# Patient Record
Sex: Male | Born: 1945 | Race: White | Hispanic: No | Marital: Married | Smoking: Never smoker
Health system: Southern US, Community
[De-identification: ages and names within clinical notes are randomized; demographics above are authoritative.]

## PROBLEM LIST (undated history)

## (undated) DIAGNOSIS — E039 Hypothyroidism, unspecified: Secondary | ICD-10-CM

## (undated) DIAGNOSIS — I2510 Atherosclerotic heart disease of native coronary artery without angina pectoris: Secondary | ICD-10-CM

## (undated) DIAGNOSIS — K219 Gastro-esophageal reflux disease without esophagitis: Secondary | ICD-10-CM

## (undated) DIAGNOSIS — I10 Essential (primary) hypertension: Secondary | ICD-10-CM

## (undated) DIAGNOSIS — M1990 Unspecified osteoarthritis, unspecified site: Secondary | ICD-10-CM

## (undated) DIAGNOSIS — E785 Hyperlipidemia, unspecified: Secondary | ICD-10-CM

## (undated) DIAGNOSIS — R42 Dizziness and giddiness: Secondary | ICD-10-CM

## (undated) DIAGNOSIS — F419 Anxiety disorder, unspecified: Secondary | ICD-10-CM

## (undated) DIAGNOSIS — M5126 Other intervertebral disc displacement, lumbar region: Secondary | ICD-10-CM

## (undated) DIAGNOSIS — E119 Type 2 diabetes mellitus without complications: Secondary | ICD-10-CM

## (undated) DIAGNOSIS — H269 Unspecified cataract: Secondary | ICD-10-CM

## (undated) HISTORY — DX: Anxiety disorder, unspecified: F41.9

## (undated) HISTORY — PX: KNEE ARTHROSCOPY: SUR90

## (undated) HISTORY — DX: Atherosclerotic heart disease of native coronary artery without angina pectoris: I25.10

## (undated) HISTORY — DX: Dizziness and giddiness: R42

## (undated) HISTORY — DX: Unspecified osteoarthritis, unspecified site: M19.90

## (undated) HISTORY — DX: Type 2 diabetes mellitus without complications: E11.9

## (undated) HISTORY — DX: Hyperlipidemia, unspecified: E78.5

## (undated) HISTORY — DX: Essential (primary) hypertension: I10

## (undated) HISTORY — PX: COLONOSCOPY: SHX174

## (undated) HISTORY — DX: Unspecified cataract: H26.9

## (undated) HISTORY — DX: Other intervertebral disc displacement, lumbar region: M51.26

## (undated) HISTORY — DX: Gastro-esophageal reflux disease without esophagitis: K21.9

## (undated) HISTORY — PX: POLYPECTOMY: SHX149

## (undated) HISTORY — PX: CATARACT EXTRACTION: SUR2

---

## 2005-09-26 ENCOUNTER — Encounter: Payer: Self-pay | Admitting: Cardiology

## 2008-09-28 ENCOUNTER — Encounter: Payer: Self-pay | Admitting: Cardiology

## 2008-10-28 ENCOUNTER — Ambulatory Visit: Payer: Self-pay | Admitting: Cardiology

## 2008-10-28 DIAGNOSIS — I10 Essential (primary) hypertension: Secondary | ICD-10-CM | POA: Insufficient documentation

## 2008-10-28 DIAGNOSIS — E7800 Pure hypercholesterolemia, unspecified: Secondary | ICD-10-CM

## 2008-10-28 DIAGNOSIS — E785 Hyperlipidemia, unspecified: Secondary | ICD-10-CM

## 2008-10-28 DIAGNOSIS — K219 Gastro-esophageal reflux disease without esophagitis: Secondary | ICD-10-CM

## 2008-10-28 DIAGNOSIS — R072 Precordial pain: Secondary | ICD-10-CM | POA: Insufficient documentation

## 2008-10-28 DIAGNOSIS — I2510 Atherosclerotic heart disease of native coronary artery without angina pectoris: Secondary | ICD-10-CM | POA: Insufficient documentation

## 2008-11-08 ENCOUNTER — Ambulatory Visit: Payer: Self-pay | Admitting: Cardiology

## 2008-11-08 LAB — CONVERTED CEMR LAB
BUN: 13 mg/dL (ref 6–23)
Calcium: 9.2 mg/dL (ref 8.4–10.5)
Creatinine, Ser: 1 mg/dL (ref 0.4–1.5)
Eosinophils Relative: 3 % (ref 0.0–5.0)
GFR calc non Af Amer: 79.79 mL/min (ref >60)
HCT: 41 % (ref 39.0–52.0)
INR: 1.1 — ABNORMAL HIGH (ref 0.8–1.0)
Lymphocytes Relative: 34.8 % (ref 12.0–46.0)
Monocytes Relative: 11 % (ref 3.0–12.0)
Neutrophils Relative %: 50.2 % (ref 43.0–77.0)
Platelets: 245 10*3/uL (ref 150.0–400.0)
Potassium: 4.1 meq/L (ref 3.5–5.1)
Prothrombin Time: 11.8 s (ref 10.9–13.3)
WBC: 5.6 10*3/uL (ref 4.5–10.5)

## 2008-11-11 ENCOUNTER — Ambulatory Visit: Payer: Self-pay | Admitting: Cardiology

## 2008-11-11 ENCOUNTER — Inpatient Hospital Stay (HOSPITAL_BASED_OUTPATIENT_CLINIC_OR_DEPARTMENT_OTHER): Admission: RE | Admit: 2008-11-11 | Discharge: 2008-11-11 | Payer: Self-pay | Admitting: Cardiology

## 2008-11-11 HISTORY — PX: CARDIAC CATHETERIZATION: SHX172

## 2008-11-30 ENCOUNTER — Ambulatory Visit: Payer: Self-pay | Admitting: Cardiology

## 2008-11-30 DIAGNOSIS — R42 Dizziness and giddiness: Secondary | ICD-10-CM

## 2008-12-14 ENCOUNTER — Ambulatory Visit: Payer: Self-pay | Admitting: Cardiology

## 2008-12-14 ENCOUNTER — Encounter (INDEPENDENT_AMBULATORY_CARE_PROVIDER_SITE_OTHER): Payer: Self-pay

## 2008-12-14 ENCOUNTER — Encounter: Payer: Self-pay | Admitting: Cardiology

## 2008-12-14 ENCOUNTER — Ambulatory Visit: Payer: Self-pay

## 2009-01-13 ENCOUNTER — Ambulatory Visit: Payer: Self-pay | Admitting: Cardiology

## 2010-08-11 ENCOUNTER — Inpatient Hospital Stay (HOSPITAL_COMMUNITY): Payer: Self-pay

## 2010-08-19 ENCOUNTER — Inpatient Hospital Stay (HOSPITAL_COMMUNITY): Payer: Self-pay

## 2010-08-28 LAB — POCT I-STAT 3, VENOUS BLOOD GAS (G3P V)
pCO2, Ven: 41.5 mmHg — ABNORMAL LOW (ref 45.0–50.0)
pH, Ven: 7.34 — ABNORMAL HIGH (ref 7.250–7.300)

## 2010-08-28 LAB — POCT I-STAT 3, ART BLOOD GAS (G3+)
Bicarbonate: 23.6 mEq/L (ref 20.0–24.0)
pH, Arterial: 7.383 (ref 7.350–7.450)
pO2, Arterial: 59 mmHg — ABNORMAL LOW (ref 80.0–100.0)

## 2010-10-03 NOTE — Cardiovascular Report (Signed)
NAMEHERBIE, LEHRMANN              ACCOUNT NO.:  [id number]   MEDICAL RECORD NO.:  [id number]          PATIENT TYPE:  OIB   LOCATION:  1962                         FACILITY:  [facility]   PHYSICIAN:  Peter Beals. 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  He had been evaluated in [place] for chest pain and had a coronary angiogram which showed mild  nonobstructive disease.  This was done in 2008.  He recently had  recurrent episodes of chest pain and he initially saw Dr. Edmonia James and  decided not proceed further evaluation at that time, but then had  recurrence of symptoms and came to [place] for further evaluation.   PROCEDURE:  The procedure was performed by the right femoral artery,  arterial sheath, and 4-French preformed coronary catheters.  We used an  FL-5 catheter for the left coronary artery.  The patient tolerated the  procedure well and left the laboratory in satisfactory condition.  Right  heart catheterization was performed percutaneously through right femoral  vein using a venous sheath and Swan-Ganz thermodilution catheter.   RESULTS:  The left main coronary artery:  The left main coronary artery  is free of significant disease.   The left anterior descending artery:  The left anterior descending  artery gave rise to 2 diagonal branches and a septal perforator.  The  LAD was irregular and there was 40% narrowing in the proximal vessel and  30% narrowing in the mid vessel.  The vessel terminated before the apex.   The circumflex artery:  The circumflex artery is a fairly large vessel  gave rise to a small marginal branch, an atrial branch, a second  marginal branch, and 2 posterolateral branches.  This vessel was  irregular, but has no major  obstruction.   The right coronary artery:  The right coronary artery is a moderate-  sized vessel gave rise to a right ventricular branch, a posterior  descending branch, and a posterolateral branch.  This vessel was  irregular, but had no significant obstruction.   The left ventriculogram:  The left ventriculogram performed in the RAO  projection showed good wall motion with no areas of hypokinesis.  Estimated ejection fraction of 60%.   HEMODYNAMIC DATA:  The right atrial pressure was 4 mean.  The pulmonary  artery pressure was 27/9 with a mean of 16.  The pulmonary wedge  pressure was 9 mean.  Left ventricular pressure was 122/13.  The aortic  pressure was 122/68 with a mean of 90.  Cardiac output/cardiac index was  6.9/2.8 L/min/m2 by Fick.   CONCLUSIONS:  1. Nonobstructive coronary artery disease with 40% narrowing in the      proximal and 30% narrowing in the mid LAD, irregularities in the      circumflex and right coronary artery, and normal LV function.  2. Normal right-sided pressures.   RECOMMENDATIONS:  The patient has  no source of ischemia.  The etiology  of his recent chest pain and shortness breath is not clear.  We may  consider evaluate him further for pulmonary embolism in view of his  embolism since his symptoms were fairly impressive.  I will also query  him regarding symptoms of reflux to see if this might be a possibility  and we will consider whether we should give a trial of PPI.  I will see  him back in follow-up in a couple of weeks.      Bruce Elvera Lennox Juanda Chance, MD, [facility]  Electronically Signed     BRB/MEDQ  D:  11/11/2008  T:  11/12/2008  Job:  [id number]   cc:   Francisca December, M.D.

## 2011-08-08 ENCOUNTER — Observation Stay (HOSPITAL_COMMUNITY)
Admission: AD | Admit: 2011-08-08 | Discharge: 2011-08-09 | DRG: 313 | Disposition: A | Discharge status: Home / Self Care | Payer: Medicare Other | Source: Other Acute Inpatient Hospital | Attending: Cardiology | Admitting: Cardiology

## 2011-08-08 DIAGNOSIS — K219 Gastro-esophageal reflux disease without esophagitis: Secondary | ICD-10-CM | POA: Insufficient documentation

## 2011-08-08 DIAGNOSIS — E782 Mixed hyperlipidemia: Secondary | ICD-10-CM | POA: Insufficient documentation

## 2011-08-08 DIAGNOSIS — I10 Essential (primary) hypertension: Secondary | ICD-10-CM | POA: Insufficient documentation

## 2011-08-08 DIAGNOSIS — I2510 Atherosclerotic heart disease of native coronary artery without angina pectoris: Secondary | ICD-10-CM | POA: Insufficient documentation

## 2011-08-08 DIAGNOSIS — R0789 Other chest pain: Principal | ICD-10-CM | POA: Insufficient documentation

## 2011-08-08 DIAGNOSIS — R42 Dizziness and giddiness: Secondary | ICD-10-CM | POA: Insufficient documentation

## 2011-08-08 DIAGNOSIS — R079 Chest pain, unspecified: Secondary | ICD-10-CM | POA: Diagnosis present

## 2011-08-08 DIAGNOSIS — E7800 Pure hypercholesterolemia, unspecified: Secondary | ICD-10-CM | POA: Insufficient documentation

## 2011-08-08 HISTORY — DX: Hypothyroidism, unspecified: E03.9

## 2011-08-09 ENCOUNTER — Other Ambulatory Visit: Payer: Self-pay

## 2011-08-09 ENCOUNTER — Encounter (HOSPITAL_COMMUNITY): Payer: Self-pay | Admitting: *Deleted

## 2011-08-09 DIAGNOSIS — R079 Chest pain, unspecified: Secondary | ICD-10-CM

## 2011-08-09 DIAGNOSIS — R42 Dizziness and giddiness: Secondary | ICD-10-CM

## 2011-08-09 LAB — PROTIME-INR: Prothrombin Time: 15.1 seconds (ref 11.6–15.2)

## 2011-08-09 LAB — CARDIAC PANEL(CRET KIN+CKTOT+MB+TROPI)
CK, MB: 1.8 ng/mL (ref 0.3–4.0)
CK, MB: 1.8 ng/mL (ref 0.3–4.0)
Total CK: 82 U/L (ref 7–232)
Troponin I: <0.3 ng/mL (ref <0.30)

## 2011-08-09 LAB — LIPID PANEL
Cholesterol: 129 mg/dL (ref 0–200)
LDL Cholesterol: 72 mg/dL (ref 0–99)
Total CHOL/HDL Ratio: 3.6 RATIO
Triglycerides: 104 mg/dL (ref <150)
VLDL: 21 mg/dL (ref 0–40)

## 2011-08-09 LAB — BASIC METABOLIC PANEL
CO2: 24 mEq/L (ref 19–32)
GFR calc non Af Amer: 89 mL/min — ABNORMAL LOW (ref >90)
Glucose, Bld: 117 mg/dL — ABNORMAL HIGH (ref 70–99)

## 2011-08-09 LAB — CBC
Hemoglobin: 13.7 g/dL (ref 13.0–17.0)
MCH: 29 pg (ref 26.0–34.0)
MCV: 87.1 fL (ref 78.0–100.0)
Platelets: 229 10*3/uL (ref 150–400)
RBC: 4.73 MIL/uL (ref 4.22–5.81)
WBC: 7.6 10*3/uL (ref 4.0–10.5)

## 2011-08-09 MED ORDER — ACETAMINOPHEN 325 MG PO TABS: 650.0000 mg | ORAL_TABLET | Freq: Four times a day (QID) | ORAL | Status: DC | PRN

## 2011-08-09 MED ORDER — LEVOTHYROXINE SODIUM 50 MCG PO TABS
50.0000 ug | ORAL_TABLET | Freq: Every day | ORAL | Status: DC
  Administered 2011-08-09: 50 ug via ORAL
  Filled 2011-08-09 (×2): qty 1

## 2011-08-09 MED ORDER — SODIUM CHLORIDE 0.9 % IJ SOLN
3.0000 mL | Freq: Two times a day (BID) | INTRAMUSCULAR | Status: DC
  Administered 2011-08-09 (×2): 3 mL via INTRAVENOUS

## 2011-08-09 MED ORDER — SIMVASTATIN 40 MG PO TABS
40.0000 mg | ORAL_TABLET | Freq: Every day | ORAL | Status: DC
  Filled 2011-08-09: qty 1

## 2011-08-09 MED ORDER — SODIUM CHLORIDE 0.9 % IV SOLN
INTRAVENOUS | Status: AC
  Administered 2011-08-09: 01:00:00 via INTRAVENOUS

## 2011-08-09 MED ORDER — ONDANSETRON HCL 4 MG/2ML IJ SOLN: 4.0000 mg | Freq: Four times a day (QID) | INTRAMUSCULAR | Status: DC | PRN

## 2011-08-09 MED ORDER — ASPIRIN EC 81 MG PO TBEC
81.0000 mg | DELAYED_RELEASE_TABLET | Freq: Every day | ORAL | Status: DC
  Administered 2011-08-09: 81 mg via ORAL
  Filled 2011-08-09: qty 1

## 2011-08-09 MED ORDER — ENOXAPARIN SODIUM 40 MG/0.4ML ~~LOC~~ SOLN
40.0000 mg | SUBCUTANEOUS | Status: DC
  Filled 2011-08-09: qty 0.4

## 2011-08-09 NOTE — Discharge Summary (Signed)
See note from this am. cdm

## 2011-08-09 NOTE — Progress Notes (Signed)
08/09/2011 10:53 AM Nursing Note Discharge avs form, medications already taken today and those due this evening given and explained to patient and family member. Follow up appointments and when to cal the doctor discussed. D/v iv line. D/c tele. D/c home per orders.  Rhyann Berton, Blanchard Kelch

## 2011-08-09 NOTE — Progress Notes (Signed)
    SUBJECTIVE: No chest pain or SOB. No events. No dizziness. NSR on tele.  BP 103/62  Pulse 62  Temp(Src) 97.1 F (36.2 C) (Oral)  Resp 20  Ht 6\' 1"  (1.854 m)  Wt 243 lb 11.2 oz (110.542 kg)  BMI 32.15 kg/m2  SpO2 95%  Intake/Output Summary (Last 24 hours) at 08/09/11 0730 Last data filed at 08/09/11 0030  Gross per 24 hour  Intake      3 ml  Output      0 ml  Net      3 ml    PHYSICAL EXAM General: Well developed, well nourished, in no acute distress. Alert and oriented x 3.  Psych:  Good affect, responds appropriately Neck: No JVD. No masses noted.  Lungs: Clear bilaterally with no wheezes or rhonci noted.  Heart: RRR with no murmurs noted. Abdomen: Bowel sounds are present. Soft, non-tender.  Extremities: No lower extremity edema.   LABS: BMET, CBC reviewed and WNL  Cardiac Enzymes:  Basename 08/09/11 0055  CKTOTAL 82  CKMB 1.8  CKMBINDEX --  TROPONINI <0.30     Current Meds:    . aspirin EC  81 mg Oral Daily  . enoxaparin  40 mg Subcutaneous Q24H  . levothyroxine  50 mcg Oral QAC breakfast  . simvastatin  40 mg Oral q1800  . sodium chloride  3 mL Intravenous Q12H     ASSESSMENT AND PLAN:  1. Chest pain: Atypical. Cardiac enzymes negative. Pt has history of mild CAD by cath in 2010. I do not think this represents unstable angina.  He has had similar episodes of dizziness in 2010 at which time event monitor showed only sinus rhythm. Echo at that time with normal LV function, no valvular disease and only mild left atrial enlargement. He feels well this am. No dizziness. Will get breakfast tray, ambulate. D/C home later in am if ok. He can f/u with me in 2-3 weeks.   2. HTN: BP better this am. ? Elevated yesterday due to stress. Will not start anti-hypertensive agent at this time. He will follow at home several times daily and let me know if elevated.   Joh Rao  3/21/20137:30 AM

## 2011-08-09 NOTE — Discharge Summary (Signed)
CARDIOLOGY DISCHARGE SUMMARY   Patient ID: Peter Brooks MRN: [id number] DOB/AGE: 08/02/1945 66 y.o.  Admit date: 08/08/2011 Discharge date: 08/09/2011  Primary Discharge Diagnosis:  Lightheadedness and chest pain Secondary Discharge Diagnosis:  Patient Active Problem List  Diagnoses  . HYPERCHOLESTEROLEMIA  . HYPERLIPIDEMIA-MIXED  . HYPERTENSION, BENIGN  . HYPERTENSION  . CAD  . GERD  . DIZZINESS  . CHEST PAIN-PRECORDIAL  . Chest pain   Hospital Course: Mr Serena is a 66 year old male with a history of non-obstructive CAD by cath in 2010. He had 2 episodes of light-headedness as well as some chest pain. He went to [facility] where he was treated with ASA, NTG and BB. A head CT was negative. He was transferred to [facility] for further evaluation and treatment.  His cardiac enzymes were negative for MI. He had no significant arrhythmias overnight. He had no other significant lab abnormalities. He had no more lightheadedness.   On 08/09/2011, he was evaluated by Dr Clifton James. If he ambulates without difficulty, he is considered stable for discharge, to follow up as an outpatient.  Labs:   Lab Results  Component Value Date   WBC 7.6 08/09/2011   HGB 13.7 08/09/2011   HCT 41.2 08/09/2011   MCV 87.1 08/09/2011   PLT 229 08/09/2011    Lab 08/09/11 0710  NA 140  K 4.2  CL 106  CO2 24  BUN 12  CREATININE 0.83  CALCIUM 9.5  PROT --  BILITOT --  ALKPHOS --  ALT --  AST --  GLUCOSE 117*    Basename 08/09/11 0710 08/09/11 0055  CKTOTAL 111 82  CKMB 1.8 1.8  CKMBINDEX -- --  TROPONINI <0.30 <0.30   Lipid Panel     Component Value Date/Time   CHOL 129 08/09/2011 0710   TRIG 104 08/09/2011 0710   HDL 36* 08/09/2011 0710   CHOLHDL 3.6 08/09/2011 0710   VLDL 21 08/09/2011 0710   LDLCALC 72 08/09/2011 0710    Basename 08/09/11 0710  INR 1.17     EKG:09-Aug-2011 05:30:20  Normal sinus rhythm RSR' or QR pattern in V1 suggests right ventricular conduction  delay Vent. rate 63 BPM PR interval 170 ms QRS duration 124 ms QT/QTc 448/458 ms P-R-T axes 48 -15 33  FOLLOW UP PLANS AND APPOINTMENTS Discharge Orders    Future Appointments: Provider: Department: Dept Phone: Center:   08/22/2011 10:10 AM Beatrice Lecher, PA [facility] [phone] [facility]     Future Orders Please Complete By Expires   Diet - low sodium heart healthy      Increase activity slowly        No Known Allergies Medication List  As of 08/09/2011  9:42 AM   TAKE these medications         aspirin EC 81 MG tablet   Take 81 mg by mouth daily.      CALCIUM/C/D PO   Take 1 tablet by mouth daily.      levothyroxine 50 MCG tablet   Commonly known as: SYNTHROID, LEVOTHROID   Take 50 mcg by mouth daily.      mulitivitamin with minerals Tabs   Take 1 tablet by mouth daily.      omeprazole 20 MG capsule   Commonly known as: PRILOSEC   Take 20 mg by mouth daily.      simvastatin 20 MG tablet   Commonly known as: ZOCOR   Take 20 mg by mouth every evening.  Follow-up Information    Follow up with Tereso Newcomer, PA. (See for Dr Clifton James on April 3rd at  10:10 am)    Contact information:   [address] [place] [address] [phone]          BRING ALL MEDICATIONS WITH YOU TO FOLLOW UP APPOINTMENTS  Time spent with patient to include physician time: 33 min Signed: Theodore Demark 08/09/2011, 9:42 AM Co-Sign MD

## 2011-08-09 NOTE — H&P (Signed)
Cardiology History and Physical  No primary provider on file.  History of Present Illness (and review of medical records): Peter Brooks is a 66 y.o. male who presents for evaluation of chest pain as a transfer from [facility].  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  He had no frank syncope.  He took BP at home which was elevated.  He is usually normotensive and not treated for HTN.  He also reported chest pain which he describes as twinges on left side of chest.  This has been going on intermittently for several months now.  No alleviation or aggravating factors.  Pain is very sporadic and lasting few seconds.  At [facility] ER he had neg trp, and given ASA, NTG, and lopressor for SBP 190s.  He also had CT head which was negative prior to transfer for further management.  Previous diagnostic testing for coronary artery disease includes:  Cardiac Cath 2010 1. Nonobstructive coronary artery disease with 40% narrowing in the  proximal and 30% narrowing in the mid LAD, irregularities in the  circumflex and right coronary artery, and normal LV function.  2. Normal right-sided pressures.   Review of Systems Further review of systems was otherwise negative other than stated in HPI.  Patient Active Problem List  Diagnoses Date Noted  . Chest pain 08/09/2011  . DIZZINESS 11/30/2008  . HYPERCHOLESTEROLEMIA 10/28/2008  . HYPERLIPIDEMIA-MIXED 10/28/2008  . HYPERTENSION, BENIGN 10/28/2008  . HYPERTENSION 10/28/2008  . CAD 10/28/2008  . GERD 10/28/2008  . CHEST PAIN-PRECORDIAL 10/28/2008   No past medical history on file.  No past surgical history on file.  No prescriptions prior to admission   Allergies not on file  History  Substance Use Topics  . Smoking status: Not on file  . Smokeless tobacco: Not on file  . Alcohol Use: Not on file    No family history on file.   Objective: Blood pressure 103/62, pulse 62, temperature 97.1 F  (36.2 C), temperature source Oral, resp. rate 20, height 6\' 1"  (1.854 m), weight 110.542 kg (243 lb 11.2 oz), SpO2 95.00%.  General Appearance:    Alert, cooperative, no distress, appears stated age  Head:    Normocephalic, without obvious abnormality, atraumatic  Eyes:     PERRL, EOMI, anicteric sclerae  Neck:   Supple, no carotid bruit or JVD  Lungs:     Clear to auscultation bilaterally, respirations unlabored  Heart:    Regular rate and rhythm, S1 and S2 normal, no murmur  Abdomen:     Soft, non-tender, normoactive bowel sounds  Extremities:   Extremities normal, atraumatic, no cyanosis or edema  Pulses:   2+ and symmetric all extremities  Skin:   no rashes or lesions  Neurologic:   No focal deficits. AAO x3  ECG:   Sinus rhythm HR 72, nonspecific intraventricular block, no significant change from prior  Assessment: Chest pain, atypical in 71 male with known nonobstructive ASCAD as of 2010.   Plan:  1. Admit to Cardiology, Telemetry Unit 2. Repeat ekg on admit, prn chest pain or arrythmia 3. Trend cardiac biomarkers, check lipids, hgba1c, tsh 4. Medical management to include ASA, Statin, NTG prn, Add BB if needed for BP 5. NPO for possible stress in am if enzymes negative, patient agreeable to proceed as planned

## 2011-08-09 NOTE — Progress Notes (Signed)
   CARE MANAGEMENT NOTE 08/09/2011  Patient:  Peter Brooks, Peter Brooks   Account Number:  [id number]  Date Initiated:  08/09/2011  Documentation initiated by:  GRAVES-BIGELOW,Drena Ham  Subjective/Objective Assessment:   Pt admitted with cp and lightheadedness. No needs assessed by CM at this time. Pt d/c home no needs assessed.     Action/Plan:   Anticipated DC Date:  08/09/2011   Anticipated DC Plan:  HOME/SELF CARE         Choice offered to / List presented to:             Status of service:  Completed, signed off Medicare Important Message given?   (If response is "NO", the following Medicare IM given date fields will be blank) Date Medicare IM given:   Date Additional Medicare IM given:    Discharge Disposition:  HOME/SELF CARE  Per UR Regulation:    If discussed at Long Length of Stay Meetings, dates discussed:    Comments:

## 2011-08-20 ENCOUNTER — Encounter: Payer: Self-pay | Admitting: *Deleted

## 2011-08-22 ENCOUNTER — Ambulatory Visit (INDEPENDENT_AMBULATORY_CARE_PROVIDER_SITE_OTHER): Payer: Medicare Other | Admitting: Physician Assistant

## 2011-08-22 ENCOUNTER — Encounter: Payer: Self-pay | Admitting: Physician Assistant

## 2011-08-22 VITALS — BP 132/87 | HR 62 | Resp 18 | Ht 73.0 in | Wt 253.8 lb

## 2011-08-22 DIAGNOSIS — E785 Hyperlipidemia, unspecified: Secondary | ICD-10-CM

## 2011-08-22 DIAGNOSIS — R0683 Snoring: Secondary | ICD-10-CM

## 2011-08-22 DIAGNOSIS — R42 Dizziness and giddiness: Secondary | ICD-10-CM

## 2011-08-22 DIAGNOSIS — R0609 Other forms of dyspnea: Secondary | ICD-10-CM

## 2011-08-22 DIAGNOSIS — I2510 Atherosclerotic heart disease of native coronary artery without angina pectoris: Secondary | ICD-10-CM

## 2011-08-22 NOTE — Progress Notes (Signed)
[address] [place], [place]  [address] Phone: [phone] Fax:  [phone]  Date:  08/22/2011   Name:  Peter Brooks       DOB:  February 13, 1946 MRN:  [id number]  PCP:  Dr. Sheria Lang Primary Cardiologist:  Dr. Verne Carrow  Primary Electrophysiologist:  None    History of Present Illness: Peter Brooks is a 66 y.o. male who presents for post hospital follow up.  He has a h/o non-obs CAD, HTN, HLP, GERD.  LHC 10/2008: pLAD 40%, mLAD 30%, irregs in CFX and RCA, EF 60%, normal R heart pressures.  Echo 11/2008: Ef 60-65%, grade 1 diast dysfxn, mild LAE, PASp 29, neg bubble study.  Eval by Dr Juanda Chance in 2010 and event monitor demonstrated NSR.  He was admx 3/20-3/21 with CP and dizziness.  Head CT at [facility] was neg.  He ruled out for MI.  He had no arrhythmias on tele.  He saw Dr. Verne Carrow who did not feel this represented [place].  Follow up as an OP was recommended.  Labs: Hgb 13.7, K 4.2, creatinine 0.83, TC 129, TG 104, HDL 36, LDL 72.    He is doing well.  No further CP.  He has dyspnea with more extreme activities.  No orthopnea, PND or edema.  He has noted more dizziness.  He has had no further symptoms like his presentation to the hospital.  He describes as lightheadedness and loss of balance at times (? Spinning).  No syncope or near syncope.  Brings in a list of BPs from home.  Majority of BPs are optimal.  Lowest reading was 104/66 and highest was 150/86.    Past Medical History  Diagnosis Date  . Hypothyroidism   . Hyperlipidemia   . GERD (gastroesophageal reflux disease)   . Lumbar herniated disc   . CAD (coronary artery disease)     nonobstructive CAD with 40% narrowing in the proximal LAD   . Chest pain   . Dizziness   . Hypertension     Current Outpatient Prescriptions  Medication Sig Dispense Refill  . aspirin EC 81 MG tablet Take 81 mg by mouth daily.      . Calcium-Vitamins C & D (CALCIUM/C/D PO) Take 1 tablet by mouth  daily.      [name] Kitchen levothyroxine (SYNTHROID, LEVOTHROID) 50 MCG tablet Take 50 mcg by mouth daily.      . Multiple Vitamin (MULITIVITAMIN WITH MINERALS) TABS Take 1 tablet by mouth daily.      [name] Kitchen omeprazole (PRILOSEC) 20 MG capsule Take 20 mg by mouth daily.      . simvastatin (ZOCOR) 20 MG tablet Take 20 mg by mouth every evening.        Allergies: No Known Allergies  History  Substance Use Topics  . Smoking status: Never Smoker   . Smokeless tobacco: Never Used  . Alcohol Use: No     ROS:  Please see the history of present illness.   Denies sinus congestion, URI symptoms, positional changes.  He does snore and his wife has witnessed apneic episodes.  But this has improved with weight loss.  All other systems reviewed and negative.   PHYSICAL EXAM: VS:  BP 132/87  Pulse 62  Resp 18  Ht 6\' 1"  (1.854 m)  Wt 253 lb 12.8 oz (115.123 kg)  BMI 33.48 kg/m2 Well nourished, well developed, in no acute distress HEENT: normal Neck: no JVD Vasc: no carotid bruits Cardiac:  normal S1,  S2; RRR; no murmur Lungs:  clear to auscultation bilaterally, no wheezing, rhonchi or rales Abd: soft, nontender, no hepatomegaly Ext: no edema Skin: warm and dry Neuro:  CNs 2-12 intact, no focal abnormalities noted  EKG:  Sinus brady, HR 59, inc RBBB, no change from prior  ASSESSMENT AND PLAN:  1. Dizziness  Etiology not clear.  I am suspicious of BPPV with an atypical presentation.  Event monitor in 2010 was unrevealing.  His symptoms now are a little different.  He does not recall having an episode of dizziness while wearing the monitor in 2010.  I think it is worthwhile to repeat this.  Will schedule an event monitor.   There is no clear reason to repeat his echo at this time.  I will refer him to ENT to rule out other causes.  BPs at home have been ok.  Follow up with Dr. Verne Carrow in 6 weeks.    2. CAD  No chest pain.  Continue ASA.   3. HYPERLIPIDEMIA-MIXED  Managed by PCP.    4.  Snoring  He may have OSA.  Symptoms have improved with weight loss.  Monitor for now.  Consider referral for sleep testing in the future.      Signed, Tereso Newcomer, PA-C  10:39 AM 08/22/2011

## 2011-08-22 NOTE — Patient Instructions (Addendum)
Your physician recommends that you schedule a follow-up appointment in: 6 weeks with Dr Clifton James Your physician has recommended that you wear an event monitor. Event monitors are medical devices that record the heart's electrical activity. Doctors most often [place] these monitors to diagnose arrhythmias. Arrhythmias are problems with the speed or rhythm of the heartbeat. The monitor is a small, portable device. You can wear one while you do your normal daily activities. This is usually used to diagnose what is causing palpitations/syncope (passing out).  You have been referred to ENT (Dr Suszanne Conners)

## 2011-10-04 ENCOUNTER — Encounter: Payer: Self-pay | Admitting: Cardiovascular Disease

## 2011-10-04 ENCOUNTER — Ambulatory Visit (INDEPENDENT_AMBULATORY_CARE_PROVIDER_SITE_OTHER): Payer: Medicare Other | Admitting: Cardiovascular Disease

## 2011-10-04 VITALS — BP 128/86 | HR 64 | Ht 73.5 in | Wt 251.1 lb

## 2011-10-04 DIAGNOSIS — R5381 Other malaise: Secondary | ICD-10-CM

## 2011-10-04 DIAGNOSIS — R5383 Other fatigue: Secondary | ICD-10-CM

## 2011-10-04 DIAGNOSIS — I2510 Atherosclerotic heart disease of native coronary artery without angina pectoris: Secondary | ICD-10-CM

## 2011-10-04 NOTE — Patient Instructions (Signed)

## 2011-10-04 NOTE — Assessment & Plan Note (Signed)
STable. I do not think his episodes of dizziness are related to ischemia. Will continue current therapy. I will arrange an echo to assess LV function. If he has further palpitations, he will call us to arrange an event monitor. He is encouraged to drink plenty of water daily.

## 2011-10-04 NOTE — Progress Notes (Signed)
History of Present Illness: 66 y.o. WM with history of CAD, HTN, HLD, GERD here today for cardiac follow up. He has been followed in the past who presents for post hospital follow up. He has a h/o non-obs CAD, HTN, HLP, GERD. LHC 10/2008: pLAD 40%, mLAD 30%, irregs in CFX and RCA, EF 60%, normal R heart pressures. Echo 11/2008: Ef 60-65%, grade 1 diast dysfxn, mild LAE, PASp 29, neg bubble study. Eval by Dr Juanda Chance in 2010 and event monitor demonstrated NSR. He was admx 3/20-3/21/13 with dizziness. Head CT at [facility] was neg. He was transferred to the cardiology service at [facility] where he ruled out for MI. He had no arrhythmias on tele. I saw him and released him the next morning. He saw Tereso Newcomer, PA-C for hospital follow up in April and was doing well. An event monitor was recommended but he decided not to wear this. He was vacuuming last month and had one episode of dizziness with diaphoresis and pounding of his heart. His BP was 98/80. He sat down and felt better. He is doing well since then. No further CP.  No orthopnea, PND or edema.   Primary Care Physician: Desmond Dike  Last Lipid Profile:  Lipid Panel     Component Value Date/Time   CHOL 129 08/09/2011 0710   TRIG 104 08/09/2011 0710   HDL 36* 08/09/2011 0710   CHOLHDL 3.6 08/09/2011 0710   VLDL 21 08/09/2011 0710   LDLCALC 72 08/09/2011 0710     Past Medical History  Diagnosis Date  . Hypothyroidism   . Hyperlipidemia   . GERD (gastroesophageal reflux disease)   . Lumbar herniated disc   . CAD (coronary artery disease)     nonobstructive CAD with 40% narrowing in the proximal LAD   . Chest pain   . Dizziness   . Hypertension     Past Surgical History  Procedure Date  . Cardiac catheterization 11/11/2008    Current Outpatient Prescriptions  Medication Sig Dispense Refill  . aspirin EC 81 MG tablet Take 81 mg by mouth daily.      . calcium carbonate (OS-CAL) 600 MG TABS Take 600 mg by mouth daily.      [name] Kitchen levothyroxine  (SYNTHROID, LEVOTHROID) 50 MCG tablet Take 50 mcg by mouth daily.      . Multiple Vitamin (MULITIVITAMIN WITH MINERALS) TABS Take 1 tablet by mouth daily.      [name] Kitchen omeprazole (PRILOSEC) 20 MG capsule Take 20 mg by mouth daily.      . simvastatin (ZOCOR) 20 MG tablet Take 20 mg by mouth every evening.      . vitamin C (ASCORBIC ACID) 500 MG tablet Take 500 mg by mouth daily.        No Known Allergies  History   Social History  . Marital Status: Married    Spouse Name: N/A    Number of Children: N/A  . Years of Education: N/A   Occupational History  . Not on file.   Social History Main Topics  . Smoking status: Never Smoker   . Smokeless tobacco: Never Used  . Alcohol Use: No  . Drug Use: No  . Sexually Active: Yes     married   Other Topics Concern  . Not on file   Social History Narrative  . No narrative on file    Family History  Problem Relation Age of Onset  . Coronary artery disease Father     with CABG valve  replacement in his 73's  . Heart attack Mother     in her 61's    Review of Systems:  As stated in the HPI and otherwise negative.   BP 128/86  Pulse 64  Ht 6' 1.5" (1.867 m)  Wt 251 lb 1.9 oz (113.907 kg)  BMI 32.68 kg/m2  Physical Examination: General: Well developed, well nourished, NAD HEENT: OP clear, mucus membranes moist SKIN: warm, dry. No rashes. Neuro: No focal deficits Musculoskeletal: Muscle strength 5/5 all ext Psychiatric: Mood and affect normal Neck: No JVD, no carotid bruits, no thyromegaly, no lymphadenopathy. Lungs:Clear bilaterally, no wheezes, rhonci, crackles Cardiovascular: Regular rate and rhythm. No murmurs, gallops or rubs. Abdomen:Soft. Bowel sounds present. Non-tender.  Extremities: No lower extremity edema. Pulses are 2 + in the bilateral DP/PT.

## 2011-11-05 ENCOUNTER — Ambulatory Visit (HOSPITAL_COMMUNITY): Payer: Medicare Other | Attending: Cardiology

## 2011-11-05 DIAGNOSIS — R5381 Other malaise: Secondary | ICD-10-CM | POA: Insufficient documentation

## 2011-11-05 DIAGNOSIS — R5383 Other fatigue: Secondary | ICD-10-CM

## 2011-11-05 DIAGNOSIS — R42 Dizziness and giddiness: Secondary | ICD-10-CM | POA: Insufficient documentation

## 2011-11-05 DIAGNOSIS — I10 Essential (primary) hypertension: Secondary | ICD-10-CM | POA: Insufficient documentation

## 2011-11-05 DIAGNOSIS — I517 Cardiomegaly: Secondary | ICD-10-CM | POA: Insufficient documentation

## 2011-11-05 DIAGNOSIS — I2510 Atherosclerotic heart disease of native coronary artery without angina pectoris: Secondary | ICD-10-CM | POA: Insufficient documentation

## 2011-11-05 DIAGNOSIS — K219 Gastro-esophageal reflux disease without esophagitis: Secondary | ICD-10-CM | POA: Insufficient documentation

## 2011-11-05 DIAGNOSIS — E785 Hyperlipidemia, unspecified: Secondary | ICD-10-CM | POA: Insufficient documentation

## 2011-11-05 DIAGNOSIS — R079 Chest pain, unspecified: Secondary | ICD-10-CM | POA: Insufficient documentation

## 2011-11-05 NOTE — Progress Notes (Signed)
Echocardiogram performed.  

## 2011-11-08 ENCOUNTER — Telehealth: Payer: Self-pay | Admitting: Cardiovascular Disease

## 2011-11-08 NOTE — Telephone Encounter (Signed)
PT RTN PAT'S CALL FROM YESTERDAY, PLS CALL

## 2011-11-08 NOTE — Telephone Encounter (Signed)
Patient aware of echo results.

## 2012-06-03 ENCOUNTER — Ambulatory Visit: Payer: Medicare Other | Admitting: Cardiovascular Disease

## 2012-10-30 ENCOUNTER — Ambulatory Visit: Payer: Medicare Other | Admitting: Cardiovascular Disease

## 2012-11-03 ENCOUNTER — Ambulatory Visit: Payer: Medicare Other | Admitting: Cardiovascular Disease

## 2012-11-24 ENCOUNTER — Ambulatory Visit: Payer: Medicare Other | Admitting: Cardiovascular Disease

## 2012-12-25 ENCOUNTER — Ambulatory Visit (INDEPENDENT_AMBULATORY_CARE_PROVIDER_SITE_OTHER): Payer: Medicare Other | Admitting: Cardiovascular Disease

## 2012-12-25 ENCOUNTER — Encounter: Payer: Self-pay | Admitting: Cardiovascular Disease

## 2012-12-25 VITALS — BP 132/80 | HR 63 | Ht 73.5 in | Wt 267.0 lb

## 2012-12-25 DIAGNOSIS — I25810 Atherosclerosis of coronary artery bypass graft(s) without angina pectoris: Secondary | ICD-10-CM

## 2012-12-25 NOTE — Progress Notes (Signed)
History of Present Illness: 67 y.o. WM with history of CAD, HTN, HLD, GERD here today for cardiac follow up.  He has a h/o non-obsructive CAD, HTN, HLP, GERD. LHC 10/2008: pLAD 40%, mLAD 30%, irregs in CFX and RCA, EF 60%, normal R heart pressures. Echo 11/2008: Ef 60-65%, grade 1 diast dysfxn, mild LAE, PASp 29, neg bubble study. Eval by Dr Juanda Chance in 2010 and event monitor demonstrated NSR. He was admx 3/20-3/21/13 with dizziness. Head CT at [facility] was neg. He was transferred to the cardiology service at [facility] where he ruled out for MI. He had no arrhythmias on telemetry. Echo June 2013 with normal LV size and function, no valve issues.   He is here today for cardiac follow up. No chest pain or SOB. He does note diaphoresis while at rest. No palpitations. He is active. He walks 3-4 days per week with no symptoms.   Primary Care Physician: Desmond Dike  Last Lipid Profile:Lipid Panel     Component Value Date/Time   CHOL 129 08/09/2011 0710   TRIG 104 08/09/2011 0710   HDL 36* 08/09/2011 0710   CHOLHDL 3.6 08/09/2011 0710   VLDL 21 08/09/2011 0710   LDLCALC 72 08/09/2011 0710     Past Medical History  Diagnosis Date  . Hypothyroidism   . Hyperlipidemia   . GERD (gastroesophageal reflux disease)   . Lumbar herniated disc   . CAD (coronary artery disease)     nonobstructive CAD with 40% narrowing in the proximal LAD   . Chest pain   . Dizziness   . Hypertension     Past Surgical History  Procedure Laterality Date  . Cardiac catheterization  11/11/2008    Current Outpatient Prescriptions  Medication Sig Dispense Refill  . aspirin EC 81 MG tablet Take 81 mg by mouth daily.      . calcium carbonate (OS-CAL) 600 MG TABS Take 600 mg by mouth daily.      [name] Kitchen levothyroxine (SYNTHROID, LEVOTHROID) 50 MCG tablet Take 50 mcg by mouth daily.      . Multiple Vitamin (MULITIVITAMIN WITH MINERALS) TABS Take 1 tablet by mouth daily.      [name] Kitchen NIACIN PO Take by mouth daily. Flash Free      .  simvastatin (ZOCOR) 20 MG tablet Take 20 mg by mouth every evening.      . vitamin C (ASCORBIC ACID) 500 MG tablet Take 500 mg by mouth daily.       No current facility-administered medications for this visit.    No Known Allergies  History   Social History  . Marital Status: Married    Spouse Name: N/A    Number of Children: N/A  . Years of Education: N/A   Occupational History  . Not on file.   Social History Main Topics  . Smoking status: Never Smoker   . Smokeless tobacco: Never Used  . Alcohol Use: No  . Drug Use: No  . Sexually Active: Yes     Comment: married   Other Topics Concern  . Not on file   Social History Narrative  . No narrative on file    Family History  Problem Relation Age of Onset  . Coronary artery disease Father     with CABG valve replacement in his 58's  . Heart attack Mother     in her 67's    Review of Systems:  As stated in the HPI and otherwise negative.   BP 132/80  Pulse  63  Ht 6' 1.5" (1.867 m)  Wt 267 lb (121.11 kg)  BMI 34.74 kg/m2  Physical Examination: General: Well developed, well nourished, NAD HEENT: OP clear, mucus membranes moist SKIN: warm, dry. No rashes. Neuro: No focal deficits Musculoskeletal: Muscle strength 5/5 all ext Psychiatric: Mood and affect normal Neck: No JVD, no carotid bruits, no thyromegaly, no lymphadenopathy. Lungs:Clear bilaterally, no wheezes, rhonci, crackles Cardiovascular: Regular rate and rhythm. No murmurs, gallops or rubs. Abdomen:Soft. Bowel sounds present. Non-tender.  Extremities: No lower extremity edema. Pulses are 2 + in the bilateral DP/PT.  EKG: NSR, LVH. Rate 63 bpm.   Echo June 2013: Left ventricle: The cavity size was normal. Wall thickness was increased in a pattern of mild LVH. There was mild focal basal hypertrophy of the septum. Systolic function was normal. The estimated ejection fraction was in the range of 55% to 65%. Wall motion was normal; there were  no regional wall motion abnormalities. Doppler parameters are consistent with abnormal left ventricular relaxation (grade 1 diastolic dysfunction). - Left atrium: The atrium was mildly dilated.  Assessment and Plan:   1. CAD:

## 2012-12-25 NOTE — Patient Instructions (Addendum)
Your physician wants you to follow-up in:  12 months.  You will receive a reminder letter in the mail two months in advance. If you don't receive a letter, please call our office to schedule the follow-up appointment.   

## 2012-12-26 ENCOUNTER — Telehealth: Payer: Self-pay | Admitting: Gastroenterology

## 2012-12-26 NOTE — Telephone Encounter (Signed)
  Colonoscopy [place], [place]; Dr. Leodis Liverpool; done for screening; three small polyps were removed, all were TA on pathology, his MD recommended recall colonsocopy in 5 years.  Patty, Can you get in touch with him.  He needs colonoscopy for polyp surveillance, (LEC, moderate sedation).  tthank

## 2012-12-29 NOTE — Telephone Encounter (Signed)
Pt has been scheduled for colon and previsit he is aware

## 2013-01-06 ENCOUNTER — Encounter: Payer: Self-pay | Admitting: Cardiovascular Disease

## 2013-02-04 ENCOUNTER — Ambulatory Visit (AMBULATORY_SURGERY_CENTER): Payer: Self-pay | Admitting: *Deleted

## 2013-02-04 VITALS — Ht 73.0 in | Wt 265.2 lb

## 2013-02-04 DIAGNOSIS — Z86010 Personal history of colonic polyps: Secondary | ICD-10-CM

## 2013-02-04 MED ORDER — MOVIPREP 100 G PO SOLR: 1.0000 | Freq: Once | ORAL | Status: DC

## 2013-02-04 NOTE — Progress Notes (Signed)
No egg or soy allergy. No anesthesia problems.  

## 2013-02-05 ENCOUNTER — Encounter: Payer: Self-pay | Admitting: Gastroenterology

## 2013-02-10 ENCOUNTER — Ambulatory Visit (AMBULATORY_SURGERY_CENTER): Payer: Medicare Other | Admitting: Gastroenterology

## 2013-02-10 ENCOUNTER — Encounter: Payer: Self-pay | Admitting: Gastroenterology

## 2013-02-10 VITALS — BP 113/78 | HR 64 | Temp 97.8°F | Resp 11 | Ht 73.0 in | Wt 265.0 lb

## 2013-02-10 DIAGNOSIS — D126 Benign neoplasm of colon, unspecified: Secondary | ICD-10-CM

## 2013-02-10 DIAGNOSIS — Z86010 Personal history of colonic polyps: Secondary | ICD-10-CM

## 2013-02-10 MED ORDER — SODIUM CHLORIDE 0.9 % IV SOLN: 500.0000 mL | INTRAVENOUS | Status: DC

## 2013-02-10 NOTE — Patient Instructions (Addendum)
YOU HAD AN ENDOSCOPIC PROCEDURE TODAY AT THE [facility]: Refer to the procedure report that was given to you for any specific questions about what was found during the examination.  If the procedure report does not answer your questions, please call your gastroenterologist to clarify.  If you requested that your care partner not be given the details of your procedure findings, then the procedure report has been included in a sealed envelope for you to review at your convenience later.  YOU SHOULD EXPECT: Some feelings of bloating in the abdomen. Passage of more gas than usual.  Walking can help get rid of the air that was put into your GI tract during the procedure and reduce the bloating. If you had a lower endoscopy (such as a colonoscopy or flexible sigmoidoscopy) you may notice spotting of blood in your stool or on the toilet paper. If you underwent a bowel prep for your procedure, then you may not have a normal bowel movement for a few days.  DIET: Your first meal following the procedure should be a light meal and then it is ok to progress to your normal diet.  A half-sandwich or bowl of soup is an example of a good first meal.  Heavy or fried foods are harder to digest and may make you feel nauseous or bloated.  Likewise meals heavy in dairy and vegetables can cause extra gas to form and this can also increase the bloating.  Drink plenty of fluids but you should avoid alcoholic beverages for 24 hours.  ACTIVITY: Your care partner should take you home directly after the procedure.  You should plan to take it easy, moving slowly for the rest of the day.  You can resume normal activity the day after the procedure however you should NOT DRIVE or use heavy machinery for 24 hours (because of the sedation medicines used during the test).    SYMPTOMS TO REPORT IMMEDIATELY: A gastroenterologist can be reached at any hour.  During normal business hours, 8:30 AM to 5:00 PM Monday through Friday,  call [phone].  After hours and on weekends, please call the GI answering service at [phone] who will take a message and have the physician on call contact you.   Following lower endoscopy (colonoscopy or flexible sigmoidoscopy):  Excessive amounts of blood in the stool  Significant tenderness or worsening of abdominal pains  Swelling of the abdomen that is new, acute  Fever of 100F or higher    FOLLOW UP: If any biopsies were taken you will be contacted by phone or by letter within the next 1-3 weeks.  Call your gastroenterologist if you have not heard about the biopsies in 3 weeks.  Our staff will call the home number listed on your records the next business day following your procedure to check on you and address any questions or concerns that you may have at that time regarding the information given to you following your procedure. This is a courtesy call and so if there is no answer at the home number and we have not heard from you through the emergency physician on call, we will assume that you have returned to your regular daily activities without incident.  SIGNATURES/CONFIDENTIALITY: You and/or your care partner have signed paperwork which will be entered into your electronic medical record.  These signatures attest to the fact that that the information above on your After Visit Summary has been reviewed and is understood.  Full responsibility of the confidentiality   of this discharge information lies with you and/or your care-partner.     

## 2013-02-10 NOTE — Op Note (Signed)
[facility] [address] [place], [address]   COLONOSCOPY PROCEDURE REPORT  PATIENT: Peter Brooks, Peter Brooks  MR#: [id number] BIRTHDATE: [birth year] , 66  yrs. old GENDER: Male ENDOSCOPIST: Rachael Fee, MD PROCEDURE DATE:  02/10/2013 PROCEDURE:   Colonoscopy with snare polypectomy First Screening Colonoscopy - Avg.  risk and is 50 yrs.  old or older - No.  Prior Negative Screening - Now for repeat screening. N/A  History of Adenoma - Now for follow-up colonoscopy & has been > or = to 3 yrs.  Yes hx of adenoma.  Has been 3 or more years since last colonoscopy.  Polyps Removed Today? Yes. ASA CLASS:   Class II INDICATIONS:3 small TAs removed [place] 2005. MEDICATIONS: Fentanyl 50 mcg IV, Versed 6 mg IV, and These medications were titrated to patient response per physician's verbal order  DESCRIPTION OF PROCEDURE:   After the risks benefits and alternatives of the procedure were thoroughly explained, informed consent was obtained.  A digital rectal exam revealed no abnormalities of the rectum.   The LB [id number]  endoscope was introduced through the anus and advanced to the cecum, which was identified by both the appendix and ileocecal valve. No adverse events experienced.   The quality of the prep was good.  The instrument was then slowly withdrawn as the colon was fully examined.   COLON FINDINGS: Two sessile polyps were found, removed; one of them was retrieved and sent to pathology.  One was located in transverse, 11mm across, removed with cold snare, retrieved.  The other was 2mm, desceding segment, removed wtih cold snare.  The examination was otherwise normal.  Retroflexed views revealed no abnormalities. The time to cecum=2 minutes 05 seconds.  Withdrawal time=12 minutes 02 seconds.  The scope was withdrawn and the procedure completed. COMPLICATIONS: There were no complications.  ENDOSCOPIC IMPRESSION: Two sessile polyps were found, removed; one of  them was retrieved and sent to pathology. The examination was otherwise normal.  RECOMMENDATIONS: If the polyp(s) removed today are proven to be adenomatous (pre-cancerous) polyps, you will need a colonoscopy in 3 years. Otherwise you should continue to follow colorectal cancer screening guidelines for "routine risk" patients with a colonoscopy in 10 years.  You will receive a letter within 1-2 weeks with the results of your biopsy as well as final recommendations.  Please call my office if you have not received a letter after 3 weeks.   eSigned:  Rachael Fee, MD 02/10/2013 1:47 PM

## 2013-02-10 NOTE — Progress Notes (Signed)
Patient did not experience any of the following events: a burn prior to discharge; a fall within the facility; wrong site/side/patient/procedure/implant event; or a hospital transfer or hospital admission upon discharge from the facility. (G8907) Patient did not have preoperative order for IV antibiotic SSI prophylaxis. (G8918)  

## 2013-02-11 ENCOUNTER — Telehealth: Payer: Self-pay | Admitting: *Deleted

## 2013-02-11 NOTE — Telephone Encounter (Signed)
  Follow up Call-  Call back number 02/10/2013  Post procedure Call Back phone  # [phone]  Permission to leave phone message Yes     Patient questions:  Do you have a fever, pain , or abdominal swelling? no Pain Score  0 *  Have you tolerated food without any problems? yes  Have you been able to return to your normal activities? yes  Do you have any questions about your discharge instructions: Diet   no Medications  no Follow up visit  no  Do you have questions or concerns about your Care? no  Actions: * If pain score is 4 or above: No action needed, pain <4.

## 2013-02-19 ENCOUNTER — Encounter: Payer: Self-pay | Admitting: Gastroenterology

## 2013-06-12 ENCOUNTER — Encounter: Payer: Self-pay | Admitting: Cardiovascular Disease

## 2013-06-12 ENCOUNTER — Ambulatory Visit (INDEPENDENT_AMBULATORY_CARE_PROVIDER_SITE_OTHER): Payer: Medicare HMO | Admitting: Cardiovascular Disease

## 2013-06-12 VITALS — BP 130/88 | HR 72 | Ht 74.0 in | Wt 264.0 lb

## 2013-06-12 DIAGNOSIS — R5383 Other fatigue: Secondary | ICD-10-CM

## 2013-06-12 DIAGNOSIS — R5381 Other malaise: Secondary | ICD-10-CM

## 2013-06-12 DIAGNOSIS — I2510 Atherosclerotic heart disease of native coronary artery without angina pectoris: Secondary | ICD-10-CM

## 2013-06-12 DIAGNOSIS — R011 Cardiac murmur, unspecified: Secondary | ICD-10-CM

## 2013-06-12 DIAGNOSIS — R002 Palpitations: Secondary | ICD-10-CM

## 2013-06-12 DIAGNOSIS — E785 Hyperlipidemia, unspecified: Secondary | ICD-10-CM

## 2013-06-12 DIAGNOSIS — R42 Dizziness and giddiness: Secondary | ICD-10-CM

## 2013-06-12 NOTE — Patient Instructions (Signed)
Your physician recommends that you schedule a follow-up appointment in:  6 weeks.   Your physician has requested that you have an echocardiogram. Echocardiography is a painless test that uses sound waves to create images of your heart. It provides your doctor with information about the size and shape of your heart and how well your heart's chambers and valves are working. This procedure takes approximately one hour. There are no restrictions for this procedure.   Your physician has requested that you have an exercise stress myoview. For further information please visit [email or web address]. Please follow instruction sheet, as given.  Your physician has recommended that you wear an event monitor. Event monitors are medical devices that record the heart's electrical activity. Doctors most often [place] these monitors to diagnose arrhythmias. Arrhythmias are problems with the speed or rhythm of the heartbeat. The monitor is a small, portable device. You can wear one while you do your normal daily activities. This is usually used to diagnose what is causing palpitations/syncope (passing out).

## 2013-06-12 NOTE — Progress Notes (Signed)
History of Present Illness: 68 y.o. WM with history of CAD, HTN, HLD, GERD here today for cardiac follow up.  LHC 10/2008: pLAD 40%, mLAD 30%, irregs in CFX and RCA, EF 60%, normal R heart pressures. Echo 11/2008: Ef 60-65%, grade 1 diast dysfxn, mild LAE, PASp 29, neg bubble study. Eval by Dr Olevia Perches in 2010 and event monitor demonstrated NSR. He was admitted 3/20-3/21/13 with dizziness. Head CT at [facility] was neg. He was transferred to the cardiology service at [facility] where he ruled out for MI. He had no arrhythmias on telemetry. Echo June 2013 with normal LV size and function, no valve issues.   He is here today for cardiac follow up. He notes heart pounding on Christmas Eve. His heart rate was elevated when checked by his wife who is a Marine scientist. This lasted for 15-20 minutes. He has had 2 recurrent episodes over the last 3 weeks. He has had some dizziness. No near syncope or syncope. No chest pain but overall feeling fatigued. This is a clear change since his last examination in 2014.    Primary Care Physician: Carlus Pavlov  Last Lipid Profile: Followed in primary care.   Past Medical History  Diagnosis Date  . Hypothyroidism   . Hyperlipidemia   . GERD (gastroesophageal reflux disease)   . Lumbar herniated disc   . CAD (coronary artery disease)     nonobstructive CAD with 40% narrowing in the proximal LAD   . Chest pain   . Dizziness   . Hypertension   . Anxiety   . Arthritis     Past Surgical History  Procedure Laterality Date  . Cardiac catheterization  11/11/2008  . Cataract extraction Right   . Knee arthroscopy Bilateral 2010 Right    Current Outpatient Prescriptions  Medication Sig Dispense Refill  . aspirin EC 81 MG tablet Take 81 mg by mouth daily.      . Calcium Carbonate-Vitamin D (CALTRATE 600+D) 600-400 MG-UNIT per tablet Take 1 tablet by mouth daily.      . diazepam (VALIUM) 2 MG tablet Take 2 mg by mouth every 6 (six) hours as needed for anxiety.      [name] Kitchen  levothyroxine (SYNTHROID, LEVOTHROID) 50 MCG tablet Take 50 mcg by mouth daily.      . Multiple Vitamin (MULITIVITAMIN WITH MINERALS) TABS Take 1 tablet by mouth daily.      [name] Kitchen NIACIN PO Take by mouth daily. Flash Free      . simvastatin (ZOCOR) 20 MG tablet Take 20 mg by mouth every evening.       No current facility-administered medications for this visit.    No Known Allergies  History   Social History  . Marital Status: Married    Spouse Name: N/A    Number of Children: N/A  . Years of Education: N/A   Occupational History  . Not on file.   Social History Main Topics  . Smoking status: Never Smoker   . Smokeless tobacco: Never Used  . Alcohol Use: No  . Drug Use: No  . Sexual Activity: Yes     Comment: married   Other Topics Concern  . Not on file   Social History Narrative  . No narrative on file    Family History  Problem Relation Age of Onset  . Coronary artery disease Father     with CABG valve replacement in his 69's  . Heart attack Mother     in her 5's  .  Colon cancer Paternal Uncle 88  . Esophageal cancer Neg Hx   . Rectal cancer Neg Hx   . Stomach cancer Neg Hx     Review of Systems:  As stated in the HPI and otherwise negative.   BP 130/88  Pulse 72  Ht 6\' 2"  (1.88 m)  Wt 264 lb (119.75 kg)  BMI 33.88 kg/m2  SpO2 97%  Physical Examination: General: Well developed, well nourished, NAD HEENT: OP clear, mucus membranes moist SKIN: warm, dry. No rashes. Neuro: No focal deficits Musculoskeletal: Muscle strength 5/5 all ext Psychiatric: Mood and affect normal Neck: No JVD, no carotid bruits, no thyromegaly, no lymphadenopathy. Lungs:Clear bilaterally, no wheezes, rhonci, crackles Cardiovascular: Regular rate and rhythm. No murmurs, gallops or rubs. Abdomen:Soft. Bowel sounds present. Non-tender.  Extremities: No lower extremity edema. Pulses are 2 + in the bilateral DP/PT  Echo June 2013: Left ventricle: The cavity size was normal. Wall  thickness was increased in a pattern of mild LVH. There was mild focal basal hypertrophy of the septum. Systolic function was normal. The estimated ejection fraction was in the range of 55% to 65%. Wall motion was normal; there were no regional wall motion abnormalities. Doppler parameters are consistent with abnormal left ventricular relaxation (grade 1 diastolic dysfunction). - Left atrium: The atrium was mildly dilated.  Assessment and Plan:   1. CAD:  Known to have mild to moderate LAD disease. He is having fatigue and dizziness. It has been over four years since last ischemic evaluation. Will arrange exercise stress myoview to exclude ischemia.   2. HTN: BP well controlled. No changes.   3. HLD: Continue statin. Lipids followed in primary care.   4. Cardiac murmur: Will arrange echo to assess. He is not known to have valvular disease by last echo in 2013. Recent dizziness and fatigue.   5. Fatigue/Dizziness: Will check echo to assess LVEF, exclude valvular disease. Stress myoview to exclude ischemia. Will check with primary care to see if TSH has been checked.   6. Palpitations: Will arrange event monitor.

## 2013-06-29 ENCOUNTER — Encounter (HOSPITAL_COMMUNITY): Payer: Medicare HMO

## 2013-07-02 ENCOUNTER — Other Ambulatory Visit (HOSPITAL_COMMUNITY): Payer: Medicare HMO

## 2013-07-16 ENCOUNTER — Encounter (HOSPITAL_COMMUNITY): Payer: Medicare HMO

## 2013-07-16 ENCOUNTER — Other Ambulatory Visit (HOSPITAL_COMMUNITY): Payer: Medicare HMO

## 2013-07-24 ENCOUNTER — Ambulatory Visit: Payer: Medicare HMO | Admitting: Cardiovascular Disease

## 2013-07-27 ENCOUNTER — Ambulatory Visit (HOSPITAL_COMMUNITY): Payer: Medicare HMO | Attending: Cardiology | Admitting: Radiology

## 2013-07-27 ENCOUNTER — Ambulatory Visit (HOSPITAL_BASED_OUTPATIENT_CLINIC_OR_DEPARTMENT_OTHER): Payer: Medicare HMO | Admitting: Radiology

## 2013-07-27 VITALS — BP 147/87 | HR 60 | Ht 74.0 in | Wt 265.0 lb

## 2013-07-27 DIAGNOSIS — R011 Cardiac murmur, unspecified: Secondary | ICD-10-CM | POA: Insufficient documentation

## 2013-07-27 DIAGNOSIS — R5381 Other malaise: Secondary | ICD-10-CM | POA: Insufficient documentation

## 2013-07-27 DIAGNOSIS — R5383 Other fatigue: Secondary | ICD-10-CM

## 2013-07-27 DIAGNOSIS — I2510 Atherosclerotic heart disease of native coronary artery without angina pectoris: Secondary | ICD-10-CM

## 2013-07-27 DIAGNOSIS — R002 Palpitations: Secondary | ICD-10-CM | POA: Insufficient documentation

## 2013-07-27 DIAGNOSIS — R42 Dizziness and giddiness: Secondary | ICD-10-CM | POA: Insufficient documentation

## 2013-07-27 MED ORDER — TECHNETIUM TC 99M SESTAMIBI GENERIC - CARDIOLITE
11.0000 | Freq: Once | INTRAVENOUS | Status: AC | PRN
Start: 2013-07-27 — End: 2013-07-27
  Administered 2013-07-27: 11 via INTRAVENOUS

## 2013-07-27 MED ORDER — TECHNETIUM TC 99M SESTAMIBI GENERIC - CARDIOLITE
33.0000 | Freq: Once | INTRAVENOUS | Status: AC | PRN
  Administered 2013-07-27: 33 via INTRAVENOUS

## 2013-07-27 NOTE — Progress Notes (Signed)
Echocardiogram performed.  

## 2013-07-27 NOTE — Progress Notes (Signed)
[facility] [place], [place] [address] [phone]    Cardiology Nuclear Med Study  QUANELL LOUGHNEY is a 68 y.o. male     MRN : [id number]     DOB: [birth year]  Procedure Date: 07/27/2013  Nuclear Med Background Indication for Stress Test:  Evaluation for Ischemia History:  CAD (n/o), Cath, Echo 2013 EF 55-60%, MPI 1997 Cardiac Risk Factors: Strong Family History - CAD, Hypertension and Lipids  Symptoms:  Dizziness, Fatigue and Palpitations   Nuclear Pre-Procedure Caffeine/Decaff Intake:  None > 12 hrs NPO After: 8:00pm   Lungs:  clear O2 Sat: 97% on room air. IV 0.9% NS with Angio Cath:  22g  IV Site: R Antecubital x 1, tolerated well IV Started by:  Irven Baltimore, RN  Chest Size (in):  50 Cup Size: n/a  Height: 6\' 2"  (1.88 m)  Weight:  265 lb (120.203 kg)  BMI:  Body mass index is 34.01 kg/(m^2). Tech Comments:  N/A    Nuclear Med Study 1 or 2 day study: 1 day  Stress Test Type:  Stress  Reading MD: N/A  Order Authorizing Provider: Dr.Christopher Angelena Form, MD  Resting Radionuclide: Technetium 67m Sestamibi  Resting Radionuclide Dose: 10.9 mCi   Stress Radionuclide:  Technetium 83m Sestamibi  Stress Radionuclide Dose: 33.0 mCi           Stress Protocol Rest HR: 60 Stress HR: 137  Rest BP: 147/87 Stress BP: 154/78  Exercise Time (min): 7:30 METS: 9.3           Dose of Adenosine (mg):  n/a Dose of Lexiscan: n/a mg  Dose of Atropine (mg): n/a Dose of Dobutamine: n/a mcg/kg/min (at max HR)  Stress Test Technologist: Glade Lloyd, BS-ES  Nuclear Technologist:  Charlton Amor, CNMT     Rest Procedure:  Myocardial perfusion imaging was performed at rest 45 minutes following the intravenous administration of Technetium 31m Sestamibi. Rest ECG: NSR - Normal EKG  Stress Procedure:  The patient exercised on the treadmill utilizing the Bruce Protocol for 7:30 minutes. The patient stopped due to fatigue and denied any chest  pain.  Technetium 5m Sestamibi was injected at peak exercise and myocardial perfusion imaging was performed after a brief delay. Stress ECG: No significant change from baseline ECG  QPS Raw Data Images:  SOft tissue (diaphragm, subcutaneous fat) surround heart.   Stress Images:Thinning in the inferior, inferolateral base.  Otherwise normal perfusion.   Rest Images:  Comparison with the stress images reveals no significant change. Subtraction (SDS):  No evidence of ischemia. Transient Ischemic Dilatation (Normal <1.22):  1.00 Lung/Heart Ratio (Normal <0.45):  0.33  Quantitative Gated Spect Images QGS EDV:  119 ml QGS ESV:  49 ml  Impression Exercise Capacity:  Good exercise capacity. BP Response:  Normal blood pressure response. Clinical Symptoms:  No chest pain. ECG Impression:  No significant ST segment change suggestive of ischemia. Comparison with Prior Nuclear Study:Prior study done in [place]  No report to compare.    Overall Impression: Probable normal perfusion and minimal soft tissue attenuation.  No evidence for significant ischemia or scar.    LV Ejection Fraction: 59%.  LV Wall Motion:  NL LV Function; NL Wall Motion  Dorris Carnes

## 2013-07-28 ENCOUNTER — Telehealth: Payer: Self-pay | Admitting: *Deleted

## 2013-07-28 NOTE — Telephone Encounter (Signed)
Spoke with pt and reviewed stress test and echo results with him. He reports he has not worn monitor as appt needed to be rescheduled due to weather. He states palpitations have improved some and he does not want to wear monitor until he discusses at follow up appt with Dr. Angelena Form.  I made him aware if he changes his mind and would like to wear monitor prior to follow up appointment he could contact our office and we would schedule.

## 2013-08-04 ENCOUNTER — Encounter: Payer: Self-pay | Admitting: *Deleted

## 2013-08-04 ENCOUNTER — Encounter (INDEPENDENT_AMBULATORY_CARE_PROVIDER_SITE_OTHER): Payer: Medicare HMO

## 2013-08-04 DIAGNOSIS — R42 Dizziness and giddiness: Secondary | ICD-10-CM

## 2013-08-04 DIAGNOSIS — R002 Palpitations: Secondary | ICD-10-CM

## 2013-08-04 DIAGNOSIS — I2510 Atherosclerotic heart disease of native coronary artery without angina pectoris: Secondary | ICD-10-CM

## 2013-08-04 NOTE — Progress Notes (Signed)
Patient ID: Peter Brooks, male   DOB: Jan 18, 1946, 68 y.o.   MRN: [id number] E-Cardio verite 30 day cardiac event monitor applied to patient.

## 2013-08-18 ENCOUNTER — Encounter: Payer: Self-pay | Admitting: Cardiovascular Disease

## 2013-08-18 ENCOUNTER — Ambulatory Visit (INDEPENDENT_AMBULATORY_CARE_PROVIDER_SITE_OTHER): Payer: Medicare HMO | Admitting: Cardiovascular Disease

## 2013-08-18 VITALS — BP 156/85 | HR 68 | Ht 74.0 in | Wt 269.0 lb

## 2013-08-18 DIAGNOSIS — E785 Hyperlipidemia, unspecified: Secondary | ICD-10-CM

## 2013-08-18 DIAGNOSIS — R5381 Other malaise: Secondary | ICD-10-CM

## 2013-08-18 DIAGNOSIS — I2510 Atherosclerotic heart disease of native coronary artery without angina pectoris: Secondary | ICD-10-CM

## 2013-08-18 DIAGNOSIS — R5383 Other fatigue: Secondary | ICD-10-CM

## 2013-08-18 DIAGNOSIS — R002 Palpitations: Secondary | ICD-10-CM

## 2013-08-18 NOTE — Progress Notes (Signed)
History of Present Illness: 68 y.o. WM with history of CAD, HTN, HLD, GERD here today for cardiac follow up.  LHC 10/2008: pLAD 40%, mLAD 30%, irregs in CFX and RCA, EF 60%, normal R heart pressures. Echo 11/2008: Ef 60-65%, grade 1 diast dysfxn, mild LAE, PASp 29, neg bubble study. Eval by Dr Olevia Perches in 2010 and event monitor demonstrated NSR. He was admitted 3/20-3/21/13 with dizziness. Head CT at [facility] was neg. He was transferred to the cardiology service at [facility] where he ruled out for MI. He had no arrhythmias on telemetry. Echo June 2013 with normal LV size and function, no valve issues. He was seen in January 2015 and had c/o heart racing, dizziness, fatigue. Stress myoview 07/27/13 without ischemia. Echo 07/27/13 with normal LV function, no significant valve issues. Event monitor without any arrythmias.   He is here today for cardiac follow up. No chest pain or SOB. He is feeling much better.   Primary Care Physician: Carlus Pavlov  Last Lipid Profile: Followed in primary care.   Past Medical History  Diagnosis Date  . Hypothyroidism   . Hyperlipidemia   . GERD (gastroesophageal reflux disease)   . Lumbar herniated disc   . CAD (coronary artery disease)     nonobstructive CAD with 40% narrowing in the proximal LAD   . Chest pain   . Dizziness   . Hypertension   . Anxiety   . Arthritis     Past Surgical History  Procedure Laterality Date  . Cardiac catheterization  11/11/2008  . Cataract extraction Right   . Knee arthroscopy Bilateral 2010 Right    Current Outpatient Prescriptions  Medication Sig Dispense Refill  . aspirin EC 81 MG tablet Take 81 mg by mouth daily.      . Calcium Carbonate-Vitamin D (CALTRATE 600+D) 600-400 MG-UNIT per tablet Take 1 tablet by mouth daily.      . diazepam (VALIUM) 2 MG tablet Take 2 mg by mouth every 6 (six) hours as needed for anxiety.      [name] Kitchen levothyroxine (SYNTHROID, LEVOTHROID) 50 MCG tablet Take 50 mcg by mouth daily.      . Multiple  Vitamin (MULITIVITAMIN WITH MINERALS) TABS Take 1 tablet by mouth daily.      [name] Kitchen NIACIN PO Take by mouth daily. Flash Free      . simvastatin (ZOCOR) 20 MG tablet Take 20 mg by mouth every evening.       No current facility-administered medications for this visit.    No Known Allergies  History   Social History  . Marital Status: Married    Spouse Name: N/A    Number of Children: N/A  . Years of Education: N/A   Occupational History  . Not on file.   Social History Main Topics  . Smoking status: Never Smoker   . Smokeless tobacco: Never Used  . Alcohol Use: No  . Drug Use: No  . Sexual Activity: Yes     Comment: married   Other Topics Concern  . Not on file   Social History Narrative  . No narrative on file    Family History  Problem Relation Age of Onset  . Coronary artery disease Father     with CABG valve replacement in his 89's  . Heart attack Mother     in her 10's  . Colon cancer Paternal Uncle 55  . Esophageal cancer Neg Hx   . Rectal cancer Neg Hx   . Stomach cancer  Neg Hx     Review of Systems:  As stated in the HPI and otherwise negative.   BP 156/85  Pulse 68  Ht 6\' 2"  (1.88 m)  Wt 269 lb (122.018 kg)  BMI 34.52 kg/m2  Physical Examination: General: Well developed, well nourished, NAD HEENT: OP clear, mucus membranes moist SKIN: warm, dry. No rashes. Neuro: No focal deficits Musculoskeletal: Muscle strength 5/5 all ext Psychiatric: Mood and affect normal Neck: No JVD, no carotid bruits, no thyromegaly, no lymphadenopathy. Lungs:Clear bilaterally, no wheezes, rhonci, crackles Cardiovascular: Regular rate and rhythm. No murmurs, gallops or rubs. Abdomen:Soft. Bowel sounds present. Non-tender.  Extremities: No lower extremity edema. Pulses are 2 + in the bilateral DP/PT  Echo 07/27/13: Left ventricle: The cavity size was normal. Wall thickness was increased in a pattern of mild LVH. Systolic function was normal. The estimated ejection  fraction was in the range of 60% to 65%. Wall motion was normal; there were no regional wall motion abnormalities. Doppler parameters are consistent with abnormal left ventricular relaxation (grade 1 diastolic dysfunction). - Aortic valve: Trileaflet; moderately calcified leaflets. - Mitral valve: Mildly calcified annulus. Mildly calcified leaflets . - Left atrium: The atrium was mildly dilated. - Right ventricle: The cavity size was normal. Systolic function was normal. - Tricuspid valve: Peak RV-RA gradient: 32mm Hg (S). - Pulmonary arteries: PA peak pressure: 35mm Hg (S). - Inferior vena cava: The vessel was normal in size; the respirophasic diameter changes were in the normal range (= 50%); findings are consistent with normal central venous pressure. Impressions:  - Normal LV size with mild LV hypertrophy. EF 60-65%. Normal RV size and systolic function. Aortic sclerosis without stenosis.  Stress myoview 07/27/13: Stress Procedure: The patient exercised on the treadmill utilizing the Bruce Protocol for 7:30 minutes. The patient stopped due to fatigue and denied any chest pain. Technetium 29m Sestamibi was injected at peak exercise and myocardial perfusion imaging was performed after a brief delay.  Stress ECG: No significant change from baseline ECG  QPS  Raw Data Images: SOft tissue (diaphragm, subcutaneous fat) surround heart.  Stress Images:Thinning in the inferior, inferolateral base. Otherwise normal perfusion.  Rest Images: Comparison with the stress images reveals no significant change.  Subtraction (SDS): No evidence of ischemia.  Transient Ischemic Dilatation (Normal <1.22): 1.00  Lung/Heart Ratio (Normal <0.45): 0.33  Quantitative Gated Spect Images  QGS EDV: 119 ml  QGS ESV: 49 ml  Impression  Exercise Capacity: Good exercise capacity.  BP Response: Normal blood pressure response.  Clinical Symptoms: No chest pain.  ECG Impression: No significant ST segment  change suggestive of ischemia.  Comparison with Prior Nuclear Study:Prior study done in [place] No report to compare.  Overall Impression: Probable normal perfusion and minimal soft tissue attenuation. No evidence for significant ischemia or scar.  LV Ejection Fraction: 59%. LV Wall Motion: NL LV Function; NL Wall Motion  Assessment and Plan:   1. CAD:  Stress myoview March 2015 without ischemia. Continue medical management.   2. HTN: BP well controlled at home. No changes.   3. HLD: Continue statin. Lipids followed in primary care.   4. Fatigue/Dizziness: Resolved.   6. Palpitations: No arrythmias on event monitor.

## 2013-08-18 NOTE — Patient Instructions (Signed)
Your physician wants you to follow-up in:  12 months.  You will receive a reminder letter in the mail two months in advance. If you don't receive a letter, please call our office to schedule the follow-up appointment.   

## 2014-08-10 ENCOUNTER — Ambulatory Visit (INDEPENDENT_AMBULATORY_CARE_PROVIDER_SITE_OTHER): Payer: Medicare HMO | Admitting: Cardiovascular Disease

## 2014-08-10 ENCOUNTER — Ambulatory Visit: Payer: Medicare HMO | Admitting: Cardiovascular Disease

## 2014-08-10 ENCOUNTER — Encounter: Payer: Self-pay | Admitting: Cardiovascular Disease

## 2014-08-10 VITALS — BP 138/80 | HR 71 | Ht 73.0 in | Wt 273.4 lb

## 2014-08-10 DIAGNOSIS — R002 Palpitations: Secondary | ICD-10-CM

## 2014-08-10 DIAGNOSIS — E785 Hyperlipidemia, unspecified: Secondary | ICD-10-CM

## 2014-08-10 DIAGNOSIS — I2510 Atherosclerotic heart disease of native coronary artery without angina pectoris: Secondary | ICD-10-CM

## 2014-08-10 DIAGNOSIS — I10 Essential (primary) hypertension: Secondary | ICD-10-CM

## 2014-08-10 NOTE — Progress Notes (Signed)
CC: Follow up CAD. No complaints.   History of Present Illness: 69 y.o. WM with history of CAD, HTN, HLD, GERD here today for cardiac follow up.  Last cardiac cath June 2010 with proximal LAD 40%, mid LAD 30%, irregularities in Circumflex and RCA, EF 60%, normal R heart pressures. Event monitor in 2010 demonstrated NSR. He was admitted 3/20-3/21/13 with dizziness. Head CT at [facility] was negative. He was transferred to the cardiology service at [facility] where he ruled out for MI. He had no arrhythmias on telemetry. He was seen in January 2015 and had c/o heart racing, dizziness, fatigue. Stress myoview 07/27/13 without ischemia. Echo 07/27/13 with normal LV function, no significant valve issues. Event monitor without any arrythmias.   He is here today for cardiac follow up. No chest pain or SOB. He is feeling much better.   Primary Care Physician: Carlus Pavlov  Last Lipid Profile: Followed in primary care.   Past Medical History  Diagnosis Date  . Hypothyroidism   . Hyperlipidemia   . GERD (gastroesophageal reflux disease)   . Lumbar herniated disc   . CAD (coronary artery disease)     nonobstructive CAD with 40% narrowing in the proximal LAD   . Chest pain   . Dizziness   . Hypertension   . Anxiety   . Arthritis     Past Surgical History  Procedure Laterality Date  . Cardiac catheterization  11/11/2008  . Cataract extraction Right   . Knee arthroscopy Bilateral 2010 Right    Current Outpatient Prescriptions  Medication Sig Dispense Refill  . Ascorbic Acid (VITAMIN C) 1000 MG tablet Take 1,000 mg by mouth daily.    [name] Kitchen aspirin EC 81 MG tablet Take 81 mg by mouth daily.    . Calcium Carbonate-Vitamin D (CALTRATE 600+D) 600-400 MG-UNIT per tablet Take 1 tablet by mouth daily.    . Coenzyme Q10 (COQ-10) 400 MG CAPS Take 400 mg by mouth daily.    . diazepam (VALIUM) 2 MG tablet Take 2 mg by mouth every 6 (six) hours as needed for anxiety.    [name] Kitchen levothyroxine (SYNTHROID, LEVOTHROID) 50 MCG  tablet Take 50 mcg by mouth daily.    . Multiple Vitamin (MULITIVITAMIN WITH MINERALS) TABS Take 1 tablet by mouth daily.    . Multiple Vitamins-Minerals (PRESERVISION AREDS 2) CAPS Take 2 capsules by mouth daily.    [name] Kitchen NIACIN PO Take by mouth daily. Flash Free    . Omega-3 Fatty Acids (FISH OIL) 1200 MG CAPS Take 1,200 mg by mouth daily.    . Probiotic Product (PROBIOTIC DAILY PO) Take 1 tablet by mouth daily.    . simvastatin (ZOCOR) 20 MG tablet Take 20 mg by mouth every evening.     No current facility-administered medications for this visit.    No Known Allergies  History   Social History  . Marital Status: Married    Spouse Name: N/A  . Number of Children: N/A  . Years of Education: N/A   Occupational History  . Not on file.   Social History Main Topics  . Smoking status: Never Smoker   . Smokeless tobacco: Never Used  . Alcohol Use: No  . Drug Use: No  . Sexual Activity: Yes     Comment: married   Other Topics Concern  . Not on file   Social History Narrative    Family History  Problem Relation Age of Onset  . Coronary artery disease Father     with CABG  valve replacement in his 50's  . Heart attack Mother     in her 31's  . Colon cancer Paternal Uncle 32  . Esophageal cancer Neg Hx   . Rectal cancer Neg Hx   . Stomach cancer Neg Hx     Review of Systems:  As stated in the HPI and otherwise negative.   BP 138/80 mmHg  Pulse 71  Ht 6\' 1"  (1.854 m)  Wt 273 lb 6.4 oz (124.013 kg)  BMI 36.08 kg/m2  Physical Examination: General: Well developed, well nourished, NAD HEENT: OP clear, mucus membranes moist SKIN: warm, dry. No rashes. Neuro: No focal deficits Musculoskeletal: Muscle strength 5/5 all ext Psychiatric: Mood and affect normal Neck: No JVD, no carotid bruits, no thyromegaly, no lymphadenopathy. Lungs:Clear bilaterally, no wheezes, rhonci, crackles Cardiovascular: Regular rate and rhythm. No murmurs, gallops or rubs. Abdomen:Soft. Bowel  sounds present. Non-tender.  Extremities: No lower extremity edema. Pulses are 2 + in the bilateral DP/PT  Echo 07/27/13: Left ventricle: The cavity size was normal. Wall thickness was increased in a pattern of mild LVH. Systolic function was normal. The estimated ejection fraction was in the range of 60% to 65%. Wall motion was normal; there were no regional wall motion abnormalities. Doppler parameters are consistent with abnormal left ventricular relaxation (grade 1 diastolic dysfunction). - Aortic valve: Trileaflet; moderately calcified leaflets. - Mitral valve: Mildly calcified annulus. Mildly calcified leaflets . - Left atrium: The atrium was mildly dilated. - Right ventricle: The cavity size was normal. Systolic function was normal. - Tricuspid valve: Peak RV-RA gradient: 31mm Hg (S). - Pulmonary arteries: PA peak pressure: 11mm Hg (S). - Inferior vena cava: The vessel was normal in size; the respirophasic diameter changes were in the normal range (= 50%); findings are consistent with normal central venous pressure. Impressions:  - Normal LV size with mild LV hypertrophy. EF 60-65%. Normal RV size and systolic function. Aortic sclerosis without stenosis.  Stress myoview 07/27/13: Stress Procedure: The patient exercised on the treadmill utilizing the Bruce Protocol for 7:30 minutes. The patient stopped due to fatigue and denied any chest pain. Technetium 70m Sestamibi was injected at peak exercise and myocardial perfusion imaging was performed after a brief delay.  Stress ECG: No significant change from baseline ECG  QPS  Raw Data Images: SOft tissue (diaphragm, subcutaneous fat) surround heart.  Stress Images:Thinning in the inferior, inferolateral base. Otherwise normal perfusion.  Rest Images: Comparison with the stress images reveals no significant change.  Subtraction (SDS): No evidence of ischemia.  Transient Ischemic Dilatation (Normal <1.22): 1.00  Lung/Heart Ratio  (Normal <0.45): 0.33  Quantitative Gated Spect Images  QGS EDV: 119 ml  QGS ESV: 49 ml  Impression  Exercise Capacity: Good exercise capacity.  BP Response: Normal blood pressure response.  Clinical Symptoms: No chest pain.  ECG Impression: No significant ST segment change suggestive of ischemia.  Comparison with Prior Nuclear Study:Prior study done in [place] No report to compare.  Overall Impression: Probable normal perfusion and minimal soft tissue attenuation. No evidence for significant ischemia or scar.  LV Ejection Fraction: 59%. LV Wall Motion: NL LV Function; NL Wall Motion  EKG: NSR, rate 71 bpm. IVCD  Assessment and Plan:   1. CAD:  Stable. No chest pains. Stress myoview March 2015 without ischemia. Continue medical management with ASA and statin.   2. HTN: BP well controlled. No changes.   3. HLD: Continue statin. Lipids followed in primary care.   4. Palpitations: No arrythmias on  event monitor. Likely premature beats.

## 2014-08-10 NOTE — Patient Instructions (Signed)
Your physician wants you to follow-up in:  12 months.  You will receive a reminder letter in the mail two months in advance. If you don't receive a letter, please call our office to schedule the follow-up appointment.   

## 2014-09-08 ENCOUNTER — Ambulatory Visit: Payer: Medicare HMO | Admitting: Cardiovascular Disease

## 2015-07-11 ENCOUNTER — Ambulatory Visit (INDEPENDENT_AMBULATORY_CARE_PROVIDER_SITE_OTHER): Payer: PPO | Admitting: Cardiovascular Disease

## 2015-07-11 ENCOUNTER — Encounter: Payer: Self-pay | Admitting: Cardiovascular Disease

## 2015-07-11 VITALS — BP 142/90 | HR 70 | Ht 74.0 in | Wt 280.0 lb

## 2015-07-11 DIAGNOSIS — I2510 Atherosclerotic heart disease of native coronary artery without angina pectoris: Secondary | ICD-10-CM

## 2015-07-11 DIAGNOSIS — E785 Hyperlipidemia, unspecified: Secondary | ICD-10-CM | POA: Diagnosis not present

## 2015-07-11 DIAGNOSIS — I10 Essential (primary) hypertension: Secondary | ICD-10-CM

## 2015-07-11 DIAGNOSIS — R002 Palpitations: Secondary | ICD-10-CM

## 2015-07-11 DIAGNOSIS — F419 Anxiety disorder, unspecified: Secondary | ICD-10-CM

## 2015-07-11 MED ORDER — DIAZEPAM 2 MG PO TABS: 2.0000 mg | ORAL_TABLET | Freq: Two times a day (BID) | ORAL | Status: AC | PRN

## 2015-07-11 MED ORDER — AMLODIPINE BESYLATE 5 MG PO TABS: 5.0000 mg | ORAL_TABLET | Freq: Every day | ORAL | Status: DC

## 2015-07-11 NOTE — Progress Notes (Signed)
Chief Complaint  Patient presents with  . Follow-up  . Hypertension  . Hyperlipidemia     History of Present Illness: 70 yo WM with history of CAD, HTN, HLD, GERD here today for cardiac follow up.  Last cardiac cath June 2010 with proximal LAD 40%, mid LAD 30%, irregularities in Circumflex and RCA, EF 60%. He was admitted 3/20-3/21/13 with dizziness. Head CT at [facility] was negative. He was transferred to the cardiology service at [facility] where he ruled out for MI. He had no arrhythmias on telemetry. He was seen in January 2015 and had c/o heart racing, dizziness, fatigue. Stress myoview 07/27/13 without ischemia. Echo 07/27/13 with normal LV function, no significant valve issues. Event monitor without any arrythmias.   He is here today for cardiac follow up. No chest pain or SOB. He denies palpitations, near syncope or syncope.   Primary Care Physician: Carlus Pavlov  Last Lipid Profile: Followed in primary care.   Past Medical History  Diagnosis Date  . Hypothyroidism   . Hyperlipidemia   . GERD (gastroesophageal reflux disease)   . Lumbar herniated disc   . CAD (coronary artery disease)     nonobstructive CAD with 40% narrowing in the proximal LAD   . Chest pain   . Dizziness   . Hypertension   . Anxiety   . Arthritis     Past Surgical History  Procedure Laterality Date  . Cardiac catheterization  11/11/2008  . Cataract extraction Right   . Knee arthroscopy Bilateral 2010 Right    Current Outpatient Prescriptions  Medication Sig Dispense Refill  . Ascorbic Acid (VITAMIN C) 1000 MG tablet Take 1,000 mg by mouth daily.    [name] Kitchen aspirin EC 81 MG tablet Take 81 mg by mouth daily.    . Calcium Carbonate-Vitamin D (CALTRATE 600+D) 600-400 MG-UNIT per tablet Take 1 tablet by mouth daily.    . Coenzyme Q10 (COQ-10) 400 MG CAPS Take 400 mg by mouth daily.    . diazepam (VALIUM) 2 MG tablet Take 1 tablet (2 mg total) by mouth every 12 (twelve) hours as needed for anxiety. 30 tablet 1  .  levothyroxine (SYNTHROID, LEVOTHROID) 50 MCG tablet Take 50 mcg by mouth daily.    . Multiple Vitamin (MULITIVITAMIN WITH MINERALS) TABS Take 1 tablet by mouth daily.    . Multiple Vitamins-Minerals (PRESERVISION AREDS 2) CAPS Take 2 capsules by mouth daily.    [name] Kitchen NIACIN PO Take by mouth daily. Flash Free    . Omega-3 Fatty Acids (FISH OIL) 1200 MG CAPS Take 1,200 mg by mouth daily.    . Probiotic Product (PROBIOTIC DAILY PO) Take 1 tablet by mouth daily.    . simvastatin (ZOCOR) 20 MG tablet Take 20 mg by mouth every evening.    [name] Kitchen amLODipine (NORVASC) 5 MG tablet Take 1 tablet (5 mg total) by mouth daily. 30 tablet 11   No current facility-administered medications for this visit.    No Known Allergies  Social History   Social History  . Marital Status: Married    Spouse Name: N/A  . Number of Children: N/A  . Years of Education: N/A   Occupational History  . Not on file.   Social History Main Topics  . Smoking status: Never Smoker   . Smokeless tobacco: Never Used  . Alcohol Use: No  . Drug Use: No  . Sexual Activity: Yes     Comment: married   Other Topics Concern  . Not on file  Social History Narrative    Family History  Problem Relation Age of Onset  . Coronary artery disease Father     with CABG valve replacement in his 11's  . Heart attack Mother     in her 55's  . Colon cancer Paternal Uncle 39  . Esophageal cancer Neg Hx   . Rectal cancer Neg Hx   . Stomach cancer Neg Hx     Review of Systems:  As stated in the HPI and otherwise negative.   BP 142/90 mmHg  Pulse 70  Ht 6\' 2"  (1.88 m)  Wt 280 lb (127.007 kg)  BMI 35.93 kg/m2  SpO2 95%  Physical Examination: General: Well developed, well nourished, NAD HEENT: OP clear, mucus membranes moist SKIN: warm, dry. No rashes. Neuro: No focal deficits Musculoskeletal: Muscle strength 5/5 all ext Psychiatric: Mood and affect normal Neck: No JVD, no carotid bruits, no thyromegaly, no  lymphadenopathy. Lungs:Clear bilaterally, no wheezes, rhonci, crackles Cardiovascular: Regular rate and rhythm. No murmurs, gallops or rubs. Abdomen:Soft. Bowel sounds present. Non-tender.  Extremities: No lower extremity edema. Pulses are 2 + in the bilateral DP/PT  Echo 07/27/13: Left ventricle: The cavity size was normal. Wall thickness was increased in a pattern of mild LVH. Systolic function was normal. The estimated ejection fraction was in the range of 60% to 65%. Wall motion was normal; there were no regional wall motion abnormalities. Doppler parameters are consistent with abnormal left ventricular relaxation (grade 1 diastolic dysfunction). - Aortic valve: Trileaflet; moderately calcified leaflets. - Mitral valve: Mildly calcified annulus. Mildly calcified leaflets . - Left atrium: The atrium was mildly dilated. - Right ventricle: The cavity size was normal. Systolic function was normal. - Tricuspid valve: Peak RV-RA gradient: 47mm Hg (S). - Pulmonary arteries: PA peak pressure: 23mm Hg (S). - Inferior vena cava: The vessel was normal in size; the respirophasic diameter changes were in the normal range (= 50%); findings are consistent with normal central venous pressure. Impressions:  - Normal LV size with mild LV hypertrophy. EF 60-65%. Normal RV size and systolic function. Aortic sclerosis without stenosis.  Stress myoview 07/27/13: Stress Procedure: The patient exercised on the treadmill utilizing the Bruce Protocol for 7:30 minutes. The patient stopped due to fatigue and denied any chest pain. Technetium 32m Sestamibi was injected at peak exercise and myocardial perfusion imaging was performed after a brief delay.  Stress ECG: No significant change from baseline ECG  QPS  Raw Data Images: SOft tissue (diaphragm, subcutaneous fat) surround heart.  Stress Images:Thinning in the inferior, inferolateral base. Otherwise normal perfusion.  Rest Images: Comparison with the  stress images reveals no significant change.  Subtraction (SDS): No evidence of ischemia.  Transient Ischemic Dilatation (Normal <1.22): 1.00  Lung/Heart Ratio (Normal <0.45): 0.33  Quantitative Gated Spect Images  QGS EDV: 119 ml  QGS ESV: 49 ml  Impression  Exercise Capacity: Good exercise capacity.  BP Response: Normal blood pressure response.  Clinical Symptoms: No chest pain.  ECG Impression: No significant ST segment change suggestive of ischemia.  Comparison with Prior Nuclear Study:Prior study done in [place] No report to compare.  Overall Impression: Probable normal perfusion and minimal soft tissue attenuation. No evidence for significant ischemia or scar.  LV Ejection Fraction: 59%. LV Wall Motion: NL LV Function; NL Wall Motion  EKG:  EKG is ordered today. The ekg ordered today demonstrates NSR, rate 69  Recent Labs: No results found for requested labs within last 365 days.   Lipid Panel  Wt Readings from Last 3 Encounters:  07/11/15 280 lb (127.007 kg)  08/10/14 273 lb 6.4 oz (124.013 kg)  08/18/13 269 lb (122.018 kg)     Other studies Reviewed: Additional studies/ records that were reviewed today include: . Review of the above records demonstrates:    Assessment and Plan:   1. CAD:  No recent chest pains that are worrisome for angina. Stress myoview March 2015 without ischemia. Continue medical management with ASA and statin. I have personally reviewed his EKG today.   2. HTN: BP elevated. Will add Norvasc 5 mg daily.   3. HLD: Continue statin. Lipids followed in primary care and well controlled per pt.   4. Palpitations: He has had no recurrence of palpitations. No arrythmias on event monitor in 2015. Likely premature beats.   5. Anxiety: Will start Valium 2mg  to use every 8 hours prn for anxiety.   6. Obesity: I have spent 15 minutes of the visit today discussing diet and weight loss, including helping with meal planning and discussing low calorie options.    Current medicines are reviewed at length with the patient today.  The patient does not have concerns regarding medicines.  The following changes have been made:  Added Norvasc.   Labs/ tests ordered today include:   Orders Placed This Encounter  Procedures  . EKG 12-Lead    Disposition:   FU with me in 12  months  Signed, Lauree Chandler, MD 07/11/2015 9:43 AM    [facility] [place], [place], [place]  [address] Phone: [phone]; Fax: [phone]

## 2015-07-11 NOTE — Patient Instructions (Signed)
Medication Instructions:  Your physician has recommended you make the following change in your medication:  Start Amlodipine 5 mg by mouth daily.   Labwork: none  Testing/Procedures: none  Follow-Up: Your physician wants you to follow-up in: 12 months.  You will receive a reminder letter in the mail two months in advance. If you don't receive a letter, please call our office to schedule the follow-up appointment.   Any Other Special Instructions Will Be Listed Below (If Applicable).     If you need a refill on your cardiac medications before your next appointment, please call your pharmacy.

## 2015-07-21 DIAGNOSIS — J111 Influenza due to unidentified influenza virus with other respiratory manifestations: Secondary | ICD-10-CM | POA: Diagnosis not present

## 2015-07-21 DIAGNOSIS — J209 Acute bronchitis, unspecified: Secondary | ICD-10-CM | POA: Diagnosis not present

## 2015-08-22 DIAGNOSIS — H2512 Age-related nuclear cataract, left eye: Secondary | ICD-10-CM | POA: Diagnosis not present

## 2015-08-22 DIAGNOSIS — H25812 Combined forms of age-related cataract, left eye: Secondary | ICD-10-CM | POA: Diagnosis not present

## 2015-08-22 DIAGNOSIS — H353131 Nonexudative age-related macular degeneration, bilateral, early dry stage: Secondary | ICD-10-CM | POA: Diagnosis not present

## 2015-12-12 ENCOUNTER — Encounter: Payer: Self-pay | Admitting: Gastroenterology

## 2015-12-19 DIAGNOSIS — Z125 Encounter for screening for malignant neoplasm of prostate: Secondary | ICD-10-CM | POA: Diagnosis not present

## 2015-12-19 DIAGNOSIS — E669 Obesity, unspecified: Secondary | ICD-10-CM | POA: Diagnosis not present

## 2015-12-19 DIAGNOSIS — F419 Anxiety disorder, unspecified: Secondary | ICD-10-CM | POA: Diagnosis not present

## 2015-12-19 DIAGNOSIS — R7301 Impaired fasting glucose: Secondary | ICD-10-CM | POA: Diagnosis not present

## 2015-12-19 DIAGNOSIS — K219 Gastro-esophageal reflux disease without esophagitis: Secondary | ICD-10-CM | POA: Diagnosis not present

## 2015-12-19 DIAGNOSIS — I2510 Atherosclerotic heart disease of native coronary artery without angina pectoris: Secondary | ICD-10-CM | POA: Diagnosis not present

## 2015-12-19 DIAGNOSIS — E785 Hyperlipidemia, unspecified: Secondary | ICD-10-CM | POA: Diagnosis not present

## 2015-12-19 DIAGNOSIS — Z79899 Other long term (current) drug therapy: Secondary | ICD-10-CM | POA: Diagnosis not present

## 2015-12-19 DIAGNOSIS — E039 Hypothyroidism, unspecified: Secondary | ICD-10-CM | POA: Diagnosis not present

## 2015-12-26 ENCOUNTER — Encounter: Payer: Self-pay | Admitting: Gastroenterology

## 2016-02-08 ENCOUNTER — Ambulatory Visit (AMBULATORY_SURGERY_CENTER): Payer: PPO | Admitting: *Deleted

## 2016-02-08 VITALS — Ht 74.0 in | Wt 267.0 lb

## 2016-02-08 DIAGNOSIS — Z86010 Personal history of colonic polyps: Secondary | ICD-10-CM

## 2016-02-08 MED ORDER — NA SULFATE-K SULFATE-MG SULF 17.5-3.13-1.6 GM/177ML PO SOLN: 1.0000 | Freq: Once | ORAL | 0 refills | Status: AC

## 2016-02-08 NOTE — Progress Notes (Signed)
No egg or soy allergy known to patient  No issues with past sedation with any surgeries  or procedures, no intubation problems  No diet pills per patient No home 02 use per patient  No blood thinners per patient  Pt denies issues with constipation  No A fib or A flutter  emmi video to e mail    

## 2016-02-13 ENCOUNTER — Encounter: Payer: Self-pay | Admitting: Gastroenterology

## 2016-02-21 ENCOUNTER — Ambulatory Visit (AMBULATORY_SURGERY_CENTER): Payer: PPO | Admitting: Gastroenterology

## 2016-02-21 ENCOUNTER — Encounter: Payer: Self-pay | Admitting: Gastroenterology

## 2016-02-21 VITALS — BP 116/71 | HR 61 | Temp 98.6°F | Resp 10 | Ht 74.0 in | Wt 267.0 lb

## 2016-02-21 DIAGNOSIS — D124 Benign neoplasm of descending colon: Secondary | ICD-10-CM

## 2016-02-21 DIAGNOSIS — Z86010 Personal history of colonic polyps: Secondary | ICD-10-CM | POA: Diagnosis not present

## 2016-02-21 DIAGNOSIS — K635 Polyp of colon: Secondary | ICD-10-CM

## 2016-02-21 DIAGNOSIS — K219 Gastro-esophageal reflux disease without esophagitis: Secondary | ICD-10-CM | POA: Diagnosis not present

## 2016-02-21 DIAGNOSIS — Z1211 Encounter for screening for malignant neoplasm of colon: Secondary | ICD-10-CM | POA: Diagnosis not present

## 2016-02-21 DIAGNOSIS — I10 Essential (primary) hypertension: Secondary | ICD-10-CM | POA: Diagnosis not present

## 2016-02-21 DIAGNOSIS — I2510 Atherosclerotic heart disease of native coronary artery without angina pectoris: Secondary | ICD-10-CM | POA: Diagnosis not present

## 2016-02-21 LAB — GLUCOSE, CAPILLARY
GLUCOSE-CAPILLARY: 102 mg/dL — AB (ref 65–99)
Glucose-Capillary: 121 mg/dL — ABNORMAL HIGH (ref 65–99)

## 2016-02-21 MED ORDER — SODIUM CHLORIDE 0.9 % IV SOLN: 500.0000 mL | INTRAVENOUS | Status: DC

## 2016-02-21 NOTE — Op Note (Signed)
[place] Patient Name: Peter Brooks Procedure Date: 02/21/2016 11:05 AM MRN: [id number] Endoscopist: Milus Banister , MD Age: 70 Referring MD:  Date of Birth: [birth year] Gender: Male Account #: [id number] Procedure:                Colonoscopy Indications:              High risk colon cancer surveillance: Personal                            history of colonic polyps; 3 small TAs removed [place]                            2005, 1.1cm SSA removed 2014 Dr. Ardis Hughs Medicines:                Monitored Anesthesia Care Procedure:                Pre-Anesthesia Assessment:                           - Prior to the procedure, a History and Physical                            was performed, and patient medications and                            allergies were reviewed. The patient's tolerance of                            previous anesthesia was also reviewed. The risks                            and benefits of the procedure and the sedation                            options and risks were discussed with the patient.                            All questions were answered, and informed consent                            was obtained. Prior Anticoagulants: The patient has                            taken no previous anticoagulant or antiplatelet                            agents. ASA Grade Assessment: II - A patient with                            mild systemic disease. After reviewing the risks                            and benefits, the patient was deemed in  satisfactory condition to undergo the procedure.                           After obtaining informed consent, the colonoscope                            was passed under direct vision. Throughout the                            procedure, the patient's blood pressure, pulse, and                            oxygen saturations were monitored continuously. The                            Model CF-HQ190L  [id number]) scope was introduced                            through the anus and advanced to the the cecum,                            identified by appendiceal orifice and ileocecal                            valve. The colonoscopy was performed without                            difficulty. The patient tolerated the procedure                            well. The quality of the bowel preparation was                            good. The ileocecal valve, appendiceal orifice, and                            rectum were photographed. Scope In: 11:07:34 AM Scope Out: 11:17:34 AM Scope Withdrawal Time: 0 hours 8 minutes 17 seconds  Total Procedure Duration: 0 hours 10 minutes 0 seconds  Findings:                 A 8 mm polyp was found in the proximal descending                            colon. The polyp was sessile. The polyp was removed                            with a hot snare. Resection and retrieval were                            complete.                           The exam was otherwise without abnormality on  direct and retroflexion views. Complications:            No immediate complications. Estimated blood loss:                            None. Estimated Blood Loss:     Estimated blood loss: none. Impression:               - One 8 mm polyp in the proximal descending colon,                            removed with a hot snare. Resected and retrieved.                           - The examination was otherwise normal on direct                            and retroflexion views. Recommendation:           - Patient has a contact number available for                            emergencies. The signs and symptoms of potential                            delayed complications were discussed with the                            patient. Return to normal activities tomorrow.                            Written discharge instructions were provided to the                             patient.                           - Resume previous diet.                           - Continue present medications.                           You will receive a letter within 2-3 weeks with the                            pathology results and my final recommendations.                           If the polyp(s) is proven to be 'pre-cancerous' on                            pathology, you will need repeat colonoscopy in 5                            years. Milus Banister, MD 02/21/2016 11:20:51  AM This report has been signed electronically.

## 2016-02-21 NOTE — Progress Notes (Signed)
Called to room to assist during endoscopic procedure.  Patient ID and intended procedure confirmed with present staff. Received instructions for my participation in the procedure from the performing physician.  

## 2016-02-21 NOTE — Patient Instructions (Signed)
YOU HAD AN ENDOSCOPIC PROCEDURE TODAY AT THE [facility]:   Refer to the procedure report that was given to you for any specific questions about what was found during the examination.  If the procedure report does not answer your questions, please call your gastroenterologist to clarify.  If you requested that your care partner not be given the details of your procedure findings, then the procedure report has been included in a sealed envelope for you to review at your convenience later.  YOU SHOULD EXPECT: Some feelings of bloating in the abdomen. Passage of more gas than usual.  Walking can help get rid of the air that was put into your GI tract during the procedure and reduce the bloating. If you had a lower endoscopy (such as a colonoscopy or flexible sigmoidoscopy) you may notice spotting of blood in your stool or on the toilet paper. If you underwent a bowel prep for your procedure, you may not have a normal bowel movement for a few days.  Please Note:  You might notice some irritation and congestion in your nose or some drainage.  This is from the oxygen used during your procedure.  There is no need for concern and it should clear up in a day or so.  SYMPTOMS TO REPORT IMMEDIATELY:   Following lower endoscopy (colonoscopy or flexible sigmoidoscopy):  Excessive amounts of blood in the stool  Significant tenderness or worsening of abdominal pains  Swelling of the abdomen that is new, acute  Fever of 100F or higher    For urgent or emergent issues, a gastroenterologist can be reached at any hour by calling [phone].   DIET:  We do recommend a small meal at first, but then you may proceed to your regular diet.  Drink plenty of fluids but you should avoid alcoholic beverages for 24 hours.  ACTIVITY:  You should plan to take it easy for the rest of today and you should NOT DRIVE or use heavy machinery until tomorrow (because of the sedation medicines used during the test).     FOLLOW UP: Our staff will call the number listed on your records the next business day following your procedure to check on you and address any questions or concerns that you may have regarding the information given to you following your procedure. If we do not reach you, we will leave a message.  However, if you are feeling well and you are not experiencing any problems, there is no need to return our call.  We will assume that you have returned to your regular daily activities without incident.  If any biopsies were taken you will be contacted by phone or by letter within the next 1-3 weeks.  Please call us at [phone] if you have not heard about the biopsies in 3 weeks.    SIGNATURES/CONFIDENTIALITY: You and/or your care partner have signed paperwork which will be entered into your electronic medical record.  These signatures attest to the fact that that the information above on your After Visit Summary has been reviewed and is understood.  Full responsibility of the confidentiality of this discharge information lies with you and/or your care-partner.   Resume medications. Information given on polyps. 

## 2016-02-22 ENCOUNTER — Telehealth: Payer: Self-pay

## 2016-02-22 NOTE — Telephone Encounter (Signed)
  Follow up Call-  Call back number 02/21/2016  Post procedure Call Back phone  # [phone]  Permission to leave phone message Yes  Some recent data might be hidden     Patient questions:  Do you have a fever, pain , or abdominal swelling? No. Pain Score  0 *  Have you tolerated food without any problems? Yes.    Have you been able to return to your normal activities? Yes.    Do you have any questions about your discharge instructions: Diet   No. Medications  No. Follow up visit  No.  Do you have questions or concerns about your Care? No.  Actions: * If pain score is 4 or above: No action needed, pain <4.

## 2016-03-01 ENCOUNTER — Encounter: Payer: Self-pay | Admitting: Gastroenterology

## 2016-03-20 DIAGNOSIS — Z23 Encounter for immunization: Secondary | ICD-10-CM | POA: Diagnosis not present

## 2016-03-27 DIAGNOSIS — M25562 Pain in left knee: Secondary | ICD-10-CM | POA: Diagnosis not present

## 2016-03-27 DIAGNOSIS — M1712 Unilateral primary osteoarthritis, left knee: Secondary | ICD-10-CM | POA: Diagnosis not present

## 2016-06-18 DIAGNOSIS — J111 Influenza due to unidentified influenza virus with other respiratory manifestations: Secondary | ICD-10-CM | POA: Diagnosis not present

## 2016-06-18 DIAGNOSIS — E119 Type 2 diabetes mellitus without complications: Secondary | ICD-10-CM | POA: Diagnosis not present

## 2016-07-22 ENCOUNTER — Other Ambulatory Visit: Payer: Self-pay | Admitting: Cardiovascular Disease

## 2016-07-28 ENCOUNTER — Other Ambulatory Visit: Payer: Self-pay | Admitting: Cardiovascular Disease

## 2016-07-31 ENCOUNTER — Telehealth: Payer: Self-pay | Admitting: *Deleted

## 2016-07-31 NOTE — Telephone Encounter (Signed)
Patients wife called and requested a refill on amlodipine 5 mg for the patient. This medication is not listed on patients current med list. It was removed by Hetty Ely, RN at [facility] on 02/08/16 with a reason of change in therapy as below.   Progress Notes   Steva Ready, RN at 02/08/2016 2:30 PM   Status: Signed           No notes of this type exist for this encounter.  [facility] Patient Outreach   No notes of this type exist for this encounter.  Not recorded  Medications Ordered This Encounter    Disp Refills Start End  Na Sulfate-K Sulfate-Mg Sulf (SUPREP BOWEL PREP KIT) 17.5-3.13-1.6 GM/180ML SOLN 354 mL 0 02/08/2016 02/08/2016  Take 1 kit by mouth once. suprep as directed. No substitutions - Oral  Discontinued Medications    Reason for Discontinue  amLODipine (NORVASC) 5 MG tablet Change in therapy   Patient has been taking. Okay to refill? Please advise. Thanks, MI

## 2016-08-01 ENCOUNTER — Other Ambulatory Visit: Payer: Self-pay | Admitting: *Deleted

## 2016-08-01 MED ORDER — AMLODIPINE BESYLATE 5 MG PO TABS: 5.0000 mg | ORAL_TABLET | Freq: Every day | ORAL | 0 refills | Status: DC

## 2016-08-01 NOTE — Telephone Encounter (Signed)
OK to refill. cdm 

## 2016-08-01 NOTE — Telephone Encounter (Signed)
RX sent in. Patients wife aware and very appreciative of my call.

## 2016-08-02 ENCOUNTER — Encounter: Payer: Self-pay | Admitting: Cardiovascular Disease

## 2016-08-02 ENCOUNTER — Ambulatory Visit (INDEPENDENT_AMBULATORY_CARE_PROVIDER_SITE_OTHER): Payer: PPO | Admitting: Cardiovascular Disease

## 2016-08-02 VITALS — BP 132/88 | HR 70 | Ht 74.0 in | Wt 260.4 lb

## 2016-08-02 DIAGNOSIS — E7800 Pure hypercholesterolemia, unspecified: Secondary | ICD-10-CM

## 2016-08-02 DIAGNOSIS — I10 Essential (primary) hypertension: Secondary | ICD-10-CM

## 2016-08-02 DIAGNOSIS — R002 Palpitations: Secondary | ICD-10-CM | POA: Diagnosis not present

## 2016-08-02 DIAGNOSIS — I2510 Atherosclerotic heart disease of native coronary artery without angina pectoris: Secondary | ICD-10-CM

## 2016-08-02 NOTE — Progress Notes (Signed)
Chief Complaint  Patient presents with  . essential hypertension,benign    follow up     History of Present Illness: 71 yo WM with history of CAD, HTN, HLD, GERD here today for cardiac follow up.  Last cardiac cath June 2010 with proximal LAD 40%, mid LAD 30%, irregularities in Circumflex and RCA, EF 60%. He was admitted to [facility] in March 2013 with dizziness. Head CT at [facility] was negative. He was transferred to the cardiology service at [facility] where he ruled out for MI. He had no arrhythmias on telemetry. He was seen in January 2015 and had c/o heart racing, dizziness, fatigue. Stress myoview 07/27/13 without ischemia. Echo 07/27/13 with normal LV function, no significant valve issues. Event monitor without any arrythmias.   He is here today for cardiac follow up. No chest pain or SOB. He denies palpitations, near syncope or syncope. He has lost 20 lbs over the last year and is working out.   Primary Care Physician: Christa See, MD  Past Medical History:  Diagnosis Date  . Anxiety   . Arthritis   . CAD (coronary artery disease)    nonobstructive CAD with 40% narrowing in the proximal LAD   . Cataract    removed bilaterally   . Chest pain   . Diabetes mellitus without complication ([place])   . Dizziness   . GERD (gastroesophageal reflux disease)   . Hyperlipidemia   . Hypertension   . Hypothyroidism   . Lumbar herniated disc     Past Surgical History:  Procedure Laterality Date  . CARDIAC CATHETERIZATION  11/11/2008  . CATARACT EXTRACTION Right   . CATARACT EXTRACTION Left   . COLONOSCOPY    . KNEE ARTHROSCOPY Bilateral 2010 Right  . POLYPECTOMY      Current Outpatient Prescriptions  Medication Sig Dispense Refill  . amLODipine (NORVASC) 5 MG tablet Take 1 tablet (5 mg total) by mouth daily. 30 tablet 0  . Ascorbic Acid (VITAMIN C) 1000 MG tablet Take 1,000 mg by mouth daily.    [name] Kitchen aspirin EC 81 MG tablet Take 81 mg by mouth daily.    . Calcium  Carbonate-Vitamin D (CALTRATE 600+D) 600-400 MG-UNIT per tablet Take 1 tablet by mouth daily.    . Coenzyme Q10 (COQ-10) 400 MG CAPS Take 400 mg by mouth daily.    . diazepam (VALIUM) 2 MG tablet Take 1 tablet (2 mg total) by mouth every 12 (twelve) hours as needed for anxiety. 30 tablet 1  . levothyroxine (SYNTHROID, LEVOTHROID) 50 MCG tablet Take 50 mcg by mouth daily.    . metFORMIN (GLUCOPHAGE) 500 MG tablet Take 500 mg by mouth 2 (two) times daily with a meal.     . Multiple Vitamin (MULITIVITAMIN WITH MINERALS) TABS Take 1 tablet by mouth daily.    . Multiple Vitamins-Minerals (PRESERVISION AREDS 2) CAPS Take 2 capsules by mouth daily.    [name] Kitchen NIACIN PO Take 1 Dose by mouth daily. Flash Free     . Omega-3 Fatty Acids (FISH OIL) 1200 MG CAPS Take 1,200 mg by mouth daily.    [name] Kitchen OVER THE COUNTER MEDICATION Take 1 capsule by mouth daily. Move free    . Probiotic Product (PROBIOTIC DAILY PO) Take 1 tablet by mouth daily.    . simvastatin (ZOCOR) 20 MG tablet Take 20 mg by mouth every evening.     Current Facility-Administered Medications  Medication Dose Route Frequency Provider Last Rate Last Dose  . 0.9 %  sodium chloride  infusion  500 mL Intravenous Continuous Milus Banister, MD        No Known Allergies  Social History   Social History  . Marital status: Married    Spouse name: N/A  . Number of children: N/A  . Years of education: N/A   Occupational History  . Not on file.   Social History Main Topics  . Smoking status: Never Smoker  . Smokeless tobacco: Never Used  . Alcohol use No  . Drug use: No  . Sexual activity: Yes     Comment: married   Other Topics Concern  . Not on file   Social History Narrative  . No narrative on file    Family History  Problem Relation Age of Onset  . Colon cancer Paternal Uncle 3  . Coronary artery disease Father     with CABG valve replacement in his 78's  . Heart attack Mother     in her 30's  . Esophageal cancer Neg Hx   .  Rectal cancer Neg Hx   . Stomach cancer Neg Hx   . Colon polyps Neg Hx     Review of Systems:  As stated in the HPI and otherwise negative.   BP 132/88   Pulse 70   Ht 6\' 2"  (1.88 m)   Wt 260 lb 6.4 oz (118.1 kg)   BMI 33.43 kg/m   Physical Examination: General: Well developed, well nourished, NAD  HEENT: OP clear, mucus membranes moist  SKIN: warm, dry. No rashes. Neuro: No focal deficits  Musculoskeletal: Muscle strength 5/5 all ext  Psychiatric: Mood and affect normal  Neck: No JVD, no carotid bruits, no thyromegaly, no lymphadenopathy.  Lungs:Clear bilaterally, no wheezes, rhonci, crackles Cardiovascular: Regular rate and rhythm. No murmurs, gallops or rubs. Abdomen:Soft. Bowel sounds present. Non-tender.  Extremities: No lower extremity edema. Pulses are 2 + in the bilateral DP/PT  Echo 07/27/13: Left ventricle: The cavity size was normal. Wall thickness was increased in a pattern of mild LVH. Systolic function was normal. The estimated ejection fraction was in the range of 60% to 65%. Wall motion was normal; there were no regional wall motion abnormalities. Doppler parameters are consistent with abnormal left ventricular relaxation (grade 1 diastolic dysfunction). - Aortic valve: Trileaflet; moderately calcified leaflets. - Mitral valve: Mildly calcified annulus. Mildly calcified leaflets . - Left atrium: The atrium was mildly dilated. - Right ventricle: The cavity size was normal. Systolic function was normal. - Tricuspid valve: Peak RV-RA gradient: 52mm Hg (S). - Pulmonary arteries: PA peak pressure: 54mm Hg (S). - Inferior vena cava: The vessel was normal in size; the respirophasic diameter changes were in the normal range (= 50%); findings are consistent with normal central venous pressure. Impressions:  - Normal LV size with mild LV hypertrophy. EF 60-65%. Normal RV size and systolic function. Aortic sclerosis without stenosis.  Stress myoview  07/27/13: Stress Procedure: The patient exercised on the treadmill utilizing the Bruce Protocol for 7:30 minutes. The patient stopped due to fatigue and denied any chest pain. Technetium 77m Sestamibi was injected at peak exercise and myocardial perfusion imaging was performed after a brief delay.  Stress ECG: No significant change from baseline ECG  QPS  Raw Data Images: SOft tissue (diaphragm, subcutaneous fat) surround heart.  Stress Images:Thinning in the inferior, inferolateral base. Otherwise normal perfusion.  Rest Images: Comparison with the stress images reveals no significant change.  Subtraction (SDS): No evidence of ischemia.  Transient Ischemic Dilatation (Normal <1.22): 1.00  Lung/Heart Ratio (Normal <0.45): 0.33  Quantitative Gated Spect Images  QGS EDV: 119 ml  QGS ESV: 49 ml  Impression  Exercise Capacity: Good exercise capacity.  BP Response: Normal blood pressure response.  Clinical Symptoms: No chest pain.  ECG Impression: No significant ST segment change suggestive of ischemia.  Comparison with Prior Nuclear Study:Prior study done in [place] No report to compare.  Overall Impression: Probable normal perfusion and minimal soft tissue attenuation. No evidence for significant ischemia or scar.  LV Ejection Fraction: 59%. LV Wall Motion: NL LV Function; NL Wall Motion  EKG:  EKG is ordered today. The ekg ordered today demonstrates NSR, rate 70 bpm. IVCD  Recent Labs: No results found for requested labs within last 8760 hours.   Lipid Panel   Wt Readings from Last 3 Encounters:  08/02/16 260 lb 6.4 oz (118.1 kg)  02/21/16 267 lb (121.1 kg)  02/08/16 267 lb (121.1 kg)     Other studies Reviewed: Additional studies/ records that were reviewed today include: . Review of the above records demonstrates:    Assessment and Plan:   1. CAD without angina:  No recent chest pains that are worrisome for angina. Stress myoview March 2015 without ischemia. Continue medical  management with ASA and statin.    2. HTN: BP is controlled. He has been out of Norvasc.   3. HLD: He has not tolerated Zocor 40 mg daily after recent increase in primary care. He will reduce dose to 20 mg daily. He will call primary care. May consider changing to Crestor 10 mg daiily.    4. Palpitations: He has had no recurrence of palpitations. No arrythmias on event monitor in 2015. Likely premature beats.   5. Morbid Obesity: He has lost 20 lbs over the last year. Weight loss counseling today.  He is trying to lose weight.   Current medicines are reviewed at length with the patient today.  The patient does not have concerns regarding medicines.  The following changes have been made:  Added Norvasc.   Labs/ tests ordered today include:   Orders Placed This Encounter  Procedures  . EKG 12-Lead    Disposition:   FU with me in 12  months  Signed, Lauree Chandler, MD 08/02/2016 1:43 PM    [facility] [place], [place], [place]  [address] Phone: [phone]; Fax: [phone]

## 2016-08-02 NOTE — Patient Instructions (Signed)

## 2016-08-27 ENCOUNTER — Other Ambulatory Visit: Payer: Self-pay | Admitting: Cardiovascular Disease

## 2016-11-29 DIAGNOSIS — E785 Hyperlipidemia, unspecified: Secondary | ICD-10-CM | POA: Diagnosis not present

## 2016-11-29 DIAGNOSIS — Z202 Contact with and (suspected) exposure to infections with a predominantly sexual mode of transmission: Secondary | ICD-10-CM | POA: Diagnosis not present

## 2016-11-29 DIAGNOSIS — I2510 Atherosclerotic heart disease of native coronary artery without angina pectoris: Secondary | ICD-10-CM | POA: Diagnosis not present

## 2016-11-29 DIAGNOSIS — E119 Type 2 diabetes mellitus without complications: Secondary | ICD-10-CM | POA: Diagnosis not present

## 2016-11-29 DIAGNOSIS — E039 Hypothyroidism, unspecified: Secondary | ICD-10-CM | POA: Diagnosis not present

## 2016-11-29 DIAGNOSIS — K219 Gastro-esophageal reflux disease without esophagitis: Secondary | ICD-10-CM | POA: Diagnosis not present

## 2016-11-29 DIAGNOSIS — Z0000 Encounter for general adult medical examination without abnormal findings: Secondary | ICD-10-CM | POA: Diagnosis not present

## 2016-11-29 DIAGNOSIS — N401 Enlarged prostate with lower urinary tract symptoms: Secondary | ICD-10-CM | POA: Diagnosis not present

## 2016-11-29 DIAGNOSIS — F419 Anxiety disorder, unspecified: Secondary | ICD-10-CM | POA: Diagnosis not present

## 2016-11-29 DIAGNOSIS — Z125 Encounter for screening for malignant neoplasm of prostate: Secondary | ICD-10-CM | POA: Diagnosis not present

## 2016-11-29 DIAGNOSIS — Z7984 Long term (current) use of oral hypoglycemic drugs: Secondary | ICD-10-CM | POA: Diagnosis not present

## 2016-11-29 DIAGNOSIS — R351 Nocturia: Secondary | ICD-10-CM | POA: Diagnosis not present

## 2017-01-25 DIAGNOSIS — H353131 Nonexudative age-related macular degeneration, bilateral, early dry stage: Secondary | ICD-10-CM | POA: Diagnosis not present

## 2017-01-25 DIAGNOSIS — H52223 Regular astigmatism, bilateral: Secondary | ICD-10-CM | POA: Diagnosis not present

## 2017-01-25 DIAGNOSIS — E119 Type 2 diabetes mellitus without complications: Secondary | ICD-10-CM | POA: Diagnosis not present

## 2017-03-08 DIAGNOSIS — Z23 Encounter for immunization: Secondary | ICD-10-CM | POA: Diagnosis not present

## 2017-04-06 DIAGNOSIS — J0100 Acute maxillary sinusitis, unspecified: Secondary | ICD-10-CM | POA: Diagnosis not present

## 2017-04-06 DIAGNOSIS — J209 Acute bronchitis, unspecified: Secondary | ICD-10-CM | POA: Diagnosis not present

## 2017-07-05 DIAGNOSIS — R6889 Other general symptoms and signs: Secondary | ICD-10-CM | POA: Diagnosis not present

## 2017-07-05 DIAGNOSIS — J101 Influenza due to other identified influenza virus with other respiratory manifestations: Secondary | ICD-10-CM | POA: Diagnosis not present

## 2017-07-05 DIAGNOSIS — Z6837 Body mass index (BMI) 37.0-37.9, adult: Secondary | ICD-10-CM | POA: Diagnosis not present

## 2017-08-01 ENCOUNTER — Ambulatory Visit: Payer: PPO | Admitting: Cardiovascular Disease

## 2017-08-01 ENCOUNTER — Encounter: Payer: Self-pay | Admitting: Cardiovascular Disease

## 2017-08-01 VITALS — BP 132/86 | HR 70 | Ht 74.0 in | Wt 276.8 lb

## 2017-08-01 DIAGNOSIS — I2510 Atherosclerotic heart disease of native coronary artery without angina pectoris: Secondary | ICD-10-CM | POA: Diagnosis not present

## 2017-08-01 DIAGNOSIS — I10 Essential (primary) hypertension: Secondary | ICD-10-CM

## 2017-08-01 DIAGNOSIS — E7800 Pure hypercholesterolemia, unspecified: Secondary | ICD-10-CM | POA: Diagnosis not present

## 2017-08-01 NOTE — Patient Instructions (Signed)

## 2017-08-01 NOTE — Progress Notes (Signed)
Chief Complaint  Patient presents with  . Coronary Artery Disease     History of Present Illness: 72 yo male with history of CAD, HTN, HLD, GERD here today for cardiac follow up.  Last cardiac cath June 2010 with proximal LAD 40%, mid LAD 30%, irregularities in Circumflex and RCA, EF 60%.  He was seen in January 2015 and had c/o heart racing, dizziness, fatigue. Stress myoview 07/27/13 without ischemia. Echo 07/27/13 with normal LV function, no significant valve issues. Event monitor without any arrythmias.   He is here today for follow up. The patient denies any chest pain, dyspnea, palpitations, lower extremity edema, orthopnea, PND, dizziness, near syncope or syncope. He feels great.   Primary Care Physician: Street, Sharon Mt, MD  Past Medical History:  Diagnosis Date  . Anxiety   . Arthritis   . CAD (coronary artery disease)    nonobstructive CAD with 40% narrowing in the proximal LAD   . Cataract    removed bilaterally   . Chest pain   . Diabetes mellitus without complication ([place])   . Dizziness   . GERD (gastroesophageal reflux disease)   . Hyperlipidemia   . Hypertension   . Hypothyroidism   . Lumbar herniated disc     Past Surgical History:  Procedure Laterality Date  . CARDIAC CATHETERIZATION  11/11/2008  . CATARACT EXTRACTION Right   . CATARACT EXTRACTION Left   . COLONOSCOPY    . KNEE ARTHROSCOPY Bilateral 2010 Right  . POLYPECTOMY      Current Outpatient Medications  Medication Sig Dispense Refill  . amLODipine (NORVASC) 5 MG tablet TAKE 1 TABLET BY MOUTH ONCE DAILY 90 tablet 3  . Ascorbic Acid (VITAMIN C) 1000 MG tablet Take 1,000 mg by mouth daily.    [name] Kitchen aspirin EC 81 MG tablet Take 81 mg by mouth daily.    . Calcium Carbonate-Vitamin D (CALTRATE 600+D) 600-400 MG-UNIT per tablet Take 1 tablet by mouth daily.    . Coenzyme Q10 (COQ-10) 200 MG CAPS Take 200 mg by mouth daily.     . diazepam (VALIUM) 2 MG tablet Take 1 tablet (2 mg total) by mouth every  12 (twelve) hours as needed for anxiety. 30 tablet 1  . levothyroxine (SYNTHROID, LEVOTHROID) 50 MCG tablet Take 50 mcg by mouth daily.    . metFORMIN (GLUCOPHAGE) 500 MG tablet Take 500 mg by mouth 2 (two) times daily with a meal.     . Multiple Vitamin (MULITIVITAMIN WITH MINERALS) TABS Take 1 tablet by mouth daily.    . Multiple Vitamins-Minerals (PRESERVISION AREDS 2) CAPS Take 2 capsules by mouth daily.    [name] Kitchen NIACIN PO Take 500 mg by mouth daily. Flash Free     . Omega-3 Fatty Acids (FISH OIL) 1200 MG CAPS Take 1,200 mg by mouth daily.    [name] Kitchen OVER THE COUNTER MEDICATION Take 1 capsule by mouth daily. Move free    . Probiotic Product (PROBIOTIC DAILY PO) Take 1 tablet by mouth daily.    . simvastatin (ZOCOR) 20 MG tablet Take 20 mg by mouth every evening.     Current Facility-Administered Medications  Medication Dose Route Frequency Provider Last Rate Last Dose  . 0.9 %  sodium chloride infusion  500 mL Intravenous Continuous Milus Banister, MD        No Known Allergies  Social History   Socioeconomic History  . Marital status: Married    Spouse name: Not on file  . Number of children: Not  on file  . Years of education: Not on file  . Highest education level: Not on file  Social Needs  . Financial resource strain: Not on file  . Food insecurity - worry: Not on file  . Food insecurity - inability: Not on file  . Transportation needs - medical: Not on file  . Transportation needs - non-medical: Not on file  Occupational History  . Not on file  Tobacco Use  . Smoking status: Never Smoker  . Smokeless tobacco: Never Used  Substance and Sexual Activity  . Alcohol use: No  . Drug use: No  . Sexual activity: Yes    Comment: married  Other Topics Concern  . Not on file  Social History Narrative  . Not on file    Family History  Problem Relation Age of Onset  . Colon cancer Paternal Uncle 62  . Coronary artery disease Father        with CABG valve replacement in his 50's   . Heart attack Mother        in her 7's  . Esophageal cancer Neg Hx   . Rectal cancer Neg Hx   . Stomach cancer Neg Hx   . Colon polyps Neg Hx     Review of Systems:  As stated in the HPI and otherwise negative.   BP 132/86   Pulse 70   Ht 6\' 2"  (1.88 m)   Wt 276 lb 12.8 oz (125.6 kg)   SpO2 95%   BMI 35.54 kg/m   Physical Examination:  General: Well developed, well nourished, NAD  HEENT: OP clear, mucus membranes moist  SKIN: warm, dry. No rashes. Neuro: No focal deficits  Musculoskeletal: Muscle strength 5/5 all ext  Psychiatric: Mood and affect normal  Neck: No JVD, no carotid bruits, no thyromegaly, no lymphadenopathy.  Lungs:Clear bilaterally, no wheezes, rhonci, crackles Cardiovascular: Regular rate and rhythm. No murmurs, gallops or rubs. Abdomen:Soft. Bowel sounds present. Non-tender.  Extremities: No lower extremity edema. Pulses are 2 + in the bilateral DP/PT.  Echo 07/27/13: Left ventricle: The cavity size was normal. Wall thickness was increased in a pattern of mild LVH. Systolic function was normal. The estimated ejection fraction was in the range of 60% to 65%. Wall motion was normal; there were no regional wall motion abnormalities. Doppler parameters are consistent with abnormal left ventricular relaxation (grade 1 diastolic dysfunction). - Aortic valve: Trileaflet; moderately calcified leaflets. - Mitral valve: Mildly calcified annulus. Mildly calcified leaflets . - Left atrium: The atrium was mildly dilated. - Right ventricle: The cavity size was normal. Systolic function was normal. - Tricuspid valve: Peak RV-RA gradient: 14mm Hg (S). - Pulmonary arteries: PA peak pressure: 42mm Hg (S). - Inferior vena cava: The vessel was normal in size; the respirophasic diameter changes were in the normal range (= 50%); findings are consistent with normal central venous pressure. Impressions:  - Normal LV size with mild LV hypertrophy. EF 60-65%. Normal RV  size and systolic function. Aortic sclerosis without stenosis.  EKG:  EKG is ordered today. The ekg ordered today demonstrates NSR, rate 70 bpm.   Recent Labs: No results found for requested labs within last 8760 hours.   Lipid Panel   Wt Readings from Last 3 Encounters:  08/01/17 276 lb 12.8 oz (125.6 kg)  08/02/16 260 lb 6.4 oz (118.1 kg)  02/21/16 267 lb (121.1 kg)     Other studies Reviewed: Additional studies/ records that were reviewed today include: . Review of the above  records demonstrates:    Assessment and Plan:   1. CAD without angina:  No chest pain suggestive of angina. He is known to have mild to moderate CAD by cath in June 2010. Will continue ASA and statin.     2. HTN: BP is well controlled. No changes today.   3. HLD:  He is taking Zocor 20 mg daily. Lipids followed in primary care.    Current medicines are reviewed at length with the patient today.  The patient does not have concerns regarding medicines.  The following changes have been made:  Added Norvasc.   Labs/ tests ordered today include:   No orders of the defined types were placed in this encounter.   Disposition:   FU with me in 12  months  Signed, Lauree Chandler, MD 08/01/2017 3:29 PM    [facility] [place], [place], [place]  [address] Phone: [phone]; Fax: [phone]

## 2017-08-05 NOTE — Addendum Note (Signed)
Addended by: Mendel Ryder on: 08/05/2017 07:16 AM   Modules accepted: Orders

## 2017-08-13 ENCOUNTER — Other Ambulatory Visit: Payer: Self-pay | Admitting: Cardiovascular Disease

## 2017-09-05 DIAGNOSIS — E119 Type 2 diabetes mellitus without complications: Secondary | ICD-10-CM | POA: Diagnosis not present

## 2017-09-05 DIAGNOSIS — E039 Hypothyroidism, unspecified: Secondary | ICD-10-CM | POA: Diagnosis not present

## 2017-09-05 DIAGNOSIS — K219 Gastro-esophageal reflux disease without esophagitis: Secondary | ICD-10-CM | POA: Diagnosis not present

## 2017-09-05 DIAGNOSIS — R351 Nocturia: Secondary | ICD-10-CM | POA: Diagnosis not present

## 2017-09-05 DIAGNOSIS — F419 Anxiety disorder, unspecified: Secondary | ICD-10-CM | POA: Diagnosis not present

## 2017-09-05 DIAGNOSIS — Z7984 Long term (current) use of oral hypoglycemic drugs: Secondary | ICD-10-CM | POA: Diagnosis not present

## 2017-09-05 DIAGNOSIS — N401 Enlarged prostate with lower urinary tract symptoms: Secondary | ICD-10-CM | POA: Diagnosis not present

## 2017-09-05 DIAGNOSIS — I2510 Atherosclerotic heart disease of native coronary artery without angina pectoris: Secondary | ICD-10-CM | POA: Diagnosis not present

## 2017-09-05 DIAGNOSIS — E785 Hyperlipidemia, unspecified: Secondary | ICD-10-CM | POA: Diagnosis not present

## 2017-09-05 DIAGNOSIS — Z6837 Body mass index (BMI) 37.0-37.9, adult: Secondary | ICD-10-CM | POA: Diagnosis not present

## 2017-09-26 DIAGNOSIS — M1711 Unilateral primary osteoarthritis, right knee: Secondary | ICD-10-CM | POA: Diagnosis not present

## 2017-09-26 DIAGNOSIS — M25561 Pain in right knee: Secondary | ICD-10-CM | POA: Diagnosis not present

## 2017-09-26 DIAGNOSIS — M1712 Unilateral primary osteoarthritis, left knee: Secondary | ICD-10-CM | POA: Diagnosis not present

## 2017-09-26 DIAGNOSIS — M170 Bilateral primary osteoarthritis of knee: Secondary | ICD-10-CM | POA: Diagnosis not present

## 2017-11-28 DIAGNOSIS — M1711 Unilateral primary osteoarthritis, right knee: Secondary | ICD-10-CM | POA: Diagnosis not present

## 2017-11-28 DIAGNOSIS — M25561 Pain in right knee: Secondary | ICD-10-CM | POA: Diagnosis not present

## 2017-12-06 DIAGNOSIS — M25562 Pain in left knee: Secondary | ICD-10-CM | POA: Diagnosis not present

## 2017-12-06 DIAGNOSIS — M1711 Unilateral primary osteoarthritis, right knee: Secondary | ICD-10-CM | POA: Diagnosis not present

## 2017-12-06 DIAGNOSIS — M1712 Unilateral primary osteoarthritis, left knee: Secondary | ICD-10-CM | POA: Diagnosis not present

## 2017-12-13 DIAGNOSIS — J329 Chronic sinusitis, unspecified: Secondary | ICD-10-CM | POA: Diagnosis not present

## 2017-12-13 DIAGNOSIS — Z6836 Body mass index (BMI) 36.0-36.9, adult: Secondary | ICD-10-CM | POA: Diagnosis not present

## 2017-12-19 DIAGNOSIS — M1711 Unilateral primary osteoarthritis, right knee: Secondary | ICD-10-CM | POA: Diagnosis not present

## 2018-01-06 ENCOUNTER — Telehealth: Payer: Self-pay | Admitting: Cardiovascular Disease

## 2018-01-06 DIAGNOSIS — R002 Palpitations: Secondary | ICD-10-CM

## 2018-01-06 NOTE — Telephone Encounter (Signed)
Can we check a BMET, CBC and TSH and arrange a 48 hour cardiac monitor?   Thanks, chris

## 2018-01-06 NOTE — Telephone Encounter (Signed)
Left message for pt to call office

## 2018-01-06 NOTE — Telephone Encounter (Signed)
New message      1) How long have you had palpitations/irregular HR/ Afib? Are you having the symptoms now? 3 days of palpitations off and on  2) Are you currently experiencing lightheadedness, SOB or CP? SOB at times, not currently  3) Do you have a history of afib (atrial fibrillation) or irregular heart rhythm? no  4) Have you checked your BP or HR? (document readings if available): 128/78 HR 70  5) Are you experiencing any other symptoms? no

## 2018-01-06 NOTE — Telephone Encounter (Signed)
I spoke with pt and scheduled him for holter monitor appointment and lab work on August 20,2019 at 2:30

## 2018-01-06 NOTE — Telephone Encounter (Signed)
I spoke with pt. He reports off and on palpitations for the last 3 days. Some shortness of breath and lightheadedness with palpitations. He can feel that heart rate is fast at times.  His wife is a retired [employer] scientist and when listening to his heart she can hear it pounding during episodes. Last episode was last night. Today he is feeling fine.  No recent increase in caffeine. Drinks 2 cups of coffee every morning. No change in medications in last few days.  He did have sinus, ear and throat infection recently which was treated with 2 rounds of antibiotics.  He has been off antibiotics for 2 weeks. He did not have palpitations when sick. He is getting injections in his knees.  No recent thyroid labs. Last checked about a year ago. He has worn a monitor in 2010 and 2015.  Palpitations that he is having now are similar to what he had at those times. Pt also reports shortness of breath with exertion for last 3 months. Happens when doing things like using the weed eater in his yard. No chest pain. Will send to Dr. Angelena Form for review/recommendations. If lab work needed pt would like to have done in our office.

## 2018-01-07 ENCOUNTER — Other Ambulatory Visit: Payer: PPO

## 2018-01-07 ENCOUNTER — Ambulatory Visit (INDEPENDENT_AMBULATORY_CARE_PROVIDER_SITE_OTHER): Payer: PPO

## 2018-01-07 DIAGNOSIS — R002 Palpitations: Secondary | ICD-10-CM

## 2018-01-08 LAB — CBC
Hematocrit: 40.1 % (ref 37.5–51.0)
Hemoglobin: 12.9 g/dL — ABNORMAL LOW (ref 13.0–17.7)
MCH: 28.7 pg (ref 26.6–33.0)
MCHC: 32.2 g/dL (ref 31.5–35.7)
MCV: 89 fL (ref 79–97)
PLATELETS: 277 10*3/uL (ref 150–450)
RBC: 4.5 x10E6/uL (ref 4.14–5.80)
RDW: 15 % (ref 12.3–15.4)
WBC: 6.8 10*3/uL (ref 3.4–10.8)

## 2018-01-08 LAB — BASIC METABOLIC PANEL
BUN/Creatinine Ratio: 13 (ref 10–24)
BUN: 13 mg/dL (ref 8–27)
CALCIUM: 9.5 mg/dL (ref 8.6–10.2)
CHLORIDE: 101 mmol/L (ref 96–106)
CO2: 24 mmol/L (ref 20–29)
Creatinine, Ser: 1.01 mg/dL (ref 0.76–1.27)
GFR calc Af Amer: 85 mL/min/{1.73_m2} (ref >59)
GFR, EST NON AFRICAN AMERICAN: 73 mL/min/{1.73_m2} (ref >59)
Glucose: 129 mg/dL — ABNORMAL HIGH (ref 65–99)
Potassium: 4.4 mmol/L (ref 3.5–5.2)
Sodium: 140 mmol/L (ref 134–144)

## 2018-01-08 LAB — TSH: TSH: 2.15 u[IU]/mL (ref 0.450–4.500)

## 2018-01-09 DIAGNOSIS — M1712 Unilateral primary osteoarthritis, left knee: Secondary | ICD-10-CM | POA: Diagnosis not present

## 2018-01-13 ENCOUNTER — Telehealth: Payer: Self-pay | Admitting: *Deleted

## 2018-01-13 MED ORDER — METOPROLOL SUCCINATE ER 25 MG PO TB24: 25.0000 mg | ORAL_TABLET | Freq: Every day | ORAL | 11 refills | Status: DC

## 2018-01-13 NOTE — Telephone Encounter (Signed)
-----   Message from Burnell Blanks, MD sent at 01/13/2018  3:40 PM EDT ----- See monitor. PVCs and PACs with short run of SVT and 4 beats VT. I would recommend that we start Toprol 25 mg po daily. Avoid stimulants. cdm

## 2018-01-13 NOTE — Telephone Encounter (Signed)
Notes recorded by Thompson Grayer, RN on 01/13/2018 at 4:41 PM EDT I spoke with pt and reviewed monitor results and recommendations from Dr. Angelena Form with him. He would like to start Toprol. Will send prescription to [facility] in [place]. Pt saw Dr. Angelena Form in March 2019 with one year follow up planned. I offered to schedule a sooner appointment this fall if he would like. Pt would prefer to start Toprol and see how he does. He will call if he wants appointment sooner than March 2020

## 2018-01-16 DIAGNOSIS — M1712 Unilateral primary osteoarthritis, left knee: Secondary | ICD-10-CM | POA: Diagnosis not present

## 2018-01-23 DIAGNOSIS — M1712 Unilateral primary osteoarthritis, left knee: Secondary | ICD-10-CM | POA: Diagnosis not present

## 2018-03-03 ENCOUNTER — Telehealth: Payer: Self-pay | Admitting: Cardiovascular Disease

## 2018-03-03 NOTE — Telephone Encounter (Signed)
I spoke with pt. Readings listed below are from yesterday afternoon. Pt reports he felt dizzy when getting up from his recliner. Almost fell.  BP was elevated at that time.  Had [place] food for lunch. Is staying hydrated. Today he is feeling fine. BP was 170/62 prior to taking AM medications. Pt also reports shortness of breath for last few months.  Occurs when walking his dog.  Occasional twinges of chest pain. I discussed orthostatic precautions with pt.  I scheduled pt to see Dr. Angelena Form on October 16,2019 at 1:40. I asked him to check BP 2 hours after taking AM medications and bring these readings to appointment

## 2018-03-03 NOTE — Telephone Encounter (Signed)
° °  Pt c/o BP issue: STAT if pt c/o blurred vision, one-sided weakness or slurred speech  1. What are your last 5 BP readings? 188/84, 190/92  2. Are you having any other symptoms (ex. Dizziness, headache, blurred vision, passed out)? Dizziness, headache  3. What is your BP issue? BP higher than normal

## 2018-03-05 ENCOUNTER — Ambulatory Visit: Payer: PPO | Admitting: Cardiovascular Disease

## 2018-03-05 ENCOUNTER — Encounter: Payer: Self-pay | Admitting: Cardiovascular Disease

## 2018-03-05 VITALS — BP 122/80 | HR 61 | Ht 74.0 in | Wt 266.8 lb

## 2018-03-05 DIAGNOSIS — I493 Ventricular premature depolarization: Secondary | ICD-10-CM | POA: Diagnosis not present

## 2018-03-05 DIAGNOSIS — I10 Essential (primary) hypertension: Secondary | ICD-10-CM | POA: Diagnosis not present

## 2018-03-05 DIAGNOSIS — E7800 Pure hypercholesterolemia, unspecified: Secondary | ICD-10-CM | POA: Diagnosis not present

## 2018-03-05 DIAGNOSIS — R011 Cardiac murmur, unspecified: Secondary | ICD-10-CM | POA: Diagnosis not present

## 2018-03-05 DIAGNOSIS — I200 Unstable angina: Secondary | ICD-10-CM | POA: Diagnosis not present

## 2018-03-05 MED ORDER — METOPROLOL SUCCINATE ER 50 MG PO TB24: 50.0000 mg | ORAL_TABLET | Freq: Every day | ORAL | 11 refills | Status: DC

## 2018-03-05 NOTE — Patient Instructions (Signed)
Medication Instructions:  Your physician has recommended you make the following change in your medication: Increase Toprol to 50 mg by mouth daily.   If you need a refill on your cardiac medications before your next appointment, please call your pharmacy.   Lab work: Lab work to be done today--BMP and CBC If you have labs (blood work) drawn today and your tests are completely normal, you will receive your results only by: [name] Kitchen MyChart Message (if you have MyChart) OR . A paper copy in the mail If you have any lab test that is abnormal or we need to change your treatment, we will call you to review the results.  Testing/Procedures: Your physician has requested that you have an echocardiogram. Echocardiography is a painless test that uses sound waves to create images of your heart. It provides your doctor with information about the size and shape of your heart and how well your heart's chambers and valves are working. This procedure takes approximately one hour. There are no restrictions for this procedure.    Your physician has requested that you have a cardiac catheterization. Cardiac catheterization is used to diagnose and/or treat various heart conditions. Doctors may recommend this procedure for a number of different reasons. The most common reason is to evaluate chest pain. Chest pain can be a symptom of coronary artery disease (CAD), and cardiac catheterization can show whether plaque is narrowing or blocking your heart's arteries. This procedure is also used to evaluate the valves, as well as measure the blood flow and oxygen levels in different parts of your heart. For further information please visit [email or web address]. Please follow instruction sheet, as given.  Scheduled for October 22,2019  Follow-Up:  Your physician recommends that you schedule a follow-up appointment in: 4 weeks with PA or NP.           [facility] [place], [place] [address] Dept: [phone] Loc: [place]  03/05/2018  You are scheduled for a Cardiac Catheterization on Tuesday, October 22 with Dr. Peter Martinique.  1. Please arrive at the [facility] (Main Entrance A) at [facility]: [address] [place], [place] [address] at 5:30 AM (This time is two hours before your procedure to ensure your preparation). Free valet parking service is available.   Special note: Every effort is made to have your procedure done on time. Please understand that emergencies sometimes delay scheduled procedures.  2. Diet: Do not eat solid foods after midnight.  The patient may have clear liquids until 5am upon the day of the procedure.  3. Labs: Lab work was done in office on October 16,2019.  4. Medication instructions in preparation for your procedure:   Contrast Allergy: No   Do not take metformin the morning of the procedure and for 48 hours after the procedure.   On the morning of your procedure, take your Aspirin and any morning medicines NOT listed above.  You may use sips of water.  5. Plan for one night stay--bring personal belongings. 6. Bring a current list of your medications and current insurance cards. 7. You MUST have a responsible person to drive you home. 8. Someone MUST be with you the first 24 hours after you arrive home or your discharge will be delayed. 9. Please wear clothes that are easy to get on and off and wear slip-on shoes.  Thank you for allowing [place] to  care for you!   -- [facility]

## 2018-03-05 NOTE — H&P (View-Only) (Signed)
Chief Complaint  Patient presents with  . Follow-up    CAD   History of Present Illness: 72 yo male with history of CAD, HTN, HLD, GERD here today for cardiac follow up.  Last cardiac cath June 2010 with proximal LAD 40%, mid LAD 30%, irregularities in Circumflex and RCA, EF 60%.  He was seen in January 2015 and had c/o heart racing, dizziness, fatigue. Stress myoview 07/27/13 without ischemia. Echo 07/27/13 with normal LV function, no significant valve issues. Event monitor in 2015 without any arrythmias. He called our office in July 2019 with c/o palpitations. Cardiac monitor August 2019 with PVCs, PACs, short run of SVT and 4 beat run of non-sustained VT. Toprol was started.   He is here today for follow up after calling into our office and asking to be seen. He has not been feeling well over the past few weeks. He has had progressive dyspnea with exertion including with mild exertion. No lower extremity edema. His weight has been stable. He has had central chest tightness with moderate exertion. He has had several episodes of dizziness and near syncope. He has felt his heart pounding during these episodes of dizziness. No syncope. He has been taking all of his medications.    Primary Care Physician: Street, Sharon Mt, MD  Past Medical History:  Diagnosis Date  . Anxiety   . Arthritis   . CAD (coronary artery disease)    nonobstructive CAD with 40% narrowing in the proximal LAD   . Cataract    removed bilaterally   . Chest pain   . Diabetes mellitus without complication ([place])   . Dizziness   . GERD (gastroesophageal reflux disease)   . Hyperlipidemia   . Hypertension   . Hypothyroidism   . Lumbar herniated disc     Past Surgical History:  Procedure Laterality Date  . CARDIAC CATHETERIZATION  11/11/2008  . CATARACT EXTRACTION Right   . CATARACT EXTRACTION Left   . COLONOSCOPY    . KNEE ARTHROSCOPY Bilateral 2010 Right  . POLYPECTOMY      Current Outpatient Medications    Medication Sig Dispense Refill  . amLODipine (NORVASC) 5 MG tablet TAKE 1 TABLET BY MOUTH ONCE DAILY 90 tablet 3  . Ascorbic Acid (VITAMIN C) 1000 MG tablet Take 1,000 mg by mouth daily.    [name] Kitchen aspirin EC 81 MG tablet Take 81 mg by mouth daily.    . Calcium Carbonate-Vitamin D (CALTRATE 600+D) 600-400 MG-UNIT per tablet Take 1 tablet by mouth daily.    . Coenzyme Q10 (COQ-10) 200 MG CAPS Take 200 mg by mouth daily.     . diazepam (VALIUM) 2 MG tablet Take 1 tablet (2 mg total) by mouth every 12 (twelve) hours as needed for anxiety. 30 tablet 1  . levothyroxine (SYNTHROID, LEVOTHROID) 50 MCG tablet Take 50 mcg by mouth daily.    . metFORMIN (GLUCOPHAGE) 500 MG tablet Take 500 mg by mouth 2 (two) times daily with a meal.     . metoprolol succinate (TOPROL XL) 25 MG 24 hr tablet Take 1 tablet (25 mg total) by mouth daily. 30 tablet 11  . Multiple Vitamin (MULITIVITAMIN WITH MINERALS) TABS Take 1 tablet by mouth daily.    . Multiple Vitamins-Minerals (PRESERVISION AREDS 2) CAPS Take 2 capsules by mouth daily.    [name] Kitchen NIACIN PO Take 500 mg by mouth daily. Flash Free     . Omega-3 Fatty Acids (FISH OIL) 1200 MG CAPS Take 1,200 mg by mouth  daily.    [name] Kitchen OVER THE COUNTER MEDICATION Take 1 capsule by mouth daily. Move free    . Probiotic Product (PROBIOTIC DAILY PO) Take 1 tablet by mouth daily.    . simvastatin (ZOCOR) 20 MG tablet Take 20 mg by mouth every evening.    . rosuvastatin (CRESTOR) 5 MG tablet Take 5 mg by mouth daily.  0   Current Facility-Administered Medications  Medication Dose Route Frequency Provider Last Rate Last Dose  . 0.9 %  sodium chloride infusion  500 mL Intravenous Continuous Milus Banister, MD        No Known Allergies  Social History   Socioeconomic History  . Marital status: Married    Spouse name: Not on file  . Number of children: Not on file  . Years of education: Not on file  . Highest education level: Not on file  Occupational History  . Not on file  Social  Needs  . Financial resource strain: Not on file  . Food insecurity:    Worry: Not on file    Inability: Not on file  . Transportation needs:    Medical: Not on file    Non-medical: Not on file  Tobacco Use  . Smoking status: Never Smoker  . Smokeless tobacco: Never Used  Substance and Sexual Activity  . Alcohol use: No  . Drug use: No  . Sexual activity: Yes    Comment: married  Lifestyle  . Physical activity:    Days per week: Not on file    Minutes per session: Not on file  . Stress: Not on file  Relationships  . Social connections:    Talks on phone: Not on file    Gets together: Not on file    Attends religious service: Not on file    Active member of club or organization: Not on file    Attends meetings of clubs or organizations: Not on file    Relationship status: Not on file  . Intimate partner violence:    Fear of current or ex partner: Not on file    Emotionally abused: Not on file    Physically abused: Not on file    Forced sexual activity: Not on file  Other Topics Concern  . Not on file  Social History Narrative  . Not on file    Family History  Problem Relation Age of Onset  . Colon cancer Paternal Uncle 10  . Coronary artery disease Father        with CABG valve replacement in his 85's  . Heart attack Mother        in her 4's  . Esophageal cancer Neg Hx   . Rectal cancer Neg Hx   . Stomach cancer Neg Hx   . Colon polyps Neg Hx     Review of Systems:  As stated in the HPI and otherwise negative.   Ht 6\' 2"  (1.88 m)   BMI 35.54 kg/m   Physical Examination:  General: Well developed, well nourished, NAD  HEENT: OP clear, mucus membranes moist  SKIN: warm, dry. No rashes. Neuro: No focal deficits  Musculoskeletal: Muscle strength 5/5 all ext  Psychiatric: Mood and affect normal  Neck: No JVD, no carotid bruits, no thyromegaly, no lymphadenopathy.  Lungs:Clear bilaterally, no wheezes, rhonci, crackles Cardiovascular: Regular rate and  rhythm. No murmurs, gallops or rubs. Abdomen:Soft. Bowel sounds present. Non-tender.  Extremities: No lower extremity edema. Pulses are 2 + in the bilateral DP/PT.  Echo  07/27/13: Left ventricle: The cavity size was normal. Wall thickness was increased in a pattern of mild LVH. Systolic function was normal. The estimated ejection fraction was in the range of 60% to 65%. Wall motion was normal; there were no regional wall motion abnormalities. Doppler parameters are consistent with abnormal left ventricular relaxation (grade 1 diastolic dysfunction). - Aortic valve: Trileaflet; moderately calcified leaflets. - Mitral valve: Mildly calcified annulus. Mildly calcified leaflets . - Left atrium: The atrium was mildly dilated. - Right ventricle: The cavity size was normal. Systolic function was normal. - Tricuspid valve: Peak RV-RA gradient: 41mm Hg (S). - Pulmonary arteries: PA peak pressure: 56mm Hg (S). - Inferior vena cava: The vessel was normal in size; the respirophasic diameter changes were in the normal range (= 50%); findings are consistent with normal central venous pressure. Impressions:  - Normal LV size with mild LV hypertrophy. EF 60-65%. Normal RV size and systolic function. Aortic sclerosis without stenosis.  EKG:  EKG is ordered today. The ekg ordered today demonstrates NSR, rate 61 bpm. Incomplete RBBB  Recent Labs: 01/07/2018: BUN 13; Creatinine, Ser 1.01; Hemoglobin 12.9; Platelets 277; Potassium 4.4; Sodium 140; TSH 2.150   Lipid Panel   Wt Readings from Last 3 Encounters:  08/01/17 276 lb 12.8 oz (125.6 kg)  08/02/16 260 lb 6.4 oz (118.1 kg)  02/21/16 267 lb (121.1 kg)     Other studies Reviewed: Additional studies/ records that were reviewed today include: . Review of the above records demonstrates:    Assessment and Plan:   1. CAD with unstable angina:  He is known to have mild to moderate CAD by cath in June 2010. He had a normal stress in 2015. He  is now having exertional dyspnea and chest pain worrisome for unstable angina. I think a cardiac cath is indicated. Will plan right and left heart cath at [facility] on 1022/19. I will plan the cath with Dr. Martinique as I have only one cath day in the next two weeks and my schedule is already full on that day. I have reviewed the risks, indications, and alternatives to cardiac catheterization, possible angioplasty, and stenting with the patient. Risks include but are not limited to bleeding, infection, vascular injury, stroke, myocardial infection, arrhythmia, kidney injury, radiation-related injury in the case of prolonged fluoroscopy use, emergency cardiac surgery, and death. The patient understands the risks of serious complication is 1-2 in 0175 with diagnostic cardiac cath and 1-2% or less with angioplasty/stenting. -Continue ASA, beta blocker and statin.    -Will arrange echo to assess LV systolic function.   2. HTN: BP is controlled. No changes today  3. HLD:  Lipids followed in primary care. Continue statin.   4. PVCs/PACs/SVT: Continue Toprol. Given recent episodes of palpitations, will increase Toprol to 50 mg po Daily.   5. Systolic murmur: Echo to exclude significant valvular disease.   Current medicines are reviewed at length with the patient today.  The patient does not have concerns regarding medicines.  The following changes have been made:  Added Norvasc.   Labs/ tests ordered today include:   No orders of the defined types were placed in this encounter.   Disposition:   FU with me  Or care team APP 2 weeks after cath.   Signed, Lauree Chandler, MD 03/05/2018 1:51 PM    [facility] [address], [place], [place]  [address] Phone: [phone]; Fax: [phone]

## 2018-03-05 NOTE — Progress Notes (Signed)
Chief Complaint  Patient presents with  . Follow-up    CAD   History of Present Illness: 72 yo male with history of CAD, HTN, HLD, GERD here today for cardiac follow up.  Last cardiac cath June 2010 with proximal LAD 40%, mid LAD 30%, irregularities in Circumflex and RCA, EF 60%.  He was seen in January 2015 and had c/o heart racing, dizziness, fatigue. Stress myoview 07/27/13 without ischemia. Echo 07/27/13 with normal LV function, no significant valve issues. Event monitor in 2015 without any arrythmias. He called our office in July 2019 with c/o palpitations. Cardiac monitor August 2019 with PVCs, PACs, short run of SVT and 4 beat run of non-sustained VT. Toprol was started.   He is here today for follow up after calling into our office and asking to be seen. He has not been feeling well over the past few weeks. He has had progressive dyspnea with exertion including with mild exertion. No lower extremity edema. His weight has been stable. He has had central chest tightness with moderate exertion. He has had several episodes of dizziness and near syncope. He has felt his heart pounding during these episodes of dizziness. No syncope. He has been taking all of his medications.    Primary Care Physician: Street, Sharon Mt, MD  Past Medical History:  Diagnosis Date  . Anxiety   . Arthritis   . CAD (coronary artery disease)    nonobstructive CAD with 40% narrowing in the proximal LAD   . Cataract    removed bilaterally   . Chest pain   . Diabetes mellitus without complication ([place])   . Dizziness   . GERD (gastroesophageal reflux disease)   . Hyperlipidemia   . Hypertension   . Hypothyroidism   . Lumbar herniated disc     Past Surgical History:  Procedure Laterality Date  . CARDIAC CATHETERIZATION  11/11/2008  . CATARACT EXTRACTION Right   . CATARACT EXTRACTION Left   . COLONOSCOPY    . KNEE ARTHROSCOPY Bilateral 2010 Right  . POLYPECTOMY      Current Outpatient Medications    Medication Sig Dispense Refill  . amLODipine (NORVASC) 5 MG tablet TAKE 1 TABLET BY MOUTH ONCE DAILY 90 tablet 3  . Ascorbic Acid (VITAMIN C) 1000 MG tablet Take 1,000 mg by mouth daily.    [name] Kitchen aspirin EC 81 MG tablet Take 81 mg by mouth daily.    . Calcium Carbonate-Vitamin D (CALTRATE 600+D) 600-400 MG-UNIT per tablet Take 1 tablet by mouth daily.    . Coenzyme Q10 (COQ-10) 200 MG CAPS Take 200 mg by mouth daily.     . diazepam (VALIUM) 2 MG tablet Take 1 tablet (2 mg total) by mouth every 12 (twelve) hours as needed for anxiety. 30 tablet 1  . levothyroxine (SYNTHROID, LEVOTHROID) 50 MCG tablet Take 50 mcg by mouth daily.    . metFORMIN (GLUCOPHAGE) 500 MG tablet Take 500 mg by mouth 2 (two) times daily with a meal.     . metoprolol succinate (TOPROL XL) 25 MG 24 hr tablet Take 1 tablet (25 mg total) by mouth daily. 30 tablet 11  . Multiple Vitamin (MULITIVITAMIN WITH MINERALS) TABS Take 1 tablet by mouth daily.    . Multiple Vitamins-Minerals (PRESERVISION AREDS 2) CAPS Take 2 capsules by mouth daily.    [name] Kitchen NIACIN PO Take 500 mg by mouth daily. Flash Free     . Omega-3 Fatty Acids (FISH OIL) 1200 MG CAPS Take 1,200 mg by mouth  daily.    [name] Kitchen OVER THE COUNTER MEDICATION Take 1 capsule by mouth daily. Move free    . Probiotic Product (PROBIOTIC DAILY PO) Take 1 tablet by mouth daily.    . simvastatin (ZOCOR) 20 MG tablet Take 20 mg by mouth every evening.    . rosuvastatin (CRESTOR) 5 MG tablet Take 5 mg by mouth daily.  0   Current Facility-Administered Medications  Medication Dose Route Frequency Provider Last Rate Last Dose  . 0.9 %  sodium chloride infusion  500 mL Intravenous Continuous Milus Banister, MD        No Known Allergies  Social History   Socioeconomic History  . Marital status: Married    Spouse name: Not on file  . Number of children: Not on file  . Years of education: Not on file  . Highest education level: Not on file  Occupational History  . Not on file  Social  Needs  . Financial resource strain: Not on file  . Food insecurity:    Worry: Not on file    Inability: Not on file  . Transportation needs:    Medical: Not on file    Non-medical: Not on file  Tobacco Use  . Smoking status: Never Smoker  . Smokeless tobacco: Never Used  Substance and Sexual Activity  . Alcohol use: No  . Drug use: No  . Sexual activity: Yes    Comment: married  Lifestyle  . Physical activity:    Days per week: Not on file    Minutes per session: Not on file  . Stress: Not on file  Relationships  . Social connections:    Talks on phone: Not on file    Gets together: Not on file    Attends religious service: Not on file    Active member of club or organization: Not on file    Attends meetings of clubs or organizations: Not on file    Relationship status: Not on file  . Intimate partner violence:    Fear of current or ex partner: Not on file    Emotionally abused: Not on file    Physically abused: Not on file    Forced sexual activity: Not on file  Other Topics Concern  . Not on file  Social History Narrative  . Not on file    Family History  Problem Relation Age of Onset  . Colon cancer Paternal Uncle 13  . Coronary artery disease Father        with CABG valve replacement in his 88's  . Heart attack Mother        in her 85's  . Esophageal cancer Neg Hx   . Rectal cancer Neg Hx   . Stomach cancer Neg Hx   . Colon polyps Neg Hx     Review of Systems:  As stated in the HPI and otherwise negative.   Ht 6\' 2"  (1.88 m)   BMI 35.54 kg/m   Physical Examination:  General: Well developed, well nourished, NAD  HEENT: OP clear, mucus membranes moist  SKIN: warm, dry. No rashes. Neuro: No focal deficits  Musculoskeletal: Muscle strength 5/5 all ext  Psychiatric: Mood and affect normal  Neck: No JVD, no carotid bruits, no thyromegaly, no lymphadenopathy.  Lungs:Clear bilaterally, no wheezes, rhonci, crackles Cardiovascular: Regular rate and  rhythm. No murmurs, gallops or rubs. Abdomen:Soft. Bowel sounds present. Non-tender.  Extremities: No lower extremity edema. Pulses are 2 + in the bilateral DP/PT.  Echo  07/27/13: Left ventricle: The cavity size was normal. Wall thickness was increased in a pattern of mild LVH. Systolic function was normal. The estimated ejection fraction was in the range of 60% to 65%. Wall motion was normal; there were no regional wall motion abnormalities. Doppler parameters are consistent with abnormal left ventricular relaxation (grade 1 diastolic dysfunction). - Aortic valve: Trileaflet; moderately calcified leaflets. - Mitral valve: Mildly calcified annulus. Mildly calcified leaflets . - Left atrium: The atrium was mildly dilated. - Right ventricle: The cavity size was normal. Systolic function was normal. - Tricuspid valve: Peak RV-RA gradient: 37mm Hg (S). - Pulmonary arteries: PA peak pressure: 59mm Hg (S). - Inferior vena cava: The vessel was normal in size; the respirophasic diameter changes were in the normal range (= 50%); findings are consistent with normal central venous pressure. Impressions:  - Normal LV size with mild LV hypertrophy. EF 60-65%. Normal RV size and systolic function. Aortic sclerosis without stenosis.  EKG:  EKG is ordered today. The ekg ordered today demonstrates NSR, rate 61 bpm. Incomplete RBBB  Recent Labs: 01/07/2018: BUN 13; Creatinine, Ser 1.01; Hemoglobin 12.9; Platelets 277; Potassium 4.4; Sodium 140; TSH 2.150   Lipid Panel   Wt Readings from Last 3 Encounters:  08/01/17 276 lb 12.8 oz (125.6 kg)  08/02/16 260 lb 6.4 oz (118.1 kg)  02/21/16 267 lb (121.1 kg)     Other studies Reviewed: Additional studies/ records that were reviewed today include: . Review of the above records demonstrates:    Assessment and Plan:   1. CAD with unstable angina:  He is known to have mild to moderate CAD by cath in June 2010. He had a normal stress in 2015. He  is now having exertional dyspnea and chest pain worrisome for unstable angina. I think a cardiac cath is indicated. Will plan right and left heart cath at [facility] on 1022/19. I will plan the cath with Dr. Martinique as I have only one cath day in the next two weeks and my schedule is already full on that day. I have reviewed the risks, indications, and alternatives to cardiac catheterization, possible angioplasty, and stenting with the patient. Risks include but are not limited to bleeding, infection, vascular injury, stroke, myocardial infection, arrhythmia, kidney injury, radiation-related injury in the case of prolonged fluoroscopy use, emergency cardiac surgery, and death. The patient understands the risks of serious complication is 1-2 in 3662 with diagnostic cardiac cath and 1-2% or less with angioplasty/stenting. -Continue ASA, beta blocker and statin.    -Will arrange echo to assess LV systolic function.   2. HTN: BP is controlled. No changes today  3. HLD:  Lipids followed in primary care. Continue statin.   4. PVCs/PACs/SVT: Continue Toprol. Given recent episodes of palpitations, will increase Toprol to 50 mg po Daily.   5. Systolic murmur: Echo to exclude significant valvular disease.   Current medicines are reviewed at length with the patient today.  The patient does not have concerns regarding medicines.  The following changes have been made:  Added Norvasc.   Labs/ tests ordered today include:   No orders of the defined types were placed in this encounter.   Disposition:   FU with me  Or care team APP 2 weeks after cath.   Signed, Lauree Chandler, MD 03/05/2018 1:51 PM    [facility] [address], [place], [place]  [address] Phone: [phone]; Fax: [phone]

## 2018-03-06 LAB — CBC
Hematocrit: 42.3 % (ref 37.5–51.0)
Hemoglobin: 13.8 g/dL (ref 13.0–17.7)
MCH: 28.9 pg (ref 26.6–33.0)
MCHC: 32.6 g/dL (ref 31.5–35.7)
MCV: 89 fL (ref 79–97)
Platelets: 309 10*3/uL (ref 150–450)
RBC: 4.78 x10E6/uL (ref 4.14–5.80)
RDW: 14.9 % (ref 12.3–15.4)
WBC: 7.8 10*3/uL (ref 3.4–10.8)

## 2018-03-06 LAB — BASIC METABOLIC PANEL
BUN / CREAT RATIO: 13 (ref 10–24)
BUN: 13 mg/dL (ref 8–27)
CO2: 23 mmol/L (ref 20–29)
CREATININE: 1.03 mg/dL (ref 0.76–1.27)
Calcium: 10 mg/dL (ref 8.6–10.2)
Chloride: 101 mmol/L (ref 96–106)
GFR, EST AFRICAN AMERICAN: 83 mL/min/{1.73_m2} (ref >59)
GFR, EST NON AFRICAN AMERICAN: 72 mL/min/{1.73_m2} (ref >59)
GLUCOSE: 99 mg/dL (ref 65–99)
Potassium: 4.5 mmol/L (ref 3.5–5.2)
SODIUM: 139 mmol/L (ref 134–144)

## 2018-03-06 NOTE — Addendum Note (Signed)
Addended by: Jones Broom on: 03/06/2018 02:50 PM   Modules accepted: Orders

## 2018-03-06 NOTE — Addendum Note (Signed)
Addended by: Mendel Ryder on: 03/06/2018 09:48 AM   Modules accepted: Orders

## 2018-03-07 ENCOUNTER — Other Ambulatory Visit: Payer: Self-pay

## 2018-03-07 ENCOUNTER — Ambulatory Visit (HOSPITAL_COMMUNITY): Payer: PPO | Attending: Cardiology

## 2018-03-07 DIAGNOSIS — I200 Unstable angina: Secondary | ICD-10-CM | POA: Diagnosis not present

## 2018-03-07 NOTE — Addendum Note (Signed)
Addended by: Jones Broom on: 03/07/2018 08:17 AM   Modules accepted: Orders

## 2018-03-10 ENCOUNTER — Telehealth: Payer: Self-pay | Admitting: *Deleted

## 2018-03-10 NOTE — Telephone Encounter (Signed)
Pt contacted pre-catheterization scheduled at [facility] for: Tuesday March 11, 2018 7:30 AM Verified arrival time and place: [place] Entrance A at: 5:30 AM  No solid food after midnight prior to cath, clear liquids until 5 AM day of procedure.   Hold: Metformin-day of procedure and 48 hours post procedure.   Except hold medications AM meds can be  taken pre-cath with sip of water including: ASA 81 mg  Confirmed patient has responsible person to drive home post procedure and for 24 hours after you arrive home: yes

## 2018-03-11 ENCOUNTER — Ambulatory Visit (HOSPITAL_COMMUNITY)
Admission: RE | Admit: 2018-03-11 | Discharge: 2018-03-11 | Disposition: A | Discharge status: Home / Self Care | Payer: PPO | Source: Ambulatory Visit | Attending: Cardiology | Admitting: Cardiology

## 2018-03-11 ENCOUNTER — Encounter (HOSPITAL_COMMUNITY)
Admission: RE | Disposition: A | Discharge status: Home / Self Care | Payer: Self-pay | Source: Ambulatory Visit | Attending: Cardiology

## 2018-03-11 ENCOUNTER — Other Ambulatory Visit: Payer: Self-pay

## 2018-03-11 DIAGNOSIS — Z7984 Long term (current) use of oral hypoglycemic drugs: Secondary | ICD-10-CM | POA: Diagnosis not present

## 2018-03-11 DIAGNOSIS — E039 Hypothyroidism, unspecified: Secondary | ICD-10-CM | POA: Insufficient documentation

## 2018-03-11 DIAGNOSIS — I471 Supraventricular tachycardia: Secondary | ICD-10-CM | POA: Diagnosis not present

## 2018-03-11 DIAGNOSIS — I700 Atherosclerosis of aorta: Secondary | ICD-10-CM | POA: Diagnosis not present

## 2018-03-11 DIAGNOSIS — I2584 Coronary atherosclerosis due to calcified coronary lesion: Secondary | ICD-10-CM | POA: Insufficient documentation

## 2018-03-11 DIAGNOSIS — Z9841 Cataract extraction status, right eye: Secondary | ICD-10-CM | POA: Diagnosis not present

## 2018-03-11 DIAGNOSIS — Z79890 Hormone replacement therapy (postmenopausal): Secondary | ICD-10-CM | POA: Insufficient documentation

## 2018-03-11 DIAGNOSIS — I2510 Atherosclerotic heart disease of native coronary artery without angina pectoris: Secondary | ICD-10-CM | POA: Diagnosis not present

## 2018-03-11 DIAGNOSIS — Z79899 Other long term (current) drug therapy: Secondary | ICD-10-CM | POA: Insufficient documentation

## 2018-03-11 DIAGNOSIS — Z9889 Other specified postprocedural states: Secondary | ICD-10-CM | POA: Insufficient documentation

## 2018-03-11 DIAGNOSIS — I200 Unstable angina: Secondary | ICD-10-CM

## 2018-03-11 DIAGNOSIS — I493 Ventricular premature depolarization: Secondary | ICD-10-CM | POA: Insufficient documentation

## 2018-03-11 DIAGNOSIS — E785 Hyperlipidemia, unspecified: Secondary | ICD-10-CM | POA: Insufficient documentation

## 2018-03-11 DIAGNOSIS — H269 Unspecified cataract: Secondary | ICD-10-CM | POA: Insufficient documentation

## 2018-03-11 DIAGNOSIS — R011 Cardiac murmur, unspecified: Secondary | ICD-10-CM | POA: Insufficient documentation

## 2018-03-11 DIAGNOSIS — K219 Gastro-esophageal reflux disease without esophagitis: Secondary | ICD-10-CM | POA: Diagnosis not present

## 2018-03-11 DIAGNOSIS — M1990 Unspecified osteoarthritis, unspecified site: Secondary | ICD-10-CM | POA: Diagnosis not present

## 2018-03-11 DIAGNOSIS — Z955 Presence of coronary angioplasty implant and graft: Secondary | ICD-10-CM | POA: Insufficient documentation

## 2018-03-11 DIAGNOSIS — E119 Type 2 diabetes mellitus without complications: Secondary | ICD-10-CM | POA: Diagnosis not present

## 2018-03-11 DIAGNOSIS — Z8249 Family history of ischemic heart disease and other diseases of the circulatory system: Secondary | ICD-10-CM | POA: Diagnosis not present

## 2018-03-11 DIAGNOSIS — R42 Dizziness and giddiness: Secondary | ICD-10-CM | POA: Diagnosis not present

## 2018-03-11 DIAGNOSIS — R072 Precordial pain: Secondary | ICD-10-CM | POA: Diagnosis present

## 2018-03-11 DIAGNOSIS — I10 Essential (primary) hypertension: Secondary | ICD-10-CM | POA: Diagnosis present

## 2018-03-11 DIAGNOSIS — I25119 Atherosclerotic heart disease of native coronary artery with unspecified angina pectoris: Secondary | ICD-10-CM | POA: Insufficient documentation

## 2018-03-11 DIAGNOSIS — F419 Anxiety disorder, unspecified: Secondary | ICD-10-CM | POA: Insufficient documentation

## 2018-03-11 DIAGNOSIS — Z7982 Long term (current) use of aspirin: Secondary | ICD-10-CM | POA: Diagnosis not present

## 2018-03-11 DIAGNOSIS — Z9842 Cataract extraction status, left eye: Secondary | ICD-10-CM | POA: Insufficient documentation

## 2018-03-11 DIAGNOSIS — E7800 Pure hypercholesterolemia, unspecified: Secondary | ICD-10-CM | POA: Diagnosis present

## 2018-03-11 HISTORY — PX: CORONARY STENT INTERVENTION: CATH118234

## 2018-03-11 HISTORY — PX: RIGHT/LEFT HEART CATH AND CORONARY ANGIOGRAPHY: CATH118266

## 2018-03-11 LAB — POCT I-STAT 3, VENOUS BLOOD GAS (G3P V)
ACID-BASE DEFICIT: 2 mmol/L (ref 0.0–2.0)
BICARBONATE: 23.1 mmol/L (ref 20.0–28.0)
O2 Saturation: 74 %
TCO2: 24 mmol/L (ref 22–32)
pCO2, Ven: 38 mmHg — ABNORMAL LOW (ref 44.0–60.0)
pH, Ven: 7.391 (ref 7.250–7.430)
pO2, Ven: 39 mmHg (ref 32.0–45.0)

## 2018-03-11 LAB — POCT ACTIVATED CLOTTING TIME: Activated Clotting Time: 263 seconds

## 2018-03-11 LAB — POCT I-STAT 3, ART BLOOD GAS (G3+)
ACID-BASE DEFICIT: 1 mmol/L (ref 0.0–2.0)
BICARBONATE: 23.6 mmol/L (ref 20.0–28.0)
O2 SAT: 97 %
PCO2 ART: 37.8 mmHg (ref 32.0–48.0)
PO2 ART: 88 mmHg (ref 83.0–108.0)
TCO2: 25 mmol/L (ref 22–32)
pH, Arterial: 7.404 (ref 7.350–7.450)

## 2018-03-11 LAB — GLUCOSE, CAPILLARY: Glucose-Capillary: 148 mg/dL — ABNORMAL HIGH (ref 70–99)

## 2018-03-11 SURGERY — RIGHT/LEFT HEART CATH AND CORONARY ANGIOGRAPHY
Anesthesia: LOCAL

## 2018-03-11 MED ORDER — LIDOCAINE HCL (PF) 1 % IJ SOLN
INTRAMUSCULAR | Status: DC | PRN
  Administered 2018-03-11 (×2): 2 mL

## 2018-03-11 MED ORDER — SODIUM CHLORIDE 0.9 % IV SOLN: 250.0000 mL | INTRAVENOUS | Status: DC | PRN

## 2018-03-11 MED ORDER — NITROGLYCERIN 0.4 MG SL SUBL: 0.4000 mg | SUBLINGUAL_TABLET | SUBLINGUAL | 2 refills | Status: DC | PRN

## 2018-03-11 MED ORDER — SODIUM CHLORIDE 0.9% FLUSH: 3.0000 mL | Freq: Two times a day (BID) | INTRAVENOUS | Status: DC

## 2018-03-11 MED ORDER — FENTANYL CITRATE (PF) 100 MCG/2ML IJ SOLN
INTRAMUSCULAR | Status: DC | PRN
  Administered 2018-03-11 (×2): 25 ug via INTRAVENOUS

## 2018-03-11 MED ORDER — SODIUM CHLORIDE 0.9 % WEIGHT BASED INFUSION: 1.0000 mL/kg/h | INTRAVENOUS | Status: DC

## 2018-03-11 MED ORDER — LIDOCAINE HCL (PF) 1 % IJ SOLN
INTRAMUSCULAR | Status: AC
  Filled 2018-03-11: qty 30

## 2018-03-11 MED ORDER — CLOPIDOGREL BISULFATE 75 MG PO TABS: 75.0000 mg | ORAL_TABLET | Freq: Every day | ORAL | 0 refills | Status: DC

## 2018-03-11 MED ORDER — MIDAZOLAM HCL 2 MG/2ML IJ SOLN
INTRAMUSCULAR | Status: AC
  Filled 2018-03-11: qty 2

## 2018-03-11 MED ORDER — SODIUM CHLORIDE 0.9 % IV SOLN
INTRAVENOUS | Status: AC
  Administered 2018-03-11: 06:00:00 via INTRAVENOUS

## 2018-03-11 MED ORDER — FENTANYL CITRATE (PF) 100 MCG/2ML IJ SOLN
INTRAMUSCULAR | Status: AC
  Filled 2018-03-11: qty 2

## 2018-03-11 MED ORDER — HEPARIN SODIUM (PORCINE) 1000 UNIT/ML IJ SOLN
INTRAMUSCULAR | Status: DC | PRN
  Administered 2018-03-11: 3000 [IU] via INTRAVENOUS
  Administered 2018-03-11 (×2): 6000 [IU] via INTRAVENOUS

## 2018-03-11 MED ORDER — ASPIRIN 81 MG PO CHEW: 81.0000 mg | CHEWABLE_TABLET | ORAL | Status: DC

## 2018-03-11 MED ORDER — ONDANSETRON HCL 4 MG/2ML IJ SOLN: 4.0000 mg | Freq: Four times a day (QID) | INTRAMUSCULAR | Status: DC | PRN

## 2018-03-11 MED ORDER — CLOPIDOGREL BISULFATE 300 MG PO TABS
ORAL_TABLET | ORAL | Status: DC | PRN
  Administered 2018-03-11: 600 mg via ORAL

## 2018-03-11 MED ORDER — HEPARIN (PORCINE) IN NACL 1000-0.9 UT/500ML-% IV SOLN
INTRAVENOUS | Status: AC
  Filled 2018-03-11: qty 1000

## 2018-03-11 MED ORDER — CLOPIDOGREL BISULFATE 75 MG PO TABS: 75.0000 mg | ORAL_TABLET | Freq: Every day | ORAL | Status: DC

## 2018-03-11 MED ORDER — CLOPIDOGREL BISULFATE 300 MG PO TABS
ORAL_TABLET | ORAL | Status: AC
  Filled 2018-03-11: qty 2

## 2018-03-11 MED ORDER — SODIUM CHLORIDE 0.9% FLUSH: 3.0000 mL | INTRAVENOUS | Status: DC | PRN

## 2018-03-11 MED ORDER — VERAPAMIL HCL 2.5 MG/ML IV SOLN
INTRAVENOUS | Status: AC
  Filled 2018-03-11: qty 2

## 2018-03-11 MED ORDER — VERAPAMIL HCL 2.5 MG/ML IV SOLN
INTRAVENOUS | Status: DC | PRN
  Administered 2018-03-11: 08:00:00 via INTRA_ARTERIAL

## 2018-03-11 MED ORDER — ACETAMINOPHEN 325 MG PO TABS: 650.0000 mg | ORAL_TABLET | ORAL | Status: DC | PRN

## 2018-03-11 MED ORDER — IOHEXOL 350 MG/ML SOLN
INTRAVENOUS | Status: DC | PRN
  Administered 2018-03-11: 160 mL via INTRACARDIAC

## 2018-03-11 MED ORDER — HEPARIN (PORCINE) IN NACL 1000-0.9 UT/500ML-% IV SOLN
INTRAVENOUS | Status: DC | PRN
  Administered 2018-03-11 (×2): 500 mL

## 2018-03-11 MED ORDER — MIDAZOLAM HCL 2 MG/2ML IJ SOLN
INTRAMUSCULAR | Status: DC | PRN
  Administered 2018-03-11 (×2): 1 mg via INTRAVENOUS

## 2018-03-11 MED ORDER — HEPARIN SODIUM (PORCINE) 1000 UNIT/ML IJ SOLN
INTRAMUSCULAR | Status: AC
  Filled 2018-03-11: qty 1

## 2018-03-11 MED FILL — CLOPIDOGREL 75 MG TABLET: 75 | 30 days supply | Qty: 30 | Fill #0

## 2018-03-11 SURGICAL SUPPLY — 22 items
BALLN SAPPHIRE 2.0X12 (BALLOONS) ×2
BALLN SAPPHIRE ~~LOC~~ 2.25X12 (BALLOONS) ×2 IMPLANT
BALLN SAPPHIRE ~~LOC~~ 2.5X8 (BALLOONS) ×2 IMPLANT
BALLOON SAPPHIRE 2.0X12 (BALLOONS) ×1 IMPLANT
CATH 5FR JL3.5 JR4 ANG PIG MP (CATHETERS) ×2 IMPLANT
CATH BALLN WEDGE 5F 110CM (CATHETERS) ×2 IMPLANT
CATH LAUNCHER 5F RADR (CATHETERS) ×1 IMPLANT
CATH LAUNCHER 6FR JR4 (CATHETERS) ×2 IMPLANT
CATHETER LAUNCHER 5F RADR (CATHETERS) ×2
DEVICE RAD COMP TR BAND LRG (VASCULAR PRODUCTS) ×2 IMPLANT
GLIDESHEATH SLEND SS 6F .021 (SHEATH) ×2 IMPLANT
GUIDEWIRE INQWIRE 1.5J.035X260 (WIRE) ×1 IMPLANT
INQWIRE 1.5J .035X260CM (WIRE) ×2
KIT ENCORE 26 ADVANTAGE (KITS) ×2 IMPLANT
KIT HEART LEFT (KITS) ×2 IMPLANT
PACK CARDIAC CATHETERIZATION (CUSTOM PROCEDURE TRAY) ×2 IMPLANT
SHEATH GLIDE SLENDER 4/5FR (SHEATH) ×2 IMPLANT
STENT SIERRA 2.25 X 12 MM (Permanent Stent) ×2 IMPLANT
TRANSDUCER W/STOPCOCK (MISCELLANEOUS) ×2 IMPLANT
TUBING CIL FLEX 10 FLL-RA (TUBING) ×2 IMPLANT
WIRE ASAHI PROWATER 180CM (WIRE) ×2 IMPLANT
WIRE EMERALD 3MM-J .025X260CM (WIRE) ×2 IMPLANT

## 2018-03-11 NOTE — Discharge Summary (Signed)
Discharge Summary    Patient ID: Peter Brooks,  MRN: [id number], DOB/AGE: June 07, 1945 72 y.o.  Admit date: 03/11/2018 Discharge date: 03/11/2018  Primary Care Provider: Emmaline Kluver Primary Cardiologist: Dr. Angelena Form   Discharge Diagnoses    Principal Problem:   CHEST PAIN-PRECORDIAL Active Problems:   HYPERCHOLESTEROLEMIA   HYPERTENSION, BENIGN   DIZZINESS   Allergies No Known Allergies  Diagnostic Studies/Procedures    Cath: 03/11/18   Ost RPDA to RPDA lesion is 95% stenosed.  A drug-eluting stent was successfully placed using a STENT SIERRA 2.25 X 12 MM.  Post intervention, there is a 0% residual stenosis.  Prox LAD to Mid LAD lesion is 40% stenosed.  Ost 2nd Diag to 2nd Diag lesion is 60% stenosed.  Prox Cx lesion is 40% stenosed.  LV end diastolic pressure is normal.   1. Single vessel obstructive CAD involving the PDA. Moderate calcific disease in the LAD and LCx. 2. Normal LV filling pressures 3. Normal right heart pressures and cardiac output 4. Successful PCI of the PDA with DES.  Plan: patient is a candidate for same day discharge. Hold metformin for 48 hours.  Recommend uninterrupted dual antiplatelet therapy with Aspirin 81mg  daily and Clopidogrel 75mg  daily for a minimum of 6 months (stable ischemic heart disease - Class I recommendation). _____________   History of Present Illness     72 yo male with history of CAD, HTN, HLD, GERD who presented to the office for cardiac follow up. Last cardiac cath June 2010 with proximal LAD 40%, mid LAD 30%, irregularities in Circumflex and RCA, EF 60%.  He was seen in January 2015 and had c/o heart racing, dizziness, fatigue. Stress myoview 07/27/13 without ischemia. Echo 07/27/13 with normal LV function, no significant valve issues. Event monitor in 2015 without any arrythmias. He called our office in July 2019 with c/o palpitations. Cardiac monitor August 2019 with PVCs, PACs, short run of SVT  and 4 beat run of non-sustained VT. Toprol was started.   He presented to the office for follow up after calling into our office and asking to be seen. He had not been feeling well over the past few weeks. He had had progressive dyspnea with exertion including with mild exertion. No lower extremity edema. His weight had been stable. He had central chest tightness with moderate exertion. He had several episodes of dizziness and near syncope. He had felt his heart pounding during these episodes of dizziness. No syncope. He has been taking all of his medications. Given his exertional dyspnea and unstable angina he was set up for outpatient cardiac cath.   Hospital Course     Underwent cardiac cath noted above with single vessel CAD involving the PDA with PCI/DES x1 placed. Moderate disease in the LAD and Lcx with normal filling pressures and normal right heart pressures. Plan for DAPT with ASA/plavix for at least 6 months. Seen by cardiac rehab and provided education prior to discharge. Instructions/precautions given regarding site care.  Peter Brooks was seen by Dr. Martinique and determined stable for discharge home. Follow up in the office has been arranged. Medications are listed below.   _____________  Discharge Vitals Blood pressure 128/71, pulse 61, temperature 97.9 F (36.6 C), temperature source Oral, resp. rate 15, height 6\' 2"  (1.88 m), weight 118.4 kg, SpO2 97 %.  Filed Weights   03/11/18 0534  Weight: 118.4 kg    Labs & Radiologic Studies    CBC No results for input(s):  WBC, NEUTROABS, HGB, HCT, MCV, PLT in the last 72 hours. Basic Metabolic Panel No results for input(s): NA, K, CL, CO2, GLUCOSE, BUN, CREATININE, CALCIUM, MG, PHOS in the last 72 hours. Liver Function Tests No results for input(s): AST, ALT, ALKPHOS, BILITOT, PROT, ALBUMIN in the last 72 hours. No results for input(s): LIPASE, AMYLASE in the last 72 hours. Cardiac Enzymes No results for input(s): CKTOTAL,  CKMB, CKMBINDEX, TROPONINI in the last 72 hours. BNP Invalid input(s): POCBNP D-Dimer No results for input(s): DDIMER in the last 72 hours. Hemoglobin A1C No results for input(s): HGBA1C in the last 72 hours. Fasting Lipid Panel No results for input(s): CHOL, HDL, LDLCALC, TRIG, CHOLHDL, LDLDIRECT in the last 72 hours. Thyroid Function Tests No results for input(s): TSH, T4TOTAL, T3FREE, THYROIDAB in the last 72 hours.  Invalid input(s): FREET3 _____________  No results found. Disposition   Pt is being discharged home today in good condition.  Follow-up Plans & Appointments    Follow-up Information    Imogene Burn, PA-C Follow up on 03/19/2018.   Specialty:  Cardiology Why:  at 11am for your follow up appt.  Contact information: [address] [address] [phone]          Discharge Instructions    Amb Referral to Cardiac Rehabilitation   Complete by:  As directed    Diagnosis:   Coronary Stents PTCA        Discharge Medications     Medication List    STOP taking these medications   ibuprofen 200 MG tablet Commonly known as:  ADVIL,MOTRIN     TAKE these medications   amLODipine 5 MG tablet Commonly known as:  NORVASC TAKE 1 TABLET BY MOUTH ONCE DAILY   aspirin EC 81 MG tablet Take 81 mg by mouth every evening.   CALTRATE 600+D 600-400 MG-UNIT tablet Generic drug:  Calcium Carbonate-Vitamin D Take 1 tablet by mouth daily.   clopidogrel 75 MG tablet Commonly known as:  PLAVIX Take 1 tablet (75 mg total) by mouth daily.   CoQ-10 400 MG Caps Take 400 mg by mouth daily.   diazepam 2 MG tablet Commonly known as:  VALIUM Take 1 tablet (2 mg total) by mouth every 12 (twelve) hours as needed for anxiety.   docusate sodium 100 MG capsule Commonly known as:  COLACE Take 100 mg by mouth daily as needed for mild constipation.   levothyroxine 50 MCG tablet Commonly known as:  SYNTHROID, LEVOTHROID Take 50 mcg by  mouth daily.   metFORMIN 500 MG 24 hr tablet Commonly known as:  GLUCOPHAGE-XR Take 500 mg by mouth 2 (two) times daily.   metoprolol succinate 50 MG 24 hr tablet Commonly known as:  TOPROL-XL Take 1 tablet (50 mg total) by mouth daily.   MOVE FREE PO Take 1 tablet by mouth 2 (two) times daily.   multivitamin with minerals Tabs tablet Take 1 tablet by mouth daily.   niacin 500 MG tablet Take 500 mg by mouth daily.   nitroGLYCERIN 0.4 MG SL tablet Commonly known as:  NITROSTAT Place 1 tablet (0.4 mg total) under the tongue every 5 (five) minutes as needed.   Omega-3-6-9 Caps Take 1 capsule by mouth daily.   PRESERVISION AREDS 2 Caps Take 1 capsule by mouth 2 (two) times daily.   PROBIOTIC DAILY PO Take 1 tablet by mouth daily.   rosuvastatin 5 MG tablet Commonly known as:  CRESTOR Take 5 mg by mouth every evening.  vitamin C 1000 MG tablet Take 1,000 mg by mouth daily.        Acute coronary syndrome (MI, NSTEMI, STEMI, etc) this admission?: No.     Outstanding Labs/Studies   N/a   Duration of Discharge Encounter   Greater than 30 minutes including physician time.  Signed, Reino Bellis NP-C 03/11/2018, 2:19 PM

## 2018-03-11 NOTE — Discharge Instructions (Signed)
Drink plenty of water Keep right arm at or above heart level. Radial Site Care Refer to this sheet in the next few weeks. These instructions provide you with information about caring for yourself after your procedure. Your health care provider may also give you more specific instructions. Your treatment has been planned according to current medical practices, but problems sometimes occur. Call your health care provider if you have any problems or questions after your procedure. What can I expect after the procedure? After your procedure, it is typical to have the following:  Bruising at the radial site that usually fades within 1-2 weeks.  Blood collecting in the tissue (hematoma) that may be painful to the touch. It should usually decrease in size and tenderness within 1-2 weeks.  Follow these instructions at home:  Take medicines only as directed by your health care provider.  You may shower 24-48 hours after the procedure or as directed by your health care provider. Remove the bandage (dressing) and gently wash the site with plain soap and water. Pat the area dry with a clean towel. Do not rub the site, because this may cause bleeding.  Do not take baths, swim, or use a hot tub until your health care provider approves.  Check your insertion site every day for redness, swelling, or drainage.  Do not apply powder or lotion to the site.  Do not flex or bend the affected arm for 24 hours or as directed by your health care provider.  Do not push or pull heavy objects with the affected arm for 24 hours or as directed by your health care provider.  Do not lift over 10 lb (4.5 kg) for 5 days after your procedure or as directed by your health care provider.  Ask your health care provider when it is okay to: ? Return to work or school. ? Resume usual physical activities or sports. ? Resume sexual activity.  Do not drive home if you are discharged the same day as the procedure. Have  someone else drive you.  You may drive 24 hours after the procedure unless otherwise instructed by your health care provider.  Do not operate machinery or power tools for 24 hours after the procedure.  If your procedure was done as an outpatient procedure, which means that you went home the same day as your procedure, a responsible adult should be with you for the first 24 hours after you arrive home.  Keep all follow-up visits as directed by your health care provider. This is important. Contact a health care provider if:  You have a fever.  You have chills.  You have increased bleeding from the radial site. Hold pressure on the site. Get help right away if:  You have unusual pain at the radial site.  You have redness, warmth, or swelling at the radial site.  You have drainage (other than a small amount of blood on the dressing) from the radial site.  The radial site is bleeding, and the bleeding does not stop after 30 minutes of holding steady pressure on the site.  Your arm or hand becomes pale, cool, tingly, or numb. This information is not intended to replace advice given to you by your health care provider. Make sure you discuss any questions you have with your health care provider. Document Released: 06/09/2010 Document Revised: 10/13/2015 Document Reviewed: 11/23/2013 Elsevier Interactive Patient Education  2018 [employer].

## 2018-03-11 NOTE — Progress Notes (Signed)
CARDIAC REHAB PHASE I   Ed completed with pt and family. Good reception. Will refer to [place]. He understands the importance of Plavix/ASA. [place], ACSM 03/11/2018 1:38 PM

## 2018-03-11 NOTE — Interval H&P Note (Signed)
History and Physical Interval Note:  03/11/2018 7:18 AM  Peter Brooks  has presented today for surgery, with the diagnosis of ua  The various methods of treatment have been discussed with the patient and family. After consideration of risks, benefits and other options for treatment, the patient has consented to  Procedure(s): RIGHT/LEFT HEART CATH AND CORONARY ANGIOGRAPHY (N/A) as a surgical intervention .  The patient's history has been reviewed, patient examined, no change in status, stable for surgery.  I have reviewed the patient's chart and labs.  Questions were answered to the patient's satisfaction.   Cath Lab Visit (complete for each Cath Lab visit)  Clinical Evaluation Leading to the Procedure:   ACS: No.  Non-ACS:    Anginal Classification: CCS II  Anti-ischemic medical therapy: Maximal Therapy (2 or more classes of medications)  Non-Invasive Test Results: No non-invasive testing performed  Prior CABG: No previous CABG        Peter Brooks [facility] 03/11/2018 7:19 AM

## 2018-03-12 ENCOUNTER — Encounter (HOSPITAL_COMMUNITY): Payer: Self-pay | Admitting: Cardiology

## 2018-03-18 DIAGNOSIS — I4729 Other ventricular tachycardia: Secondary | ICD-10-CM | POA: Insufficient documentation

## 2018-03-18 DIAGNOSIS — I472 Ventricular tachycardia: Secondary | ICD-10-CM | POA: Insufficient documentation

## 2018-03-18 NOTE — Progress Notes (Signed)
Cardiology Office Note    Date:  03/19/2018   ID:  Peter Brooks, DOB September 02, 1945, MRN [id number]  PCP:  Street, Sharon Mt, MD  Cardiologist: Lauree Chandler, MD EPS: None  No chief complaint on file.   History of Present Illness:  Peter Brooks is a 72 y.o. male with history of CAD last cath 10/2008 40% LAD, 30% mid LAD, irregular circumflex and RCA EF 60%.  Stress Myoview 2015 without ischemia, event monitor 2015 without arrhythmias cardiac monitor 12-2017 PVCs, PACs, short run of SVT and 4 beat run of V. tach started on Toprol.  Patient had worsening dyspnea on exertion chest pain and underwent cardiac catheterization 03/11/2018 found to have a tight PDA treated with DES with residual moderate disease in the LAD and circumflex.  Plan for DAPT and aspirin/Plavix for at least 6 months.  Patient comes in accompanied by his wife. He still has some shortness of breath with exertion but no jaw pain.  He thought he did feel like a new person after the stent but he does not.  He also complains of vertigo since cath but not as bad as last year. Has had a couple fast HR since stent but not lasting as long.  Starts cardiac rehab next week.  Past Medical History:  Diagnosis Date  . Anxiety   . Arthritis   . CAD (coronary artery disease)    nonobstructive CAD with 40% narrowing in the proximal LAD   . Cataract    removed bilaterally   . Chest pain   . Diabetes mellitus without complication ([place])   . Dizziness   . GERD (gastroesophageal reflux disease)   . Hyperlipidemia   . Hypertension   . Hypothyroidism   . Lumbar herniated disc     Past Surgical History:  Procedure Laterality Date  . CARDIAC CATHETERIZATION  11/11/2008  . CATARACT EXTRACTION Right   . CATARACT EXTRACTION Left   . COLONOSCOPY    . CORONARY STENT INTERVENTION N/A 03/11/2018   Procedure: CORONARY STENT INTERVENTION;  Surgeon: Martinique, Peter M, MD;  Location: [facility];  Service:  Cardiovascular;  Laterality: N/A;  . KNEE ARTHROSCOPY Bilateral 2010 Right  . POLYPECTOMY    . RIGHT/LEFT HEART CATH AND CORONARY ANGIOGRAPHY N/A 03/11/2018   Procedure: RIGHT/LEFT HEART CATH AND CORONARY ANGIOGRAPHY;  Surgeon: Martinique, Peter M, MD;  Location: [facility];  Service: Cardiovascular;  Laterality: N/A;    Current Medications: Current Meds  Medication Sig  . amLODipine (NORVASC) 5 MG tablet TAKE 1 TABLET BY MOUTH ONCE DAILY  . Ascorbic Acid (VITAMIN C) 1000 MG tablet Take 1,000 mg by mouth daily.  [name] Kitchen aspirin EC 81 MG tablet Take 81 mg by mouth every evening.   . Calcium Carbonate-Vitamin D (CALTRATE 600+D) 600-400 MG-UNIT per tablet Take 1 tablet by mouth daily.  . clopidogrel (PLAVIX) 75 MG tablet Take 1 tablet (75 mg total) by mouth daily.  . Coenzyme Q10 (COQ-10) 400 MG CAPS Take 400 mg by mouth daily.  . diazepam (VALIUM) 2 MG tablet Take 1 tablet (2 mg total) by mouth every 12 (twelve) hours as needed for anxiety.  . docusate sodium (COLACE) 100 MG capsule Take 100 mg by mouth daily as needed for mild constipation.  . Glucosamine-Chondroitin (MOVE FREE PO) Take 1 tablet by mouth 2 (two) times daily.  [name] Kitchen levothyroxine (SYNTHROID, LEVOTHROID) 50 MCG tablet Take 50 mcg by mouth daily.  . metFORMIN (GLUCOPHAGE-XR) 500 MG 24 hr tablet Take  500 mg by mouth 2 (two) times daily.  . metoprolol succinate (TOPROL-XL) 50 MG 24 hr tablet Take 1 tablet (50 mg total) by mouth daily.  . Multiple Vitamin (MULITIVITAMIN WITH MINERALS) TABS Take 1 tablet by mouth daily.  . Multiple Vitamins-Minerals (PRESERVISION AREDS 2) CAPS Take 1 capsule by mouth 2 (two) times daily.   . niacin 500 MG tablet Take 500 mg by mouth daily.  . nitroGLYCERIN (NITROSTAT) 0.4 MG SL tablet Place 1 tablet (0.4 mg total) under the tongue every 5 (five) minutes as needed.  . Omega-3-6-9 CAPS Take 1 capsule by mouth daily.  . Probiotic Product (PROBIOTIC DAILY PO) Take 1 tablet by mouth daily.  . rosuvastatin  (CRESTOR) 5 MG tablet Take 5 mg by mouth every evening.      Allergies:   Patient has no known allergies.   Social History   Socioeconomic History  . Marital status: Married    Spouse name: Not on file  . Number of children: Not on file  . Years of education: Not on file  . Highest education level: Not on file  Occupational History  . Not on file  Social Needs  . Financial resource strain: Not on file  . Food insecurity:    Worry: Not on file    Inability: Not on file  . Transportation needs:    Medical: Not on file    Non-medical: Not on file  Tobacco Use  . Smoking status: Never Smoker  . Smokeless tobacco: Never Used  Substance and Sexual Activity  . Alcohol use: No  . Drug use: No  . Sexual activity: Yes    Comment: married  Lifestyle  . Physical activity:    Days per week: Not on file    Minutes per session: Not on file  . Stress: Not on file  Relationships  . Social connections:    Talks on phone: Not on file    Gets together: Not on file    Attends religious service: Not on file    Active member of club or organization: Not on file    Attends meetings of clubs or organizations: Not on file    Relationship status: Not on file  Other Topics Concern  . Not on file  Social History Narrative  . Not on file     Family History:  The patient's family history includes Colon cancer (age of onset: 8) in his paternal uncle; Coronary artery disease in his father; Heart attack in his mother.   ROS:   Please see the history of present illness.    Review of Systems  Constitution: Negative.  HENT: Negative.   Cardiovascular: Positive for dyspnea on exertion and palpitations.  Respiratory: Positive for snoring.   Endocrine: Negative.   Hematologic/Lymphatic: Negative.   Musculoskeletal: Negative.   Gastrointestinal: Negative.   Genitourinary: Negative.   Neurological: Positive for vertigo.   All other systems reviewed and are negative.   PHYSICAL EXAM:   VS:   BP 126/60   Pulse 70   Ht 6\' 2"  (1.88 m)   Wt 268 lb (121.6 kg)   SpO2 94%   BMI 34.41 kg/m   Physical Exam  GEN: Obese, in no acute distress  Neck: no JVD, carotid bruits, or masses Cardiac:RRR; no murmurs, rubs, or gallops  Respiratory:  clear to auscultation bilaterally, normal work of breathing GI: soft, nontender, nondistended, + BS Ext: Right arm at cath site stable without hematoma or hemorrhage good radial brachial pulses.  Lower extremities without cyanosis, clubbing, or edema, Good distal pulses bilaterally Neuro:  Alert and Oriented x 3 Psych: euthymic mood, full affect  Wt Readings from Last 3 Encounters:  03/19/18 268 lb (121.6 kg)  03/11/18 261 lb (118.4 kg)  03/05/18 266 lb 12.8 oz (121 kg)      Studies/Labs Reviewed:   EKG:  EKG is not ordered today.   Recent Labs: 01/07/2018: TSH 2.150 03/05/2018: BUN 13; Creatinine, Ser 1.03; Hemoglobin 13.8; Platelets 309; Potassium 4.5; Sodium 139   Lipid Panel    Component Value Date/Time   CHOL 129 08/09/2011 0710   TRIG 104 08/09/2011 0710   HDL 36 (L) 08/09/2011 0710   CHOLHDL 3.6 08/09/2011 0710   VLDL 21 08/09/2011 0710   LDLCALC 72 08/09/2011 0710    Additional studies/ records that were reviewed today include:  Cardiac cath 03/11/2018 Cath: 03/11/18    Ost RPDA to RPDA lesion is 95% stenosed.  A drug-eluting stent was successfully placed using a STENT SIERRA 2.25 X 12 MM.  Post intervention, there is a 0% residual stenosis.  Prox LAD to Mid LAD lesion is 40% stenosed.  Ost 2nd Diag to 2nd Diag lesion is 60% stenosed.  Prox Cx lesion is 40% stenosed.  LV end diastolic pressure is normal.   1. Single vessel obstructive CAD involving the PDA. Moderate calcific disease in the LAD and LCx. 2. Normal LV filling pressures 3. Normal right heart pressures and cardiac output 4. Successful PCI of the PDA with DES.   Plan: patient is a candidate for same day discharge. Hold metformin for 48 hours.     Recommend uninterrupted dual antiplatelet therapy with Aspirin 81mg  daily and Clopidogrel 75mg  daily for a minimum of 6 months (stable ischemic heart disease - Class I recommendation). _____________   2D echo 10/18/2019Study Conclusions   - Left ventricle: The cavity size was normal. There was moderate   concentric hypertrophy. Systolic function was normal. The   estimated ejection fraction was in the range of 60% to 65%. Wall   motion was normal; there were no regional wall motion   abnormalities. Doppler parameters are consistent with abnormal   left ventricular relaxation (grade 1 diastolic dysfunction). - Aortic valve: Sclerosis without stenosis. There was no   regurgitation. - Left atrium: The atrium was mildly dilated. - Tricuspid valve: There was mild regurgitation. - Pulmonary arteries: Systolic pressure was mildly increased. PA   peak pressure: 35 mm Hg (S).   Impressions:   - There has been no significant difference since te prior study on   07/27/2013.    ASSESSMENT:    1. Atherosclerosis of native coronary artery of native heart without angina pectoris   2. HYPERTENSION, BENIGN   3. Mixed hyperlipidemia   4. NSVT (nonsustained ventricular tachycardia) (HCC)   5. Vertigo      PLAN:  In order of problems listed above:  CAD status post DES to the PDA 03/11/2018 with moderate 40% proximal to mid LAD 40% circumflex.  Continues to have dyspnea on exertion but no jaw pain.  Starts cardiac rehab next week.  Walking 24 minutes daily without difficulty.  Continue Plavix and aspirin.  Follow-up with Dr. Angelena Form in February.  Essential hypertension blood pressure well controlled.  Hyperlipidemia on Crestor and niacin.  Last LDL 125 11/2016.  He is not fasting today.  He is due for regular checkup with PCP and wants to have the blood drawn there.  I have asked him to make sure  we get a copy of this.  LDL goal less than 70.  NSVT/SVT treated with beta-blocker.  Continue  Toprol-XL 50 mg once daily.  Patient still having some palpitations but much less.  Vertigo patient has had some mild vertigo since the cath.  He had significant vertigo a year ago.  I offered referral for physical therapy but he wants to hold off for now and see if it improves on its own.  Medication Adjustments/Labs and Tests Ordered: Current medicines are reviewed at length with the patient today.  Concerns regarding medicines are outlined above.  Medication changes, Labs and Tests ordered today are listed in the Patient Instructions below. Patient Instructions  Medication Instructions:  Your physician recommends that you continue on your current medications as directed. Please refer to the Current Medication list given to you today.  If you need a refill on your cardiac medications before your next appointment, please call your pharmacy.   Lab work: None  If you have labs (blood work) drawn today and your tests are completely normal, you will receive your results only by: [name] Kitchen MyChart Message (if you have MyChart) OR . A paper copy in the mail If you have any lab test that is abnormal or we need to change your treatment, we will call you to review the results.  Testing/Procedures: None  Follow-Up: Follow up with Dr. Angelena Form on 07/03/2018 @ 4:00 PM    Any Other Special Instructions Will Be Listed Below (If Applicable).  '    Signed, Ermalinda Barrios, PA-C  03/19/2018 11:19 AM    [address], [place], [place]  [id number] Phone: [phone]; Fax: [phone]

## 2018-03-19 ENCOUNTER — Ambulatory Visit: Payer: PPO | Admitting: Physician Assistant

## 2018-03-19 ENCOUNTER — Encounter: Payer: Self-pay | Admitting: Physician Assistant

## 2018-03-19 VITALS — BP 126/60 | HR 70 | Ht 74.0 in | Wt 268.0 lb

## 2018-03-19 DIAGNOSIS — I4729 Other ventricular tachycardia: Secondary | ICD-10-CM

## 2018-03-19 DIAGNOSIS — I472 Ventricular tachycardia: Secondary | ICD-10-CM

## 2018-03-19 DIAGNOSIS — I2510 Atherosclerotic heart disease of native coronary artery without angina pectoris: Secondary | ICD-10-CM | POA: Diagnosis not present

## 2018-03-19 DIAGNOSIS — R42 Dizziness and giddiness: Secondary | ICD-10-CM | POA: Diagnosis not present

## 2018-03-19 DIAGNOSIS — I10 Essential (primary) hypertension: Secondary | ICD-10-CM

## 2018-03-19 DIAGNOSIS — E782 Mixed hyperlipidemia: Secondary | ICD-10-CM

## 2018-03-19 NOTE — Patient Instructions (Signed)
Medication Instructions:  Your physician recommends that you continue on your current medications as directed. Please refer to the Current Medication list given to you today.  If you need a refill on your cardiac medications before your next appointment, please call your pharmacy.   Lab work: None  If you have labs (blood work) drawn today and your tests are completely normal, you will receive your results only by: [name] Kitchen MyChart Message (if you have MyChart) OR . A paper copy in the mail If you have any lab test that is abnormal or we need to change your treatment, we will call you to review the results.  Testing/Procedures: None  Follow-Up: Follow up with Dr. Angelena Form on 07/03/2018 @ 4:00 PM    Any Other Special Instructions Will Be Listed Below (If Applicable).  '

## 2018-03-20 DIAGNOSIS — Z23 Encounter for immunization: Secondary | ICD-10-CM | POA: Diagnosis not present

## 2018-04-01 DIAGNOSIS — E119 Type 2 diabetes mellitus without complications: Secondary | ICD-10-CM | POA: Diagnosis not present

## 2018-04-01 DIAGNOSIS — Z7982 Long term (current) use of aspirin: Secondary | ICD-10-CM | POA: Diagnosis not present

## 2018-04-01 DIAGNOSIS — Z79899 Other long term (current) drug therapy: Secondary | ICD-10-CM | POA: Diagnosis not present

## 2018-04-01 DIAGNOSIS — E785 Hyperlipidemia, unspecified: Secondary | ICD-10-CM | POA: Diagnosis not present

## 2018-04-01 DIAGNOSIS — I10 Essential (primary) hypertension: Secondary | ICD-10-CM | POA: Diagnosis not present

## 2018-04-01 DIAGNOSIS — I2510 Atherosclerotic heart disease of native coronary artery without angina pectoris: Secondary | ICD-10-CM | POA: Diagnosis not present

## 2018-04-01 DIAGNOSIS — Z955 Presence of coronary angioplasty implant and graft: Secondary | ICD-10-CM | POA: Diagnosis not present

## 2018-04-02 ENCOUNTER — Ambulatory Visit: Payer: PPO | Admitting: Physician Assistant

## 2018-04-21 DIAGNOSIS — Z955 Presence of coronary angioplasty implant and graft: Secondary | ICD-10-CM | POA: Diagnosis not present

## 2018-04-30 ENCOUNTER — Other Ambulatory Visit: Payer: Self-pay | Admitting: Cardiology

## 2018-05-26 DIAGNOSIS — E039 Hypothyroidism, unspecified: Secondary | ICD-10-CM | POA: Diagnosis not present

## 2018-05-26 DIAGNOSIS — E119 Type 2 diabetes mellitus without complications: Secondary | ICD-10-CM | POA: Diagnosis not present

## 2018-05-26 DIAGNOSIS — Z79899 Other long term (current) drug therapy: Secondary | ICD-10-CM | POA: Diagnosis not present

## 2018-05-26 DIAGNOSIS — Z955 Presence of coronary angioplasty implant and graft: Secondary | ICD-10-CM | POA: Diagnosis not present

## 2018-05-26 DIAGNOSIS — F419 Anxiety disorder, unspecified: Secondary | ICD-10-CM | POA: Diagnosis not present

## 2018-05-26 DIAGNOSIS — R351 Nocturia: Secondary | ICD-10-CM | POA: Diagnosis not present

## 2018-05-26 DIAGNOSIS — I2510 Atherosclerotic heart disease of native coronary artery without angina pectoris: Secondary | ICD-10-CM | POA: Diagnosis not present

## 2018-05-26 DIAGNOSIS — N401 Enlarged prostate with lower urinary tract symptoms: Secondary | ICD-10-CM | POA: Diagnosis not present

## 2018-05-26 DIAGNOSIS — E785 Hyperlipidemia, unspecified: Secondary | ICD-10-CM | POA: Diagnosis not present

## 2018-05-26 DIAGNOSIS — Z125 Encounter for screening for malignant neoplasm of prostate: Secondary | ICD-10-CM | POA: Diagnosis not present

## 2018-05-26 DIAGNOSIS — K219 Gastro-esophageal reflux disease without esophagitis: Secondary | ICD-10-CM | POA: Diagnosis not present

## 2018-05-26 DIAGNOSIS — Z0000 Encounter for general adult medical examination without abnormal findings: Secondary | ICD-10-CM | POA: Diagnosis not present

## 2018-06-13 ENCOUNTER — Encounter: Payer: Self-pay | Admitting: Cardiovascular Disease

## 2018-06-18 DIAGNOSIS — R293 Abnormal posture: Secondary | ICD-10-CM | POA: Diagnosis not present

## 2018-06-18 DIAGNOSIS — M545 Low back pain: Secondary | ICD-10-CM | POA: Diagnosis not present

## 2018-06-18 DIAGNOSIS — S29019A Strain of muscle and tendon of unspecified wall of thorax, initial encounter: Secondary | ICD-10-CM | POA: Diagnosis not present

## 2018-06-18 DIAGNOSIS — M9902 Segmental and somatic dysfunction of thoracic region: Secondary | ICD-10-CM | POA: Diagnosis not present

## 2018-06-18 DIAGNOSIS — M9904 Segmental and somatic dysfunction of sacral region: Secondary | ICD-10-CM | POA: Diagnosis not present

## 2018-06-18 DIAGNOSIS — S336XXA Sprain of sacroiliac joint, initial encounter: Secondary | ICD-10-CM | POA: Diagnosis not present

## 2018-06-18 DIAGNOSIS — M5136 Other intervertebral disc degeneration, lumbar region: Secondary | ICD-10-CM | POA: Diagnosis not present

## 2018-06-18 DIAGNOSIS — M5386 Other specified dorsopathies, lumbar region: Secondary | ICD-10-CM | POA: Diagnosis not present

## 2018-06-18 DIAGNOSIS — M9903 Segmental and somatic dysfunction of lumbar region: Secondary | ICD-10-CM | POA: Diagnosis not present

## 2018-06-20 DIAGNOSIS — S29019A Strain of muscle and tendon of unspecified wall of thorax, initial encounter: Secondary | ICD-10-CM | POA: Diagnosis not present

## 2018-06-20 DIAGNOSIS — M9902 Segmental and somatic dysfunction of thoracic region: Secondary | ICD-10-CM | POA: Diagnosis not present

## 2018-06-20 DIAGNOSIS — M5136 Other intervertebral disc degeneration, lumbar region: Secondary | ICD-10-CM | POA: Diagnosis not present

## 2018-06-20 DIAGNOSIS — S336XXA Sprain of sacroiliac joint, initial encounter: Secondary | ICD-10-CM | POA: Diagnosis not present

## 2018-06-20 DIAGNOSIS — R293 Abnormal posture: Secondary | ICD-10-CM | POA: Diagnosis not present

## 2018-06-20 DIAGNOSIS — M9903 Segmental and somatic dysfunction of lumbar region: Secondary | ICD-10-CM | POA: Diagnosis not present

## 2018-06-20 DIAGNOSIS — M5386 Other specified dorsopathies, lumbar region: Secondary | ICD-10-CM | POA: Diagnosis not present

## 2018-06-20 DIAGNOSIS — M545 Low back pain: Secondary | ICD-10-CM | POA: Diagnosis not present

## 2018-06-20 DIAGNOSIS — M9904 Segmental and somatic dysfunction of sacral region: Secondary | ICD-10-CM | POA: Diagnosis not present

## 2018-07-03 ENCOUNTER — Ambulatory Visit: Payer: PPO | Admitting: Cardiovascular Disease

## 2018-07-03 ENCOUNTER — Encounter: Payer: Self-pay | Admitting: Cardiovascular Disease

## 2018-07-03 VITALS — BP 126/70 | HR 67 | Ht 74.0 in | Wt 271.2 lb

## 2018-07-03 DIAGNOSIS — I25119 Atherosclerotic heart disease of native coronary artery with unspecified angina pectoris: Secondary | ICD-10-CM

## 2018-07-03 DIAGNOSIS — E782 Mixed hyperlipidemia: Secondary | ICD-10-CM

## 2018-07-03 DIAGNOSIS — I493 Ventricular premature depolarization: Secondary | ICD-10-CM

## 2018-07-03 DIAGNOSIS — I10 Essential (primary) hypertension: Secondary | ICD-10-CM | POA: Diagnosis not present

## 2018-07-03 MED ORDER — ISOSORBIDE MONONITRATE ER 30 MG PO TB24: 15.0000 mg | ORAL_TABLET | Freq: Every day | ORAL | 11 refills | Status: DC

## 2018-07-03 NOTE — Progress Notes (Signed)
Chief Complaint  Patient presents with  . Follow-up    CAD   History of Present Illness: 73 yo male with history of CAD, HTN, HLD, GERD here today for cardiac follow up.  Cardiac cath June 2010 with moderate non-obstructive CAD and LVEF 60%.  He was seen in January 2015 and had c/o heart racing, dizziness, fatigue. Stress myoview 07/27/13 without ischemia. Echo 07/27/13 with normal LV function, no significant valve issues. Event monitor in 2015 without any arrythmias. He called our office in July 2019 with c/o palpitations. Cardiac monitor August 2019 with PVCs, PACs, short run of SVT and 4 beat run of non-sustained VT. Toprol was started. I saw him in the office in October 2019 and he c/o progressive dyspnea and chest pain with exertion. Cardiac cath 03/11/18 with severe stenosis in the right PDA treated with a drug eluting stent. Mild non-obstructive disease in the LAD and Circumflex.   He is here today for follow up. Mild chest pressure and dyspnea with moderate exertion but overall much imrpoved since his PCI. The patient denies any palpitations, lower extremity edema, orthopnea, PND, dizziness, near syncope or syncope.   Primary Care Physician: Street, Sharon Mt, MD  Past Medical History:  Diagnosis Date  . Anxiety   . Arthritis   . CAD (coronary artery disease)    nonobstructive CAD with 40% narrowing in the proximal LAD   . Cataract    removed bilaterally   . Chest pain   . Diabetes mellitus without complication ([place])   . Dizziness   . GERD (gastroesophageal reflux disease)   . Hyperlipidemia   . Hypertension   . Hypothyroidism   . Lumbar herniated disc     Past Surgical History:  Procedure Laterality Date  . CARDIAC CATHETERIZATION  11/11/2008  . CATARACT EXTRACTION Right   . CATARACT EXTRACTION Left   . COLONOSCOPY    . CORONARY STENT INTERVENTION N/A 03/11/2018   Procedure: CORONARY STENT INTERVENTION;  Surgeon: Martinique, Peter M, MD;  Location: [facility];   Service: Cardiovascular;  Laterality: N/A;  . KNEE ARTHROSCOPY Bilateral 2010 Right  . POLYPECTOMY    . RIGHT/LEFT HEART CATH AND CORONARY ANGIOGRAPHY N/A 03/11/2018   Procedure: RIGHT/LEFT HEART CATH AND CORONARY ANGIOGRAPHY;  Surgeon: Martinique, Peter M, MD;  Location: [facility];  Service: Cardiovascular;  Laterality: N/A;    Current Outpatient Medications  Medication Sig Dispense Refill  . amLODipine (NORVASC) 5 MG tablet TAKE 1 TABLET BY MOUTH ONCE DAILY 90 tablet 3  . Ascorbic Acid (VITAMIN C) 1000 MG tablet Take 1,000 mg by mouth daily.    [name] Kitchen aspirin EC 81 MG tablet Take 81 mg by mouth every evening.     . Calcium Carbonate-Vitamin D (CALTRATE 600+D) 600-400 MG-UNIT per tablet Take 1 tablet by mouth daily.    . clopidogrel (PLAVIX) 75 MG tablet TAKE 1 TABLET BY MOUTH ONCE DAILY 30 tablet 6  . Coenzyme Q10 (COQ-10) 400 MG CAPS Take 400 mg by mouth daily.    . diazepam (VALIUM) 2 MG tablet Take 1 tablet (2 mg total) by mouth every 12 (twelve) hours as needed for anxiety. 30 tablet 1  . docusate sodium (COLACE) 100 MG capsule Take 100 mg by mouth daily as needed for mild constipation.    . Glucosamine-Chondroitin (MOVE FREE PO) Take 1 tablet by mouth 2 (two) times daily.    [name] Kitchen levothyroxine (SYNTHROID, LEVOTHROID) 50 MCG tablet Take 50 mcg by mouth daily.    [name] Kitchen  metFORMIN (GLUCOPHAGE-XR) 500 MG 24 hr tablet Take 500 mg by mouth 2 (two) times daily.    . metoprolol succinate (TOPROL-XL) 50 MG 24 hr tablet Take 1 tablet (50 mg total) by mouth daily. 30 tablet 11  . Multiple Vitamin (MULITIVITAMIN WITH MINERALS) TABS Take 1 tablet by mouth daily.    . Multiple Vitamins-Minerals (PRESERVISION AREDS 2) CAPS Take 1 capsule by mouth 2 (two) times daily.     . nitroGLYCERIN (NITROSTAT) 0.4 MG SL tablet Place 1 tablet (0.4 mg total) under the tongue every 5 (five) minutes as needed. 25 tablet 2  . Omega-3-6-9 CAPS Take 1 capsule by mouth daily.    . Probiotic Product (PROBIOTIC DAILY PO) Take 1  tablet by mouth daily.    . rosuvastatin (CRESTOR) 5 MG tablet Take 5 mg by mouth every evening.   0  . isosorbide mononitrate (IMDUR) 30 MG 24 hr tablet Take 0.5 tablets (15 mg total) by mouth daily. 15 tablet 11   No current facility-administered medications for this visit.     No Known Allergies  Social History   Socioeconomic History  . Marital status: Married    Spouse name: Not on file  . Number of children: Not on file  . Years of education: Not on file  . Highest education level: Not on file  Occupational History  . Not on file  Social Needs  . Financial resource strain: Not on file  . Food insecurity:    Worry: Not on file    Inability: Not on file  . Transportation needs:    Medical: Not on file    Non-medical: Not on file  Tobacco Use  . Smoking status: Never Smoker  . Smokeless tobacco: Never Used  Substance and Sexual Activity  . Alcohol use: No  . Drug use: No  . Sexual activity: Yes    Comment: married  Lifestyle  . Physical activity:    Days per week: Not on file    Minutes per session: Not on file  . Stress: Not on file  Relationships  . Social connections:    Talks on phone: Not on file    Gets together: Not on file    Attends religious service: Not on file    Active member of club or organization: Not on file    Attends meetings of clubs or organizations: Not on file    Relationship status: Not on file  . Intimate partner violence:    Fear of current or ex partner: Not on file    Emotionally abused: Not on file    Physically abused: Not on file    Forced sexual activity: Not on file  Other Topics Concern  . Not on file  Social History Narrative  . Not on file    Family History  Problem Relation Age of Onset  . Colon cancer Paternal Uncle 42  . Coronary artery disease Father        with CABG valve replacement in his 5's  . Heart attack Mother        in her 93's  . Esophageal cancer Neg Hx   . Rectal cancer Neg Hx   . Stomach  cancer Neg Hx   . Colon polyps Neg Hx     Review of Systems:  As stated in the HPI and otherwise negative.   BP 126/70   Pulse 67   Ht 6\' 2"  (1.88 m)   Wt 271 lb 3.2 oz (123 kg)  SpO2 93%   BMI 34.82 kg/m   Physical Examination: General: Well developed, well nourished, NAD  HEENT: OP clear, mucus membranes moist  SKIN: warm, dry. No rashes. Neuro: No focal deficits  Musculoskeletal: Muscle strength 5/5 all ext  Psychiatric: Mood and affect normal  Neck: No JVD, no carotid bruits, no thyromegaly, no lymphadenopathy.  Lungs:Clear bilaterally, no wheezes, rhonci, crackles Cardiovascular: Regular rate and rhythm. No murmurs, gallops or rubs. Abdomen:Soft. Bowel sounds present. Non-tender.  Extremities: No lower extremity edema. Pulses are 2 + in the bilateral DP/PT.  Echo October 2019: Left ventricle: The cavity size was normal. There was moderate   concentric hypertrophy. Systolic function was normal. The   estimated ejection fraction was in the range of 60% to 65%. Wall   motion was normal; there were no regional wall motion   abnormalities. Doppler parameters are consistent with abnormal   left ventricular relaxation (grade 1 diastolic dysfunction). - Aortic valve: Sclerosis without stenosis. There was no   regurgitation. - Left atrium: The atrium was mildly dilated. - Tricuspid valve: There was mild regurgitation. - Pulmonary arteries: Systolic pressure was mildly increased. PA   peak pressure: 35 mm Hg (S).  Impressions:  - There has been no significant difference since te prior study on   07/27/2013.  EKG:  EKG is not  ordered today. The ekg ordered today demonstrates   Recent Labs: 01/07/2018: TSH 2.150 03/05/2018: BUN 13; Creatinine, Ser 1.03; Hemoglobin 13.8; Platelets 309; Potassium 4.5; Sodium 139   Lipid Panel Lipid Panel     Component Value Date/Time   CHOL 129 08/09/2011 0710   TRIG 104 08/09/2011 0710   HDL 36 (L) 08/09/2011 0710   CHOLHDL 3.6  08/09/2011 0710   VLDL 21 08/09/2011 0710   LDLCALC 72 08/09/2011 0710     Wt Readings from Last 3 Encounters:  07/03/18 271 lb 3.2 oz (123 kg)  03/19/18 268 lb (121.6 kg)  03/11/18 261 lb (118.4 kg)     Other studies Reviewed: Additional studies/ records that were reviewed today include: . Review of the above records demonstrates:    Assessment and Plan:   1. CAD with angina:  He has mild chest pressure with moderate exertion. Much improved following his stent but still present. Will add Imdur 15 mg daily. Continue ASA, Plavix, statin and beta blocker.   2. HTN: BP is well controlled. No changes today  3. HLD:  Lipids followed in primary care. LDL 67 in January 2020. Continue statin.   4. PVCs/PACs/SVT: Rare palpitations. Continue beta blocker.    Current medicines are reviewed at length with the patient today.  The patient does not have concerns regarding medicines.  The following changes have been made:  Added Norvasc.   Labs/ tests ordered today include:   No orders of the defined types were placed in this encounter.   Disposition:   FU with me in 6 months.    Signed, Lauree Chandler, MD 07/03/2018 4:16 PM    [address], [place], [place]  [address] Phone: [phone]; Fax: [phone]

## 2018-07-03 NOTE — Patient Instructions (Signed)
Medication Instructions:  Your physician has recommended you make the following change in your medication: Start isosorbide mononitrate (Imdur)15 mg by mouth daily.  This will be half of a 30 mg tablet   If you need a refill on your cardiac medications before your next appointment, please call your pharmacy.   Lab work: none If you have labs (blood work) drawn today and your tests are completely normal, you will receive your results only by: [name] Kitchen MyChart Message (if you have MyChart) OR . A paper copy in the mail If you have any lab test that is abnormal or we need to change your treatment, we will call you to review the results.  Testing/Procedures: none  Follow-Up: At [facility], you and your health needs are our priority.  As part of our continuing mission to provide you with exceptional heart care, we have created designated Provider Care Teams.  These Care Teams include your primary Cardiologist (physician) and Advanced Practice Providers (APPs -  Physician Assistants and Nurse Practitioners) who all work together to provide you with the care you need, when you need it. You will need a follow up appointment in 6 months.  Please call our office 4 months in advance to schedule this appointment.  You may see Lauree Chandler, MD or one of the following Advanced Practice Providers on your designated Care Team:   [place], PA-C Melina Copa, PA-C . Ermalinda Barrios, PA-C  Any Other Special Instructions Will Be Listed Below (If Applicable).

## 2018-07-14 ENCOUNTER — Ambulatory Visit: Payer: PPO | Admitting: Cardiovascular Disease

## 2018-08-12 ENCOUNTER — Other Ambulatory Visit: Payer: Self-pay | Admitting: *Deleted

## 2018-08-12 MED ORDER — AMLODIPINE BESYLATE 5 MG PO TABS: 5.0000 mg | ORAL_TABLET | Freq: Every day | ORAL | 3 refills | Status: DC

## 2018-08-12 MED ORDER — METOPROLOL SUCCINATE ER 50 MG PO TB24: 50.0000 mg | ORAL_TABLET | Freq: Every day | ORAL | 3 refills | Status: DC

## 2018-08-12 MED ORDER — ISOSORBIDE MONONITRATE ER 30 MG PO TB24: 15.0000 mg | ORAL_TABLET | Freq: Every day | ORAL | 3 refills | Status: DC

## 2018-08-12 MED ORDER — CLOPIDOGREL BISULFATE 75 MG PO TABS: 75.0000 mg | ORAL_TABLET | Freq: Every day | ORAL | 3 refills | Status: DC

## 2018-09-18 DIAGNOSIS — E039 Hypothyroidism, unspecified: Secondary | ICD-10-CM | POA: Diagnosis not present

## 2018-09-18 DIAGNOSIS — E785 Hyperlipidemia, unspecified: Secondary | ICD-10-CM | POA: Diagnosis not present

## 2018-10-14 DIAGNOSIS — H353131 Nonexudative age-related macular degeneration, bilateral, early dry stage: Secondary | ICD-10-CM | POA: Diagnosis not present

## 2018-10-30 DIAGNOSIS — Z961 Presence of intraocular lens: Secondary | ICD-10-CM | POA: Diagnosis not present

## 2018-10-30 DIAGNOSIS — H26491 Other secondary cataract, right eye: Secondary | ICD-10-CM | POA: Diagnosis not present

## 2018-10-30 DIAGNOSIS — H02831 Dermatochalasis of right upper eyelid: Secondary | ICD-10-CM | POA: Diagnosis not present

## 2018-10-30 DIAGNOSIS — H18413 Arcus senilis, bilateral: Secondary | ICD-10-CM | POA: Diagnosis not present

## 2018-12-08 DIAGNOSIS — R361 Hematospermia: Secondary | ICD-10-CM | POA: Diagnosis not present

## 2018-12-08 DIAGNOSIS — N419 Inflammatory disease of prostate, unspecified: Secondary | ICD-10-CM | POA: Diagnosis not present

## 2018-12-08 DIAGNOSIS — E785 Hyperlipidemia, unspecified: Secondary | ICD-10-CM | POA: Diagnosis not present

## 2018-12-08 DIAGNOSIS — E039 Hypothyroidism, unspecified: Secondary | ICD-10-CM | POA: Diagnosis not present

## 2018-12-08 DIAGNOSIS — N401 Enlarged prostate with lower urinary tract symptoms: Secondary | ICD-10-CM | POA: Diagnosis not present

## 2018-12-08 DIAGNOSIS — Z79899 Other long term (current) drug therapy: Secondary | ICD-10-CM | POA: Diagnosis not present

## 2018-12-08 DIAGNOSIS — I2510 Atherosclerotic heart disease of native coronary artery without angina pectoris: Secondary | ICD-10-CM | POA: Diagnosis not present

## 2018-12-08 DIAGNOSIS — F411 Generalized anxiety disorder: Secondary | ICD-10-CM | POA: Diagnosis not present

## 2018-12-08 DIAGNOSIS — R351 Nocturia: Secondary | ICD-10-CM | POA: Diagnosis not present

## 2018-12-08 DIAGNOSIS — K219 Gastro-esophageal reflux disease without esophagitis: Secondary | ICD-10-CM | POA: Diagnosis not present

## 2018-12-08 DIAGNOSIS — E1169 Type 2 diabetes mellitus with other specified complication: Secondary | ICD-10-CM | POA: Diagnosis not present

## 2018-12-08 DIAGNOSIS — Z87448 Personal history of other diseases of urinary system: Secondary | ICD-10-CM | POA: Diagnosis not present

## 2018-12-18 ENCOUNTER — Other Ambulatory Visit: Payer: Self-pay

## 2018-12-22 DIAGNOSIS — M25562 Pain in left knee: Secondary | ICD-10-CM | POA: Diagnosis not present

## 2018-12-22 DIAGNOSIS — M1712 Unilateral primary osteoarthritis, left knee: Secondary | ICD-10-CM | POA: Diagnosis not present

## 2019-01-15 ENCOUNTER — Telehealth: Payer: Self-pay | Admitting: *Deleted

## 2019-01-15 NOTE — Telephone Encounter (Signed)
Called  Re: appt 01/19/2019.  It is scheduled as virtual and provider will now be in office.  Want to offer in-office appt.  If not, can leave virtual..

## 2019-01-18 NOTE — Progress Notes (Signed)
Virtual Visit via Telephone Note   This visit type was conducted due to national recommendations for restrictions regarding the COVID-19 Pandemic (e.g. social distancing) in an effort to limit this patient's exposure and mitigate transmission in our community.  Due to his co-morbid illnesses, this patient is at least at moderate risk for complications without adequate follow up.  This format is felt to be most appropriate for this patient at this time.  The patient did not have access to video technology/had technical difficulties with video requiring transitioning to audio format only (telephone).  All issues noted in this document were discussed and addressed.  No physical exam could be performed with this format.  Please refer to the patient's chart for his  consent to telehealth for [facility].   Date:  01/19/2019   ID:  Peter Brooks, DOB Jun 11, 1945, MRN [id number]  Patient Location: Home Provider Location: Office  PCP:  Street, Sharon Mt, MD  Cardiologist:  Lauree Chandler, MD Evaluation Performed:  Follow-Up Visit  Chief Complaint: Shortness of breath and chest pain  History of Present Illness:    Peter Brooks is a 73 y.o. male with  history of CAD, HTN, HLD, GERD seen for CP and dyspnea.   Cardiac cath June 2010 with moderate non-obstructive CAD and LVEF 60%.  He was seen in January 2015 and had c/o heart racing, dizziness, fatigue. Stress myoview 07/27/13 without ischemia. Echo 07/27/13 with normal LV function, no significant valve issues. Event monitor in 2015 without any arrythmias. He called our office in July 2019 with c/o palpitations. Cardiac monitor August 2019 with PVCs, PACs, short run of SVT and 4 beat run of non-sustained VT. Toprol was started. I saw him in the office in October 2019 and he c/o progressive dyspnea and chest pain with exertion. Cardiac cath 03/11/18 with severe stenosis in the right PDA treated with a drug eluting stent. Mild  non-obstructive disease in the LAD and Circumflex.   Last seen by Dr. Angelena Form 06/2018 for follow up. Still with chest pressure with improved following PCI. Added Imdur 78m qd.   Seen virtually today for progressively worsened chest pain, shortness of breath and palpitation.  This been ongoing for past 4 to 6-week but worsening past 2 weeks.  He describes substernal chest pressure with shortness of breath with exertion.  He walks 15 minutes 3 times per day.  His symptoms resolved with rest.  No radiation.  He also has intermittent palpitation lasting for 15 minutes.  He denies syncope, dizziness, orthopnea, PND or lower extremity edema.  Symptoms similar prior to his PCI.  The patient does not have symptoms concerning for COVID-19 infection (fever, chills, cough, or new shortness of breath).    Past Medical History:  Diagnosis Date  . Anxiety   . Arthritis   . CAD (coronary artery disease)    nonobstructive CAD with 40% narrowing in the proximal LAD   . Cataract    removed bilaterally   . Chest pain   . Diabetes mellitus without complication ([place]   . Dizziness   . GERD (gastroesophageal reflux disease)   . Hyperlipidemia   . Hypertension   . Hypothyroidism   . Lumbar herniated disc    Past Surgical History:  Procedure Laterality Date  . CARDIAC CATHETERIZATION  11/11/2008  . CATARACT EXTRACTION Right   . CATARACT EXTRACTION Left   . COLONOSCOPY    . CORONARY STENT INTERVENTION N/A 03/11/2018   Procedure: CORONARY STENT INTERVENTION;  Surgeon: Martinique, Peter M, MD;  Location: [place] [facility];  Service: Cardiovascular;  Laterality: N/A;  . KNEE ARTHROSCOPY Bilateral 2010 Right  . POLYPECTOMY    . RIGHT/LEFT HEART CATH AND CORONARY ANGIOGRAPHY N/A 03/11/2018   Procedure: RIGHT/LEFT HEART CATH AND CORONARY ANGIOGRAPHY;  Surgeon: Martinique, Peter M, MD;  Location: [place] [facility];  Service: Cardiovascular;  Laterality: N/A;     Current Meds  Medication Sig  . amLODipine  (NORVASC) 5 MG tablet Take 1 tablet (5 mg total) by mouth daily.  . Ascorbic Acid (VITAMIN C) 1000 MG tablet Take 1,000 mg by mouth daily.  [name] Kitchen aspirin EC 81 MG tablet Take 81 mg by mouth every evening.   . Blood Glucose Monitoring Suppl (ONE TOUCH ULTRA 2) w/Device KIT See admin instructions.  . Calcium Carbonate-Vitamin D (CALTRATE 600+D) 600-400 MG-UNIT per tablet Take 1 tablet by mouth daily.  . clopidogrel (PLAVIX) 75 MG tablet Take 1 tablet (75 mg total) by mouth daily.  . Coenzyme Q10 (COQ-10) 400 MG CAPS Take 400 mg by mouth daily.  . diazepam (VALIUM) 2 MG tablet Take 1 tablet (2 mg total) by mouth every 12 (twelve) hours as needed for anxiety.  . docusate sodium (COLACE) 100 MG capsule Take 100 mg by mouth daily as needed for mild constipation.  . finasteride (PROSCAR) 5 MG tablet Take 5 mg by mouth daily.  [name] Kitchen levothyroxine (SYNTHROID, LEVOTHROID) 50 MCG tablet Take 50 mcg by mouth daily.  . metFORMIN (GLUCOPHAGE-XR) 500 MG 24 hr tablet Take 500 mg by mouth 2 (two) times daily.  . Multiple Vitamin (MULITIVITAMIN WITH MINERALS) TABS Take 1 tablet by mouth daily.  . Multiple Vitamins-Minerals (PRESERVISION AREDS 2) CAPS Take 1 capsule by mouth 2 (two) times daily.   . nitroGLYCERIN (NITROSTAT) 0.4 MG SL tablet Place 1 tablet (0.4 mg total) under the tongue every 5 (five) minutes as needed.  Glory Rosebush ULTRA test strip USE 1 STRIP TO CHECK GLUCOSE ONCE DAILY  . Probiotic Product (PROBIOTIC DAILY PO) Take 1 tablet by mouth daily.  . rosuvastatin (CRESTOR) 5 MG tablet Take 5 mg by mouth every evening.   . tamsulosin (FLOMAX) 0.4 MG CAPS capsule TAKE 1 CAPSULE BY MOUTH EVERY DAY AT BEDTIME  . [DISCONTINUED] metoprolol succinate (TOPROL-XL) 50 MG 24 hr tablet Take 1 tablet (50 mg total) by mouth daily.     Allergies:   Patient has no known allergies.   Social History   Tobacco Use  . Smoking status: Never Smoker  . Smokeless tobacco: Never Used  Substance Use Topics  . Alcohol use: No   . Drug use: No     Family Hx: The patient's family history includes Colon cancer (age of onset: 75) in his paternal uncle; Coronary artery disease in his father; Heart attack in his mother. There is no history of Esophageal cancer, Rectal cancer, Stomach cancer, or Colon polyps.  ROS:   Please see the history of present illness.    All other systems reviewed and are negative.   Prior CV studies:   The following studies were reviewed today:  CORONARY STENT INTERVENTION  02/2018  RIGHT/LEFT HEART CATH AND CORONARY ANGIOGRAPHY  Conclusion    Ost RPDA to RPDA lesion is 95% stenosed.  A drug-eluting stent was successfully placed using a STENT SIERRA 2.25 X 12 MM.  Post intervention, there is a 0% residual stenosis.  Prox LAD to Mid LAD lesion is 40% stenosed.  Ost 2nd Diag to 2nd Diag lesion  is 60% stenosed.  Prox Cx lesion is 40% stenosed.  LV end diastolic pressure is normal.   1. Single vessel obstructive CAD involving the PDA. Moderate calcific disease in the LAD and LCx. 2. Normal LV filling pressures 3. Normal right heart pressures and cardiac output 4. Successful PCI of the PDA with DES.  Plan: patient is a candidate for same day discharge. Hold metformin for 48 hours.  Recommend uninterrupted dual antiplatelet therapy with Aspirin 48m daily and Clopidogrel 722mdaily for a minimum of 6 months (stable ischemic heart disease - Class I recommendation).   Echo 02/2018 Study Conclusions  - Left ventricle: The cavity size was normal. There was moderate   concentric hypertrophy. Systolic function was normal. The   estimated ejection fraction was in the range of 60% to 65%. Wall   motion was normal; there were no regional wall motion   abnormalities. Doppler parameters are consistent with abnormal   left ventricular relaxation (grade 1 diastolic dysfunction). - Aortic valve: Sclerosis without stenosis. There was no   regurgitation. - Left atrium: The atrium was  mildly dilated. - Tricuspid valve: There was mild regurgitation. - Pulmonary arteries: Systolic pressure was mildly increased. PA   peak pressure: 35 mm Hg (S).  Impressions:  - There has been no significant difference since te prior study on   07/27/2013.  Labs/Other Tests and Data Reviewed:    EKG:  No ECG reviewed.  Recent Labs: 03/05/2018: BUN 13; Creatinine, Ser 1.03; Hemoglobin 13.8; Platelets 309; Potassium 4.5; Sodium 139   Recent Lipid Panel Lab Results  Component Value Date/Time   CHOL 129 08/09/2011 07:10 AM   TRIG 104 08/09/2011 07:10 AM   HDL 36 (L) 08/09/2011 07:10 AM   CHOLHDL 3.6 08/09/2011 07:10 AM   LDLCALC 72 08/09/2011 07:10 AM    Wt Readings from Last 3 Encounters:  01/19/19 265 lb (120.2 kg)  07/03/18 271 lb 3.2 oz (123 kg)  03/19/18 268 lb (121.6 kg)     Objective:    Vital Signs:  BP (!) 144/88   Pulse 80   Ht _0  (1.88 m)   Wt 265 lb (120.2 kg)   BMI 34.02 kg/m    VITAL SIGNS:  reviewed GEN:  no acute distress PSYCH:  normal affect  ASSESSMENT & PLAN:    1. Chest pain and shortness of breath concerning for exertional angina with history of CAD Mostly occurs with exertion and relieved with rest.  Never tried sublingual nitroglycerin.  Symptoms similar prior to his PCI.  Worsening in the past 2 weeks.  Discussed invasive versus noninvasive evaluation. Continue ASA, Plavix, statin and BB.   2. Palpitations - Get 2 weeks zio monitor. Increase BB.   3. HTN - Elevated. Increase BB.   4. HLD - No results found for requested labs within last 8760 hours.  - Continue statin.  - Reevaluate during follow up  COVID-19 Education: The signs and symptoms of COVID-19 were discussed with the patient and how to seek care for testing (follow up with PCP or arrange E-visit).  The importance of social distancing was discussed today.  Time:   Today, I have spent 12  minutes with the patient with telehealth technology discussing the above problems.      Medication Adjustments/Labs and Tests Ordered: Current medicines are reviewed at length with the patient today.  Concerns regarding medicines are outlined above.   Tests Ordered: Orders Placed This Encounter  Procedures  . LONG TERM MONITOR (3-14 DAYS)  .  MYOCARDIAL PERFUSION IMAGING    Medication Changes: Meds ordered this encounter  Medications  . metoprolol succinate (TOPROL XL) 25 MG 24 hr tablet    Sig: Take 3 tablets (75 mg total) by mouth daily.    Dispense:  90 tablet    Refill:  11    Follow Up:  In Person in 3 week(s)  Signed, Leanor Kail, [place]  01/19/2019 11:15 AM    [place]

## 2019-01-19 ENCOUNTER — Telehealth: Payer: Self-pay | Admitting: *Deleted

## 2019-01-19 ENCOUNTER — Encounter: Payer: Self-pay | Admitting: Physician Assistant

## 2019-01-19 ENCOUNTER — Other Ambulatory Visit: Payer: Self-pay

## 2019-01-19 ENCOUNTER — Telehealth (INDEPENDENT_AMBULATORY_CARE_PROVIDER_SITE_OTHER): Payer: PPO | Admitting: Physician Assistant

## 2019-01-19 VITALS — BP 144/88 | HR 80 | Ht 74.0 in | Wt 265.0 lb

## 2019-01-19 DIAGNOSIS — R002 Palpitations: Secondary | ICD-10-CM

## 2019-01-19 DIAGNOSIS — I25110 Atherosclerotic heart disease of native coronary artery with unstable angina pectoris: Secondary | ICD-10-CM | POA: Diagnosis not present

## 2019-01-19 DIAGNOSIS — I10 Essential (primary) hypertension: Secondary | ICD-10-CM

## 2019-01-19 DIAGNOSIS — E782 Mixed hyperlipidemia: Secondary | ICD-10-CM

## 2019-01-19 DIAGNOSIS — R079 Chest pain, unspecified: Secondary | ICD-10-CM

## 2019-01-19 MED ORDER — METOPROLOL SUCCINATE ER 25 MG PO TB24: 75.0000 mg | ORAL_TABLET | Freq: Every day | ORAL | 11 refills | Status: DC

## 2019-01-19 NOTE — Telephone Encounter (Signed)
14 ZIO XT long term holter monitor to be mailed to patients home.  Instructions reviewed briefly as they are included in the monitor kit. 

## 2019-01-19 NOTE — Patient Instructions (Signed)
Medication Instructions:  Your physician has recommended you make the following change in your medication:  1.  INCREASE the Metoprolol Xl to 25 mg taking 3 tablets by mouth in a.m  If you need a refill on your cardiac medications before your next appointment, please call your pharmacy.   Lab work: None ordered If you have labs (blood work) drawn today and your tests are completely normal, you will receive your results only by: [name] Kitchen MyChart Message (if you have MyChart) OR . A paper copy in the mail If you have any lab test that is abnormal or we need to change your treatment, we will call you to review the results.  Testing/Procedures: Your physician has requested that you have a lexiscan myoview. For further information please visit [email or web address]. Please follow instruction sheet, as given.   Your physician has recommended that you wear a 14 day heart monitor.  SOMEONE WILL CALL YOU TO ARRANGE THIS.    Follow-Up: At [facility], you and your health needs are our priority.  As part of our continuing mission to provide you with exceptional heart care, we have created designated Provider Care Teams.  These Care Teams include your primary Cardiologist (physician) and Advanced Practice Providers (APPs -  Physician Assistants and Nurse Practitioners) who all work together to provide you with the care you need, when you need it. You will need a follow up appointment in:  WITH VIN BHAGAT, PA-C, AFTER THE STRESS TEST  Any Other Special Instructions Will Be Listed Below (If Applicable).  Regadenoson injection What is this medicine? REGADENOSON is used to test the heart for coronary artery disease. It is used in patients who can not exercise for their stress test. This medicine may be used for other purposes; ask your health care provider or pharmacist if you have questions. COMMON BRAND NAME(S): Lexiscan What should I tell my health care provider before I take this medicine? They need to  know if you have any of these conditions:  heart problems  lung or breathing disease, like asthma or COPD  an unusual or allergic reaction to regadenoson, other medicines, foods, dyes, or preservatives  pregnant or trying to get pregnant  breast-feeding How should I use this medicine? This medicine is for injection into a vein. It is given by a health care professional in a hospital or clinic setting. Talk to your pediatrician regarding the use of this medicine in children. Special care may be needed. Overdosage: If you think you have taken too much of this medicine contact a poison control center or emergency room at once. NOTE: This medicine is only for you. Do not share this medicine with others. What if I miss a dose? This does not apply. What may interact with this medicine?  caffeine  dipyridamole  guarana  theophylline This list may not describe all possible interactions. Give your health care provider a list of all the medicines, herbs, non-prescription drugs, or dietary supplements you use. Also tell them if you smoke, drink alcohol, or use illegal drugs. Some items may interact with your medicine. What should I watch for while using this medicine? Your condition will be monitored carefully while you are receiving this medicine. Do not take medicines, foods, or drinks with caffeine (like coffee, tea, or colas) for at least 12 hours before your test. If you do not know if something contains caffeine, ask your health care professional. What side effects may I notice from receiving this medicine? Side effects that you should  report to your doctor or health care professional as soon as possible:  allergic reactions like skin rash, itching or hives, swelling of the face, lips, or tongue  breathing problems  chest pain, tightness or palpitations  severe headache Side effects that usually do not require medical attention (report to your doctor or health care professional if  they continue or are bothersome):  flushing  headache  irritation or pain at site where injected  nausea, vomiting This list may not describe all possible side effects. Call your doctor for medical advice about side effects. You may report side effects to FDA at 1-800-FDA-1088. Where should I keep my medicine? This drug is given in a hospital or clinic and will not be stored at home. NOTE: This sheet is a summary. It may not cover all possible information. If you have questions about this medicine, talk to your doctor, pharmacist, or health care provider.  2020 Elsevier/Gold Standard (2008-01-05 15:08:13)

## 2019-01-19 NOTE — Telephone Encounter (Signed)

## 2019-01-26 ENCOUNTER — Ambulatory Visit (INDEPENDENT_AMBULATORY_CARE_PROVIDER_SITE_OTHER): Payer: PPO

## 2019-01-26 DIAGNOSIS — R002 Palpitations: Secondary | ICD-10-CM | POA: Diagnosis not present

## 2019-01-28 ENCOUNTER — Encounter: Payer: Self-pay | Admitting: *Deleted

## 2019-01-29 DIAGNOSIS — M25562 Pain in left knee: Secondary | ICD-10-CM | POA: Diagnosis not present

## 2019-01-29 DIAGNOSIS — M1712 Unilateral primary osteoarthritis, left knee: Secondary | ICD-10-CM | POA: Diagnosis not present

## 2019-02-05 ENCOUNTER — Telehealth: Payer: Self-pay | Admitting: Cardiovascular Disease

## 2019-02-05 DIAGNOSIS — M25562 Pain in left knee: Secondary | ICD-10-CM | POA: Diagnosis not present

## 2019-02-05 DIAGNOSIS — M1712 Unilateral primary osteoarthritis, left knee: Secondary | ICD-10-CM | POA: Diagnosis not present

## 2019-02-05 NOTE — Telephone Encounter (Signed)
Confirmed appointment request stated monitor would be mailed in 3-5 days.  Office note stated monitor duration 14 days.

## 2019-02-05 NOTE — Telephone Encounter (Signed)
New message:    Patient calling because a letter was sent to him concering his monitor and dose not understand. Please call patient.

## 2019-02-12 DIAGNOSIS — M25562 Pain in left knee: Secondary | ICD-10-CM | POA: Diagnosis not present

## 2019-02-12 DIAGNOSIS — M1712 Unilateral primary osteoarthritis, left knee: Secondary | ICD-10-CM | POA: Diagnosis not present

## 2019-02-16 ENCOUNTER — Telehealth (HOSPITAL_COMMUNITY): Payer: Self-pay

## 2019-02-16 ENCOUNTER — Telehealth (HOSPITAL_COMMUNITY): Payer: Self-pay | Admitting: *Deleted

## 2019-02-16 NOTE — Telephone Encounter (Signed)
Patient given detailed instructions per Myocardial Perfusion Study Information Sheet for the test on 02/20/2019 at 8:00. Patient notified to arrive 15 minutes early and that it is imperative to arrive on time for appointment to keep from having the test rescheduled.  If you need to cancel or reschedule your appointment, please call the office within 24 hours of your appointment. . Patient verbalized understanding.EHK

## 2019-02-16 NOTE — Telephone Encounter (Signed)
Left message on voicemail in reference to upcoming appointment scheduled for 02/20/19. Phone number given for a call back so details instructions can be given. Peter Brooks

## 2019-02-17 DIAGNOSIS — R002 Palpitations: Secondary | ICD-10-CM | POA: Diagnosis not present

## 2019-02-18 DIAGNOSIS — E039 Hypothyroidism, unspecified: Secondary | ICD-10-CM | POA: Diagnosis not present

## 2019-02-18 DIAGNOSIS — E1169 Type 2 diabetes mellitus with other specified complication: Secondary | ICD-10-CM | POA: Diagnosis not present

## 2019-02-18 DIAGNOSIS — K219 Gastro-esophageal reflux disease without esophagitis: Secondary | ICD-10-CM | POA: Diagnosis not present

## 2019-02-18 DIAGNOSIS — E785 Hyperlipidemia, unspecified: Secondary | ICD-10-CM | POA: Diagnosis not present

## 2019-02-20 ENCOUNTER — Other Ambulatory Visit: Payer: Self-pay

## 2019-02-20 ENCOUNTER — Ambulatory Visit (HOSPITAL_COMMUNITY): Payer: PPO | Attending: Cardiology

## 2019-02-20 DIAGNOSIS — R079 Chest pain, unspecified: Secondary | ICD-10-CM | POA: Insufficient documentation

## 2019-02-20 LAB — MYOCARDIAL PERFUSION IMAGING
LV dias vol: 106 mL (ref 62–150)
LV sys vol: 42 mL
Peak HR: 82 {beats}/min
Rest HR: 57 {beats}/min
SDS: 2
SRS: 0
SSS: 2
TID: 1.08

## 2019-02-20 MED ORDER — TECHNETIUM TC 99M TETROFOSMIN IV KIT
32.7000 | PACK | Freq: Once | INTRAVENOUS | Status: AC | PRN
  Administered 2019-02-20: 32.7 via INTRAVENOUS
  Filled 2019-02-20: qty 33

## 2019-02-20 MED ORDER — REGADENOSON 0.4 MG/5ML IV SOLN
0.4000 mg | Freq: Once | INTRAVENOUS | Status: AC
  Administered 2019-02-20: 0.4 mg via INTRAVENOUS

## 2019-02-20 MED ORDER — TECHNETIUM TC 99M TETROFOSMIN IV KIT
10.7000 | PACK | Freq: Once | INTRAVENOUS | Status: AC | PRN
  Administered 2019-02-20: 10.7 via INTRAVENOUS
  Filled 2019-02-20: qty 11

## 2019-02-23 ENCOUNTER — Ambulatory Visit: Payer: PPO | Admitting: Cardiovascular Disease

## 2019-02-23 ENCOUNTER — Encounter: Payer: Self-pay | Admitting: Cardiovascular Disease

## 2019-02-23 ENCOUNTER — Telehealth: Payer: Self-pay | Admitting: *Deleted

## 2019-02-23 ENCOUNTER — Other Ambulatory Visit: Payer: Self-pay

## 2019-02-23 VITALS — BP 116/70 | HR 62 | Ht 74.0 in | Wt 276.0 lb

## 2019-02-23 DIAGNOSIS — I10 Essential (primary) hypertension: Secondary | ICD-10-CM

## 2019-02-23 DIAGNOSIS — I25119 Atherosclerotic heart disease of native coronary artery with unspecified angina pectoris: Secondary | ICD-10-CM | POA: Diagnosis not present

## 2019-02-23 DIAGNOSIS — R002 Palpitations: Secondary | ICD-10-CM

## 2019-02-23 DIAGNOSIS — E782 Mixed hyperlipidemia: Secondary | ICD-10-CM

## 2019-02-23 NOTE — Progress Notes (Signed)
Chief Complaint  Patient presents with  . Follow-up    CAD   History of Present Illness: 73 yo male with history of CAD, HTN, HLD, GERD, SVT here today for cardiac follow up.  Cardiac cath June 2010 with non-obstructive CAD and LVEF 60%.  He was seen in January 2015 and had c/o heart racing, dizziness, fatigue. Stress myoview 07/27/13 without ischemia. Echo 07/27/13 with normal LV function, no significant valve issues. Event monitor in 2015 without any arrythmias. He called our office in July 2019 with c/o palpitations. Cardiac monitor August 2019 with PVCs, PACs, short run of SVT and 4 beat run of non-sustained VT. Toprol was started. I saw him in the office in October 2019 and he c/o progressive dyspnea and chest pain with exertion. Cardiac cath 03/11/18 with severe stenosis in the right PDA treated with a drug eluting stent. Mild non-obstructive disease in the LAD and Circumflex. He was seen via telemedicine visit 01/19/19 and had c/o chest pain with exertion. Nuclear stress test 02/20/19 with no evidence of ischemia.   He is here today for follow up. The patient denies any chest pain, dyspnea, palpitations, lower extremity edema, orthopnea, PND, dizziness, near syncope or syncope.   Primary Care Physician: Street, Sharon Mt, MD  Past Medical History:  Diagnosis Date  . Anxiety   . Arthritis   . CAD (coronary artery disease)    nonobstructive CAD with 40% narrowing in the proximal LAD   . Cataract    removed bilaterally   . Chest pain   . Diabetes mellitus without complication ([place])   . Dizziness   . GERD (gastroesophageal reflux disease)   . Hyperlipidemia   . Hypertension   . Hypothyroidism   . Lumbar herniated disc     Past Surgical History:  Procedure Laterality Date  . CARDIAC CATHETERIZATION  11/11/2008  . CATARACT EXTRACTION Right   . CATARACT EXTRACTION Left   . COLONOSCOPY    . CORONARY STENT INTERVENTION N/A 03/11/2018   Procedure: CORONARY STENT INTERVENTION;   Surgeon: Martinique, Peter M, MD;  Location: [place] [facility];  Service: Cardiovascular;  Laterality: N/A;  . KNEE ARTHROSCOPY Bilateral 2010 Right  . POLYPECTOMY    . RIGHT/LEFT HEART CATH AND CORONARY ANGIOGRAPHY N/A 03/11/2018   Procedure: RIGHT/LEFT HEART CATH AND CORONARY ANGIOGRAPHY;  Surgeon: Martinique, Peter M, MD;  Location: [place] [facility];  Service: Cardiovascular;  Laterality: N/A;    Current Outpatient Medications  Medication Sig Dispense Refill  . amLODipine (NORVASC) 5 MG tablet Take 1 tablet (5 mg total) by mouth daily. 90 tablet 3  . Ascorbic Acid (VITAMIN C) 1000 MG tablet Take 1,000 mg by mouth daily.    [name] Kitchen aspirin EC 81 MG tablet Take 81 mg by mouth every evening.     . Blood Glucose Monitoring Suppl (ONE TOUCH ULTRA 2) w/Device KIT See admin instructions.    . Calcium Carbonate-Vitamin D (CALTRATE 600+D) 600-400 MG-UNIT per tablet Take 1 tablet by mouth daily.    . clopidogrel (PLAVIX) 75 MG tablet Take 1 tablet (75 mg total) by mouth daily. 90 tablet 3  . Coenzyme Q10 (COQ-10) 400 MG CAPS Take 400 mg by mouth daily.    . diazepam (VALIUM) 2 MG tablet Take 1 tablet (2 mg total) by mouth every 12 (twelve) hours as needed for anxiety. 30 tablet 1  . docusate sodium (COLACE) 100 MG capsule Take 100 mg by mouth daily as needed for mild constipation.    [name] Kitchen  finasteride (PROSCAR) 5 MG tablet Take 5 mg by mouth daily.    [name] Kitchen levothyroxine (SYNTHROID, LEVOTHROID) 50 MCG tablet Take 50 mcg by mouth daily.    . metFORMIN (GLUCOPHAGE-XR) 500 MG 24 hr tablet Take 500 mg by mouth 2 (two) times daily.    . metoprolol succinate (TOPROL XL) 25 MG 24 hr tablet Take 3 tablets (75 mg total) by mouth daily. 90 tablet 11  . Multiple Vitamin (MULITIVITAMIN WITH MINERALS) TABS Take 1 tablet by mouth daily.    . Multiple Vitamins-Minerals (PRESERVISION AREDS 2) CAPS Take 1 capsule by mouth 2 (two) times daily.     . nitroGLYCERIN (NITROSTAT) 0.4 MG SL tablet Place 1 tablet (0.4 mg total) under the  tongue every 5 (five) minutes as needed. 25 tablet 2  . ONETOUCH ULTRA test strip USE 1 STRIP TO CHECK GLUCOSE ONCE DAILY    . Probiotic Product (PROBIOTIC DAILY PO) Take 1 tablet by mouth daily.    . rosuvastatin (CRESTOR) 5 MG tablet Take 5 mg by mouth every evening.   0  . tamsulosin (FLOMAX) 0.4 MG CAPS capsule TAKE 1 CAPSULE BY MOUTH EVERY DAY AT BEDTIME     No current facility-administered medications for this visit.     No Known Allergies  Social History   Socioeconomic History  . Marital status: Married    Spouse name: Not on file  . Number of children: Not on file  . Years of education: Not on file  . Highest education level: Not on file  Occupational History  . Not on file  Social Needs  . Financial resource strain: Not on file  . Food insecurity    Worry: Not on file    Inability: Not on file  . Transportation needs    Medical: Not on file    Non-medical: Not on file  Tobacco Use  . Smoking status: Never Smoker  . Smokeless tobacco: Never Used  Substance and Sexual Activity  . Alcohol use: No  . Drug use: No  . Sexual activity: Yes    Comment: married  Lifestyle  . Physical activity    Days per week: Not on file    Minutes per session: Not on file  . Stress: Not on file  Relationships  . Social Herbalist on phone: Not on file    Gets together: Not on file    Attends religious service: Not on file    Active member of club or organization: Not on file    Attends meetings of clubs or organizations: Not on file    Relationship status: Not on file  . Intimate partner violence    Fear of current or ex partner: Not on file    Emotionally abused: Not on file    Physically abused: Not on file    Forced sexual activity: Not on file  Other Topics Concern  . Not on file  Social History Narrative  . Not on file    Family History  Problem Relation Age of Onset  . Colon cancer Paternal Uncle 63  . Coronary artery disease Father        with CABG  valve replacement in his 19's  . Heart attack Mother        in her 71's  . Esophageal cancer Neg Hx   . Rectal cancer Neg Hx   . Stomach cancer Neg Hx   . Colon polyps Neg Hx     Review of Systems:  As stated in the HPI and otherwise negative.   BP 116/70   Pulse 62   Ht _0  (1.88 m)   Wt 276 lb (125.2 kg)   SpO2 95%   BMI 35.44 kg/m   Physical Examination: General: Well developed, well nourished, NAD  HEENT: OP clear, mucus membranes moist  SKIN: warm, dry. No rashes. Neuro: No focal deficits  Musculoskeletal: Muscle strength 5/5 all ext  Psychiatric: Mood and affect normal  Neck: No JVD, no carotid bruits, no thyromegaly, no lymphadenopathy.  Lungs:Clear bilaterally, no wheezes, rhonci, crackles Cardiovascular: Regular rate and rhythm. No murmurs, gallops or rubs. Abdomen:Soft. Bowel sounds present. Non-tender.  Extremities: No lower extremity edema. Pulses are 2 + in the bilateral DP/PT.  Echo October 2019: Left ventricle: The cavity size was normal. There was moderate   concentric hypertrophy. Systolic function was normal. The   estimated ejection fraction was in the range of 60% to 65%. Wall   motion was normal; there were no regional wall motion   abnormalities. Doppler parameters are consistent with abnormal   left ventricular relaxation (grade 1 diastolic dysfunction). - Aortic valve: Sclerosis without stenosis. There was no   regurgitation. - Left atrium: The atrium was mildly dilated. - Tricuspid valve: There was mild regurgitation. - Pulmonary arteries: Systolic pressure was mildly increased. PA   peak pressure: 35 mm Hg (S).  Impressions:  - There has been no significant difference since te prior study on   07/27/2013.  EKG:  EKG is ordered today. The ekg ordered today demonstrates NSR, rate 62 bpm. LVH  Recent Labs: 03/05/2018: BUN 13; Creatinine, Ser 1.03; Hemoglobin 13.8; Platelets 309; Potassium 4.5; Sodium 139   Lipid Panel Followed in  primary care   Wt Readings from Last 3 Encounters:  02/23/19 276 lb (125.2 kg)  02/20/19 265 lb (120.2 kg)  01/19/19 265 lb (120.2 kg)     Other studies Reviewed: Additional studies/ records that were reviewed today include: . Review of the above records demonstrates:    Assessment and Plan:   1. CAD with angina:  Chest pain improved on higher dose of Toprol. Nuclear stress test with no ischemia last week. Continue ASA, Plavix, statin, beta blocker and Norvasc.     2. HTN: BP is controlled. No changes.   3. HLD:  Lipids followed in primary care. LDL 67 in January 2020. Will continue statin.   4. PVCs/PACs/SVT: He has had recent palpitations. He has worn a cardiac monitor which is pending today. I will review this and let him know what it shows when the data is available. Continue beta blocker.   Current medicines are reviewed at length with the patient today.  The patient does not have concerns regarding medicines.  The following changes have been made:  Added Norvasc.   Labs/ tests ordered today include:   No orders of the defined types were placed in this encounter.   Disposition:   FU with me in 6 months.    Signed, Lauree Chandler, MD 02/23/2019 4:39 PM    [facility] [place] [address], [place], [place]  [address] Phone: [phone]; Fax: [phone]

## 2019-02-23 NOTE — Telephone Encounter (Signed)
Called pt re: stress test results, left a message for him to call back.

## 2019-02-23 NOTE — Patient Instructions (Signed)
Medication Instructions:  No changes If you need a refill on your cardiac medications before your next appointment, please call your pharmacy.   Lab work: none If you have labs (blood work) drawn today and your tests are completely normal, you will receive your results only by: [name] Kitchen MyChart Message (if you have MyChart) OR . A paper copy in the mail If you have any lab test that is abnormal or we need to change your treatment, we will call you to review the results.  Testing/Procedures: none  Follow-Up: At [facility], you and your health needs are our priority.  As part of our continuing mission to provide you with exceptional heart care, we have created designated Provider Care Teams.  These Care Teams include your primary Cardiologist (physician) and Advanced Practice Providers (APPs -  Physician Assistants and Nurse Practitioners) who all work together to provide you with the care you need, when you need it. You will need a follow up appointment in 6 months.  Please call our office 2 months in advance to schedule this appointment.  You may see Lauree Chandler, MD or one of the following Advanced Practice Providers on your designated Care Team:   [place], PA-C Melina Copa, PA-C . Ermalinda Barrios, PA-C  Any Other Special Instructions Will Be Listed Below (If Applicable). We will contact you once your monitor report is reviewed by Dr. Angelena Form.

## 2019-02-23 NOTE — Telephone Encounter (Signed)
-----   Message from [place], [place] sent at 02/23/2019  9:58 AM EDT ----- Normal stress test without evidence of ischemia or infraction.

## 2019-02-24 ENCOUNTER — Telehealth: Payer: Self-pay

## 2019-02-24 NOTE — Telephone Encounter (Signed)
Notes recorded by Frederik Schmidt, RN on 02/24/2019 at 3:16 PM EDT  The patient has been notified of the result and verbalized understanding. All questions (if any) were answered.  Frederik Schmidt, RN 02/24/2019 3:16 PM

## 2019-02-24 NOTE — Telephone Encounter (Signed)
-----   Message from Peter Blanks, MD sent at 02/24/2019 10:38 AM EDT ----- Can we let Peter Brooks know that his heart monitor showed several quick runs of SVT. We also saw this on his monitor in 2019. We can let him know that this is not dangerous. Continue the Toprol at 75 mg per day. No other changes. Thanks, chris

## 2019-02-24 NOTE — Telephone Encounter (Signed)
-----   Message from Burnell Blanks, MD sent at 02/24/2019 10:38 AM EDT ----- Can we let Mr. Loos know that his heart monitor showed several quick runs of SVT. We also saw this on his monitor in 2019. We can let him know that this is not dangerous. Continue the Toprol at 75 mg per day. No other changes. Thanks, chris

## 2019-02-24 NOTE — Telephone Encounter (Signed)
Notes recorded by Frederik Schmidt, RN on 02/24/2019 at 11:45 AM EDT  [id number] 10/6  ------

## 2019-04-06 DIAGNOSIS — H6121 Impacted cerumen, right ear: Secondary | ICD-10-CM | POA: Diagnosis not present

## 2019-04-06 DIAGNOSIS — H8112 Benign paroxysmal vertigo, left ear: Secondary | ICD-10-CM | POA: Diagnosis not present

## 2019-05-06 DIAGNOSIS — H26491 Other secondary cataract, right eye: Secondary | ICD-10-CM | POA: Diagnosis not present

## 2019-06-01 DIAGNOSIS — Z79899 Other long term (current) drug therapy: Secondary | ICD-10-CM | POA: Diagnosis not present

## 2019-06-01 DIAGNOSIS — E039 Hypothyroidism, unspecified: Secondary | ICD-10-CM | POA: Diagnosis not present

## 2019-06-01 DIAGNOSIS — E785 Hyperlipidemia, unspecified: Secondary | ICD-10-CM | POA: Diagnosis not present

## 2019-06-01 DIAGNOSIS — Z125 Encounter for screening for malignant neoplasm of prostate: Secondary | ICD-10-CM | POA: Diagnosis not present

## 2019-06-01 DIAGNOSIS — E1169 Type 2 diabetes mellitus with other specified complication: Secondary | ICD-10-CM | POA: Diagnosis not present

## 2019-06-09 DIAGNOSIS — Z0000 Encounter for general adult medical examination without abnormal findings: Secondary | ICD-10-CM | POA: Diagnosis not present

## 2019-06-09 DIAGNOSIS — F411 Generalized anxiety disorder: Secondary | ICD-10-CM | POA: Diagnosis not present

## 2019-06-09 DIAGNOSIS — I2510 Atherosclerotic heart disease of native coronary artery without angina pectoris: Secondary | ICD-10-CM | POA: Diagnosis not present

## 2019-06-09 DIAGNOSIS — E039 Hypothyroidism, unspecified: Secondary | ICD-10-CM | POA: Diagnosis not present

## 2019-06-09 DIAGNOSIS — R351 Nocturia: Secondary | ICD-10-CM | POA: Diagnosis not present

## 2019-06-09 DIAGNOSIS — E785 Hyperlipidemia, unspecified: Secondary | ICD-10-CM | POA: Diagnosis not present

## 2019-06-09 DIAGNOSIS — Z6837 Body mass index (BMI) 37.0-37.9, adult: Secondary | ICD-10-CM | POA: Diagnosis not present

## 2019-06-09 DIAGNOSIS — Z87448 Personal history of other diseases of urinary system: Secondary | ICD-10-CM | POA: Diagnosis not present

## 2019-06-09 DIAGNOSIS — K219 Gastro-esophageal reflux disease without esophagitis: Secondary | ICD-10-CM | POA: Diagnosis not present

## 2019-06-09 DIAGNOSIS — N401 Enlarged prostate with lower urinary tract symptoms: Secondary | ICD-10-CM | POA: Diagnosis not present

## 2019-06-09 DIAGNOSIS — E1169 Type 2 diabetes mellitus with other specified complication: Secondary | ICD-10-CM | POA: Diagnosis not present

## 2019-06-11 ENCOUNTER — Telehealth: Payer: Self-pay | Admitting: Cardiovascular Disease

## 2019-06-11 NOTE — Telephone Encounter (Signed)
Patient wants to get vaccine in [facility]. Requested county-covid letter be sent to him via [employer]. Sent.

## 2019-06-11 NOTE — Telephone Encounter (Signed)
Patient's wife, [place], is calling requesting a letter of stating that patient is eligible to receive Covid-19 vaccination. [place] states documentation is needed prior to scheduling vaccination appointment. Please call.

## 2019-06-23 DIAGNOSIS — M25562 Pain in left knee: Secondary | ICD-10-CM | POA: Diagnosis not present

## 2019-06-23 DIAGNOSIS — M1712 Unilateral primary osteoarthritis, left knee: Secondary | ICD-10-CM | POA: Diagnosis not present

## 2019-08-03 ENCOUNTER — Other Ambulatory Visit: Payer: Self-pay | Admitting: Cardiovascular Disease

## 2019-08-04 MED ORDER — METOPROLOL SUCCINATE ER 25 MG PO TB24: 75.0000 mg | ORAL_TABLET | Freq: Every day | ORAL | 6 refills | Status: DC

## 2019-08-24 ENCOUNTER — Other Ambulatory Visit: Payer: Self-pay | Admitting: Cardiovascular Disease

## 2019-09-21 ENCOUNTER — Other Ambulatory Visit: Payer: Self-pay

## 2019-09-21 ENCOUNTER — Encounter: Payer: Self-pay | Admitting: Cardiovascular Disease

## 2019-09-21 ENCOUNTER — Ambulatory Visit: Payer: PPO | Admitting: Cardiovascular Disease

## 2019-09-21 VITALS — BP 110/68 | HR 72 | Ht 74.0 in | Wt 272.0 lb

## 2019-09-21 DIAGNOSIS — I5033 Acute on chronic diastolic (congestive) heart failure: Secondary | ICD-10-CM

## 2019-09-21 DIAGNOSIS — I25119 Atherosclerotic heart disease of native coronary artery with unspecified angina pectoris: Secondary | ICD-10-CM

## 2019-09-21 DIAGNOSIS — E782 Mixed hyperlipidemia: Secondary | ICD-10-CM | POA: Diagnosis not present

## 2019-09-21 DIAGNOSIS — I10 Essential (primary) hypertension: Secondary | ICD-10-CM

## 2019-09-21 MED ORDER — FUROSEMIDE 40 MG PO TABS
40.0000 mg | ORAL_TABLET | Freq: Every day | ORAL | 3 refills | Status: DC
Start: 2019-09-21 — End: 2019-11-18

## 2019-09-21 NOTE — Progress Notes (Signed)
No chief complaint on file.  History of Present Illness: 74 yo male with history of CAD, HTN, HLD, GERD, SVT here today for cardiac follow up.  Cardiac cath June 2010 with non-obstructive CAD and LVEF 60%.  He was seen in January 2015 and had c/o heart racing, dizziness, fatigue. Stress myoview 07/27/13 without ischemia. Echo 07/27/13 with normal LV function, no significant valve issues. Event monitor in 2015 without any arrythmias. He called our office in July 2019 with c/o palpitations. Cardiac monitor August 2019 with PVCs, PACs, short run of SVT and 4 beat run of non-sustained VT. Toprol was started. I saw him in the office in October 2019 and he c/o progressive dyspnea and chest pain with exertion. Echo October 2019 with normal LV systolic function. No significant valve disease. Cardiac cath 03/11/18 with severe stenosis in the right PDA treated with a drug eluting stent. Mild non-obstructive disease in the LAD and Circumflex. He was seen via telemedicine visit 01/19/19 and had c/o chest pain with exertion. Nuclear stress test 02/20/19 with no evidence of ischemia. Cardiac monitor September 2020 with sinus, several short runs of SVT.   He is here today for follow up. The patient denies any chest pain, palpitations, lower extremity edema, orthopnea, PND, dizziness, near syncope or syncope. He describes 10 lb weight gain and dyspnea on exertion. He has lower extremity edema. .   Primary Care Physician: Street, Sharon Mt, MD  Past Medical History:  Diagnosis Date  . Anxiety   . Arthritis   . CAD (coronary artery disease)    nonobstructive CAD with 40% narrowing in the proximal LAD   . Cataract    removed bilaterally   . Chest pain   . Diabetes mellitus without complication ([place])   . Dizziness   . GERD (gastroesophageal reflux disease)   . Hyperlipidemia   . Hypertension   . Hypothyroidism   . Lumbar herniated disc     Past Surgical History:  Procedure Laterality Date  . CARDIAC  CATHETERIZATION  11/11/2008  . CATARACT EXTRACTION Right   . CATARACT EXTRACTION Left   . COLONOSCOPY    . CORONARY STENT INTERVENTION N/A 03/11/2018   Procedure: CORONARY STENT INTERVENTION;  Surgeon: Martinique, Peter M, MD;  Location: [facility];  Service: Cardiovascular;  Laterality: N/A;  . KNEE ARTHROSCOPY Bilateral 2010 Right  . POLYPECTOMY    . RIGHT/LEFT HEART CATH AND CORONARY ANGIOGRAPHY N/A 03/11/2018   Procedure: RIGHT/LEFT HEART CATH AND CORONARY ANGIOGRAPHY;  Surgeon: Martinique, Peter M, MD;  Location: [facility];  Service: Cardiovascular;  Laterality: N/A;    Current Outpatient Medications  Medication Sig Dispense Refill  . amLODipine (NORVASC) 5 MG tablet Take 1 tablet by mouth once daily 90 tablet 1  . Ascorbic Acid (VITAMIN C) 1000 MG tablet Take 1,000 mg by mouth daily.    [name] Kitchen aspirin EC 81 MG tablet Take 81 mg by mouth every evening.     . Blood Glucose Monitoring Suppl (ONE TOUCH ULTRA 2) w/Device KIT See admin instructions.    . Calcium Carbonate-Vitamin D (CALTRATE 600+D) 600-400 MG-UNIT per tablet Take 1 tablet by mouth daily.    . clopidogrel (PLAVIX) 75 MG tablet Take 1 tablet by mouth once daily 90 tablet 1  . Coenzyme Q10 (COQ-10) 400 MG CAPS Take 400 mg by mouth daily.    . diazepam (VALIUM) 2 MG tablet Take 1 tablet (2 mg total) by mouth every 12 (twelve) hours as needed for anxiety. [place]  tablet 1  . docusate sodium (COLACE) 100 MG capsule Take 100 mg by mouth daily as needed for mild constipation.    . finasteride (PROSCAR) 5 MG tablet Take 5 mg by mouth daily.    [name] Kitchen levothyroxine (SYNTHROID, LEVOTHROID) 50 MCG tablet Take 50 mcg by mouth daily.    . metFORMIN (GLUCOPHAGE-XR) 500 MG 24 hr tablet Take 500 mg by mouth 2 (two) times daily.    . metoprolol succinate (TOPROL XL) 25 MG 24 hr tablet Take 3 tablets (75 mg total) by mouth daily. 90 tablet 6  . Multiple Vitamin (MULITIVITAMIN WITH MINERALS) TABS Take 1 tablet by mouth daily.    . Multiple  Vitamins-Minerals (PRESERVISION AREDS 2) CAPS Take 1 capsule by mouth 2 (two) times daily.     . nitroGLYCERIN (NITROSTAT) 0.4 MG SL tablet Place 1 tablet (0.4 mg total) under the tongue every 5 (five) minutes as needed. 25 tablet 2  . ONETOUCH ULTRA test strip USE 1 STRIP TO CHECK GLUCOSE ONCE DAILY    . Probiotic Product (PROBIOTIC DAILY PO) Take 1 tablet by mouth daily.    . rosuvastatin (CRESTOR) 5 MG tablet Take 5 mg by mouth every evening.   0  . tamsulosin (FLOMAX) 0.4 MG CAPS capsule TAKE 1 CAPSULE BY MOUTH EVERY DAY AT BEDTIME    . furosemide (LASIX) 40 MG tablet Take 1 tablet (40 mg total) by mouth daily. 90 tablet 3   No current facility-administered medications for this visit.    No Known Allergies  Social History   Socioeconomic History  . Marital status: Married    Spouse name: Not on file  . Number of children: Not on file  . Years of education: Not on file  . Highest education level: Not on file  Occupational History  . Not on file  Tobacco Use  . Smoking status: Never Smoker  . Smokeless tobacco: Never Used  Substance and Sexual Activity  . Alcohol use: No  . Drug use: No  . Sexual activity: Yes    Comment: married  Other Topics Concern  . Not on file  Social History Narrative  . Not on file   Social Determinants of Health   Financial Resource Strain:   . Difficulty of Paying Living Expenses:   Food Insecurity:   . Worried About Charity fundraiser in the Last Year:   . Arboriculturist in the Last Year:   Transportation Needs:   . Film/video editor (Medical):   [name] Kitchen Lack of Transportation (Non-Medical):   Physical Activity:   . Days of Exercise per Week:   . Minutes of Exercise per Session:   Stress:   . Feeling of Stress :   Social Connections:   . Frequency of Communication with Friends and Family:   . Frequency of Social Gatherings with Friends and Family:   . Attends Religious Services:   . Active Member of Clubs or Organizations:   .  Attends Archivist Meetings:   [name] Kitchen Marital Status:   Intimate Partner Violence:   . Fear of Current or Ex-Partner:   . Emotionally Abused:   [name] Kitchen Physically Abused:   . Sexually Abused:     Family History  Problem Relation Age of Onset  . Colon cancer Paternal Uncle 68  . Coronary artery disease Father        with CABG valve replacement in his 69's  . Heart attack Mother        in  her 60's  . Esophageal cancer Neg Hx   . Rectal cancer Neg Hx   . Stomach cancer Neg Hx   . Colon polyps Neg Hx     Review of Systems:  As stated in the HPI and otherwise negative.   BP 110/68   Pulse 72   Ht '6\' 2"'  (1.88 m)   Wt 272 lb (123.4 kg)   SpO2 94%   BMI 34.92 kg/m   Physical Examination:  General: Well developed, well nourished, NAD  HEENT: OP clear, mucus membranes moist  SKIN: warm, dry. No rashes. Neuro: No focal deficits  Musculoskeletal: Muscle strength 5/5 all ext  Psychiatric: Mood and affect normal  Neck: No JVD, no carotid bruits, no thyromegaly, no lymphadenopathy.  Lungs:Clear bilaterally, no wheezes, rhonci, crackles Cardiovascular: Regular rate and rhythm. No murmurs, gallops or rubs. Abdomen:Soft. Bowel sounds present. Non-tender.  Extremities: No lower extremity edema. Pulses are 2 + in the bilateral DP/PT.  Echo October 2019: Left ventricle: The cavity size was normal. There was moderate   concentric hypertrophy. Systolic function was normal. The   estimated ejection fraction was in the range of 60% to 65%. Wall   motion was normal; there were no regional wall motion   abnormalities. Doppler parameters are consistent with abnormal   left ventricular relaxation (grade 1 diastolic dysfunction). - Aortic valve: Sclerosis without stenosis. There was no   regurgitation. - Left atrium: The atrium was mildly dilated. - Tricuspid valve: There was mild regurgitation. - Pulmonary arteries: Systolic pressure was mildly increased. PA   peak pressure: 35 mm Hg (S).   Impressions:  - There has been no significant difference since te prior study on   07/27/2013.  EKG:  EKG is not ordered today. The ekg ordered today demonstrates   Recent Labs: No results found for requested labs within last 8760 hours.   Lipid Panel Followed in primary care   Wt Readings from Last 3 Encounters:  09/21/19 272 lb (123.4 kg)  02/23/19 276 lb (125.2 kg)  02/20/19 265 lb (120.2 kg)     Other studies Reviewed: Additional studies/ records that were reviewed today include: . Review of the above records demonstrates:    Assessment and Plan:   1. CAD with angina: No chest pain. Nuclear stress test in 2020 with no ischemia. Continue ASA, Plavix, statin and beta blocker.     2. HTN: BP is well controlled. Continue current therapy  3. HLD:  Lipids followed in primary care. LDL 58 in January 2021. Continue statin.   4. PVCs/PACs/SVT:   No palpitations.  Continue Toprol.   5. Acute on chronic diastolic CHF: Will repeat echo. Will start Lasix 40 mg daily. Will check BMET at return visit. Creatinine normal in January 2021 in primary care.   Current medicines are reviewed at length with the patient today.  The patient does not have concerns regarding medicines.  The following changes have been made:    Labs/ tests ordered today include:   Orders Placed This Encounter  Procedures  . ECHOCARDIOGRAM COMPLETE    Disposition:   Follow up with care team APP in 3-4 weeks.     Signed, Lauree Chandler, MD 09/21/2019 11:07 AM    [facility] [place], [place], [place]  [address] Phone: [phone]; Fax: [phone]

## 2019-09-21 NOTE — Patient Instructions (Signed)
Medication Instructions:  Your physician has recommended you make the following change in your medication:  1.) furosemide (Lasix) 40 mg -take one tablet daily  *If you need a refill on your cardiac medications before your next appointment, please call your pharmacy*   Lab Work: none If you have labs (blood work) drawn today and your tests are completely normal, you will receive your results only by: [name] Kitchen MyChart Message (if you have MyChart) OR . A paper copy in the mail If you have any lab test that is abnormal or we need to change your treatment, we will call you to review the results.   Testing/Procedures: Your physician has requested that you have an echocardiogram. Echocardiography is a painless test that uses sound waves to create images of your heart. It provides your doctor with information about the size and shape of your heart and how well your heart's chambers and valves are working. This procedure takes approximately one hour. There are no restrictions for this procedure.   Follow-Up: At [facility], you and your health needs are our priority.  As part of our continuing mission to provide you with exceptional heart care, we have created designated Provider Care Teams.  These Care Teams include your primary Cardiologist (physician) and Advanced Practice Providers (APPs -  Physician Assistants and Nurse Practitioners) who all work together to provide you with the care you need, when you need it.  We recommend signing up for the patient portal called "MyChart".  Sign up information is provided on this After Visit Summary.  MyChart is used to connect with patients for Virtual Visits (Telemedicine).  Patients are able to view lab/test results, encounter notes, upcoming appointments, etc.  Non-urgent messages can be sent to your provider as well.   To learn more about what you can do with MyChart, go to [email or web address].    Your next appointment:   3-4 week(s)  The format  for your next appointment:   In Person  Provider:   You may see one of the following Advanced Practice Providers on your designated Care Team:    Melina Copa, PA-C  Ermalinda Barrios, PA-C    Other Instructions

## 2019-09-24 DIAGNOSIS — M1712 Unilateral primary osteoarthritis, left knee: Secondary | ICD-10-CM | POA: Diagnosis not present

## 2019-09-24 DIAGNOSIS — M25562 Pain in left knee: Secondary | ICD-10-CM | POA: Diagnosis not present

## 2019-09-30 ENCOUNTER — Telehealth: Payer: Self-pay | Admitting: Cardiovascular Disease

## 2019-09-30 NOTE — Telephone Encounter (Signed)
New message  Pt c/o medication issue:  1. Name of Medication:  clopidogrel (PLAVIX) 75 MG tablet furosemide (LASIX) 40 MG tablet  2. How are you currently taking this medication (dosage and times per day)? As directed  3. Are you having a reaction (difficulty breathing--STAT)?  4. What is your medication issue? Patient states that he was told to call and update the office in a week after using Plavix/Lasix. Patient states that he only lost 3 lbs in a week, still having swelling in the ankles and feet, and shortness of breath. Please call back to advise.

## 2019-09-30 NOTE — Telephone Encounter (Signed)
I spoke with pt. Weight on 5/4 was 273 lbs. Weight today is 270 lbs.  Loss was gradual after starting lasix. Minimal decrease in swelling in feet and ankles. Continues to have shortness of breath. States initially there was some improvement after starting lasix. Shortness of breath occurs with activity such as walking his dog. Worse today due to humidity. 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 No chest pain. States he was told to call in a week or so with an update.

## 2019-10-01 NOTE — Telephone Encounter (Signed)
Left message to call office

## 2019-10-01 NOTE — Telephone Encounter (Signed)
Follow up ° ° ° °Patient returning call.  Please call °

## 2019-10-01 NOTE — Telephone Encounter (Signed)
I spoke with patient and gave him instructions from Dr Angelena Form. Patient aware to go back to daily dose of lasix once weight back to baseline and swelling is gone.  Patient has enough lasix to take twice daily for now.  He is seeing Melina Copa, PA on June 3,2021.  I told patient on going dose of lasix could be determined at this visit and new prescription sent to his pharmacy at that time

## 2019-10-01 NOTE — Telephone Encounter (Signed)
If he is still having dyspnea and his weight is still above baseline, I would have him increase the Lasix to 40 mg po BID until his weight comes down to baseline and the swelling is gone. Peter Brooks

## 2019-10-12 ENCOUNTER — Other Ambulatory Visit: Payer: Self-pay

## 2019-10-12 ENCOUNTER — Ambulatory Visit (HOSPITAL_COMMUNITY): Payer: PPO | Attending: Cardiology

## 2019-10-12 DIAGNOSIS — I10 Essential (primary) hypertension: Secondary | ICD-10-CM | POA: Diagnosis not present

## 2019-10-12 DIAGNOSIS — I25119 Atherosclerotic heart disease of native coronary artery with unspecified angina pectoris: Secondary | ICD-10-CM | POA: Insufficient documentation

## 2019-10-12 DIAGNOSIS — I5033 Acute on chronic diastolic (congestive) heart failure: Secondary | ICD-10-CM | POA: Insufficient documentation

## 2019-10-12 DIAGNOSIS — E782 Mixed hyperlipidemia: Secondary | ICD-10-CM | POA: Diagnosis not present

## 2019-10-22 ENCOUNTER — Ambulatory Visit: Payer: PPO | Admitting: Physician Assistant

## 2019-11-09 ENCOUNTER — Telehealth: Payer: Self-pay | Admitting: Cardiovascular Disease

## 2019-11-09 DIAGNOSIS — I10 Essential (primary) hypertension: Secondary | ICD-10-CM

## 2019-11-09 NOTE — Telephone Encounter (Signed)
Follow Up:    Pt is returning your call from this morning. 

## 2019-11-09 NOTE — Telephone Encounter (Signed)
Called pt and pt states that he was told to take 2 tablets of Furosemide 40 mg tablet until swelling was gone. Pt states that he is still having swelling and that he only has 4 tablets left and pt has an appt on 11/18/19 and pt would like enough medication sent to pharmacy until pt can come in for his appt. Please address

## 2019-11-09 NOTE — Telephone Encounter (Signed)
*  STAT* If patient is at the pharmacy, call can be transferred to refill team.   1. Which medications need to be refilled? (please list name of each medication and dose if known) furosemide (LASIX)  - Take 2 tablets (80 mg total) by mouth daily  2. Which pharmacy/location (including street and city if local pharmacy) is medication to be sent to? [facility] [place], [place]  3. Do they need a 30 day or 90 day supply? 30 day supply  Patient states he has 4 tablets remaining.

## 2019-11-09 NOTE — Telephone Encounter (Signed)
Thanks

## 2019-11-09 NOTE — Telephone Encounter (Signed)
Spoke with Peter Brooks He continues on lasix 40 mg BID because he still has BLE swelling.  Mostly pedal/ankle, legs swollen at times when they are dependent for long periods.     Has been on this dose of lasix since he called in May.  Had echo w normal EF, was to have f/u 10/22/19 but had to cancel and now his follow up is 11/18/19.  He will come tomorrow am for BMET.  He is not on potassium.    Pt aware after his labs are reviewed, Dr. Angelena Form will decide on whether he should continue on Lasix 40 mg BID or adjust.  His weight has decreased 14 pounds, a combination of intentional weight loss and fluid loss. Pt grateful for assistance provided.

## 2019-11-09 NOTE — Telephone Encounter (Signed)
Left messages on home and mobile numbers to call back. ?

## 2019-11-10 ENCOUNTER — Other Ambulatory Visit: Payer: Self-pay

## 2019-11-10 ENCOUNTER — Other Ambulatory Visit: Payer: PPO

## 2019-11-10 DIAGNOSIS — I10 Essential (primary) hypertension: Secondary | ICD-10-CM | POA: Diagnosis not present

## 2019-11-10 LAB — BASIC METABOLIC PANEL
BUN/Creatinine Ratio: 13 (ref 10–24)
BUN: 14 mg/dL (ref 8–27)
CO2: 24 mmol/L (ref 20–29)
Calcium: 9.3 mg/dL (ref 8.6–10.2)
Chloride: 104 mmol/L (ref 96–106)
Creatinine, Ser: 1.1 mg/dL (ref 0.76–1.27)
GFR calc Af Amer: 76 mL/min/{1.73_m2} (ref >59)
GFR calc non Af Amer: 65 mL/min/{1.73_m2} (ref >59)
Glucose: 155 mg/dL — ABNORMAL HIGH (ref 65–99)
Potassium: 4.6 mmol/L (ref 3.5–5.2)
Sodium: 142 mmol/L (ref 134–144)

## 2019-11-10 NOTE — Telephone Encounter (Signed)
Lab ordered.

## 2019-11-16 NOTE — Progress Notes (Signed)
Cardiology Office Note    Date:  11/18/2019   ID:  Peter Brooks, DOB 1945/10/28, MRN [id number]  PCP:  Street, Peter Mt, MD  Cardiologist: Peter Chandler, MD EPS: None  Chief Complaint  Patient presents with  . Follow-up    History of Present Illness:  Peter Brooks is a 74 y.o. male with history of CAD nonobstructive on cath in 2010 LVEF 60%, repeat cath for chest pain shortness of breath 03/11/2018 DES for severe stenosis in the right PDA, mild nonobstructive  disease in the LAD and circumflex.  No ischemia on NST 2020.  Also have history of SVT, NSVT PVCs PACs treated with Toprol, hypertension, HLD.   Patient saw Dr. Angelena Form 09/21/2019 at which time he was complaining of shortness of breath and leg edema felt to be in acute on chronic diastolic CHF.  Lasix 40 mg daily was started.  Echo 10/12/2019 normal LVEF 60 to 65% with grade 1 DD.  Patient comes in today. Has lost 12 lbs by his scales and 5 lbs by ours but it may not have been documented properly according to patient.  Still short of breath with walking his dog. Ankles still swell at the end of the day. Works as a Advertising account executive. Orpha Bur out a lot and may be getting extra salt in his diet.  Still having the irregular heartbeats fast heartbeats especially at night.   Past Medical History:  Diagnosis Date  . Anxiety   . Arthritis   . CAD (coronary artery disease)    nonobstructive CAD with 40% narrowing in the proximal LAD   . Cataract    removed bilaterally   . Chest pain   . Diabetes mellitus without complication ([place])   . Dizziness   . GERD (gastroesophageal reflux disease)   . Hyperlipidemia   . Hypertension   . Hypothyroidism   . Lumbar herniated disc     Past Surgical History:  Procedure Laterality Date  . CARDIAC CATHETERIZATION  11/11/2008  . CATARACT EXTRACTION Right   . CATARACT EXTRACTION Left   . COLONOSCOPY    . CORONARY STENT INTERVENTION N/A 03/11/2018   Procedure:  CORONARY STENT INTERVENTION;  Surgeon: Martinique, Peter M, MD;  Location: [facility];  Service: Cardiovascular;  Laterality: N/A;  . KNEE ARTHROSCOPY Bilateral 2010 Right  . POLYPECTOMY    . RIGHT/LEFT HEART CATH AND CORONARY ANGIOGRAPHY N/A 03/11/2018   Procedure: RIGHT/LEFT HEART CATH AND CORONARY ANGIOGRAPHY;  Surgeon: Martinique, Peter M, MD;  Location: [facility];  Service: Cardiovascular;  Laterality: N/A;    Current Medications: Current Meds  Medication Sig  . amLODipine (NORVASC) 5 MG tablet Take 1 tablet by mouth once daily  . Ascorbic Acid (VITAMIN C) 1000 MG tablet Take 1,000 mg by mouth daily.  [name] Kitchen aspirin EC 81 MG tablet Take 81 mg by mouth every evening.   . Blood Glucose Monitoring Suppl (ONE TOUCH ULTRA 2) w/Device KIT See admin instructions.  . Calcium Carbonate-Vitamin D (CALTRATE 600+D) 600-400 MG-UNIT per tablet Take 1 tablet by mouth daily.  . clopidogrel (PLAVIX) 75 MG tablet Take 1 tablet by mouth once daily  . Coenzyme Q10 (COQ-10) 400 MG CAPS Take 400 mg by mouth daily.  . diazepam (VALIUM) 2 MG tablet Take 1 tablet (2 mg total) by mouth every 12 (twelve) hours as needed for anxiety.  . docusate sodium (COLACE) 100 MG capsule Take 100 mg by mouth daily as needed for mild constipation.  [name] Kitchen  finasteride (PROSCAR) 5 MG tablet Take 5 mg by mouth daily.  . furosemide (LASIX) 40 MG tablet Take 1 tablet (40 mg total) by mouth daily. May take 1 extra tablet daily as needed for weight gain or swelling  . levothyroxine (SYNTHROID, LEVOTHROID) 50 MCG tablet Take 50 mcg by mouth daily.  . metFORMIN (GLUCOPHAGE-XR) 500 MG 24 hr tablet Take 500 mg by mouth 2 (two) times daily.  . metoprolol succinate (TOPROL XL) 100 MG 24 hr tablet Take 1 tablet (100 mg total) by mouth daily.  . Multiple Vitamin (MULITIVITAMIN WITH MINERALS) TABS Take 1 tablet by mouth daily.  . Multiple Vitamins-Minerals (PRESERVISION AREDS 2) CAPS Take 1 capsule by mouth 2 (two) times daily.   .  nitroGLYCERIN (NITROSTAT) 0.4 MG SL tablet Place 1 tablet (0.4 mg total) under the tongue every 5 (five) minutes as needed.  Glory Rosebush ULTRA test strip USE 1 STRIP TO CHECK GLUCOSE ONCE DAILY  . Probiotic Product (PROBIOTIC DAILY PO) Take 1 tablet by mouth daily.  . rosuvastatin (CRESTOR) 5 MG tablet Take 5 mg by mouth every evening.   . [DISCONTINUED] furosemide (LASIX) 40 MG tablet Take 1 tablet (40 mg total) by mouth daily.  . [DISCONTINUED] metoprolol succinate (TOPROL XL) 25 MG 24 hr tablet Take 3 tablets (75 mg total) by mouth daily.     Allergies:   Patient has no known allergies.   Social History   Socioeconomic History  . Marital status: Married    Spouse name: Not on file  . Number of children: Not on file  . Years of education: Not on file  . Highest education level: Not on file  Occupational History  . Not on file  Tobacco Use  . Smoking status: Never Smoker  . Smokeless tobacco: Never Used  Vaping Use  . Vaping Use: Never used  Substance and Sexual Activity  . Alcohol use: No  . Drug use: No  . Sexual activity: Yes    Comment: married  Other Topics Concern  . Not on file  Social History Narrative  . Not on file   Social Determinants of Health   Financial Resource Strain:   . Difficulty of Paying Living Expenses:   Food Insecurity:   . Worried About Charity fundraiser in the Last Year:   . Arboriculturist in the Last Year:   Transportation Needs:   . Film/video editor (Medical):   [name] Kitchen Lack of Transportation (Non-Medical):   Physical Activity:   . Days of Exercise per Week:   . Minutes of Exercise per Session:   Stress:   . Feeling of Stress :   Social Connections:   . Frequency of Communication with Friends and Family:   . Frequency of Social Gatherings with Friends and Family:   . Attends Religious Services:   . Active Member of Clubs or Organizations:   . Attends Archivist Meetings:   [name] Kitchen Marital Status:      Family History:  The  patient's family history includes Colon cancer (age of onset: 73) in his paternal uncle; Coronary artery disease in his father; Heart attack in his mother.   ROS:   Please see the history of present illness.    ROS All other systems reviewed and are negative.   PHYSICAL EXAM:   VS:  BP 112/68   Pulse 69   Ht '6\' 2"'$  (1.88 Brooks)   Wt 267 lb (121.1 kg)   SpO2 94%  BMI 34.28 kg/Brooks   Physical Exam  GEN: Obese, in no acute distress  Neck: no JVD, carotid bruits, or masses Cardiac:RRR; no murmurs, rubs, or gallops  Respiratory:  clear to auscultation bilaterally, normal work of breathing GI: soft, nontender, nondistended, + BS Ext: without cyanosis, clubbing, or edema, Good distal pulses bilaterally Neuro:  Alert and Oriented x 3 Psych: euthymic mood, full affect  Wt Readings from Last 3 Encounters:  11/18/19 267 lb (121.1 kg)  09/21/19 272 lb (123.4 kg)  02/23/19 276 lb (125.2 kg)      Studies/Labs Reviewed:   EKG:  EKG is not ordered today.    Recent Labs: 11/10/2019: BUN 14; Creatinine, Ser 1.10; Potassium 4.6; Sodium 142   Lipid Panel    Component Value Date/Time   CHOL 129 08/09/2011 0710   TRIG 104 08/09/2011 0710   HDL 36 (L) 08/09/2011 0710   CHOLHDL 3.6 08/09/2011 0710   VLDL 21 08/09/2011 0710   LDLCALC 72 08/09/2011 0710    Additional studies/ records that were reviewed today include:  2D echo 5/24/2021IMPRESSIONS     1. Left ventricular ejection fraction, by estimation, is 60 to 65%. The  left ventricle has normal function. The left ventricle has no regional  wall motion abnormalities. There is mild concentric left ventricular  hypertrophy. Left ventricular diastolic  parameters are consistent with Grade I diastolic dysfunction (impaired  relaxation).   2. Right ventricular systolic function is normal. The right ventricular  size is normal. There is normal pulmonary artery systolic pressure. The  estimated right ventricular systolic pressure is 38.4 mmHg.     3. The mitral valve is normal in structure. No evidence of mitral valve  regurgitation. No evidence of mitral stenosis.   4. The aortic valve is tricuspid. Aortic valve regurgitation is not  visualized. Mild to moderate aortic valve sclerosis/calcification is  present, without any evidence of aortic stenosis.   5. The inferior vena cava is normal in size with greater than 50%  respiratory variability, suggesting right atrial pressure of 3 mmHg.   FINDINGS   Left Ventricle: Left ventricular ejection fraction, by estimation, is 60  to 65%. The left ventricle has normal function. The left ventricle has no  regional wall motion abnormalities. The left ventricular internal cavity  size was normal in size. There is   mild concentric left ventricular hypertrophy. Left ventricular diastolic  parameters are consistent with Grade I diastolic dysfunction (impaired  relaxation). Normal left ventricular filling pressure.   Right Ventricle: The right ventricular size is normal. No increase in  right ventricular wall thickness. Right ventricular systolic function is  normal. There is normal pulmonary artery systolic pressure. The tricuspid  regurgitant velocity is 2.65 Brooks/s, and   with an assumed right atrial pressure of 3 mmHg, the estimated right  ventricular systolic pressure is 66.5 mmHg.   Left Atrium: Left atrial size was normal in size.   Right Atrium: Right atrial size was normal in size.   Pericardium: There is no evidence of pericardial effusion.   Mitral Valve: The mitral valve is normal in structure. Normal mobility of  the mitral valve leaflets. No evidence of mitral valve regurgitation. No  evidence of mitral valve stenosis.   Tricuspid Valve: The tricuspid valve is normal in structure. Tricuspid  valve regurgitation is trivial. No evidence of tricuspid stenosis.   Aortic Valve: The aortic valve is tricuspid. Aortic valve regurgitation is  not visualized. Mild to moderate  aortic valve sclerosis/calcification is  present,  without any evidence of aortic stenosis.   Pulmonic Valve: The pulmonic valve was normal in structure. Pulmonic valve  regurgitation is not visualized. No evidence of pulmonic stenosis.   Aorta: The aortic root is normal in size and structure.   Venous: The inferior vena cava is normal in size with greater than 50%  respiratory variability, suggesting right atrial pressure of 3 mmHg.   IAS/Shunts: No atrial level shunt detected by color flow Doppler.   Cath: 03/11/18    Ost RPDA to RPDA lesion is 95% stenosed.  A drug-eluting stent was successfully placed using a STENT SIERRA 2.25 X 12 MM.  Post intervention, there is a 0% residual stenosis.  Prox LAD to Mid LAD lesion is 40% stenosed.  Ost 2nd Diag to 2nd Diag lesion is 60% stenosed.  Prox Cx lesion is 40% stenosed.  LV end diastolic pressure is normal.   1. Single vessel obstructive CAD involving the PDA. Moderate calcific disease in the LAD and LCx. 2. Normal LV filling pressures 3. Normal right heart pressures and cardiac output 4. Successful PCI of the PDA with DES.   Plan: patient is a candidate for same day discharge. Hold metformin for 48 hours.   Recommend uninterrupted dual antiplatelet therapy with Aspirin '81mg'$  daily and Clopidogrel '75mg'$  daily for a minimum of 6 months (stable ischemic heart disease - Class I recommendation). _____________   NST 02/2019  Nuclear stress EF: 60%.  There was no ST segment deviation noted during stress.  The study is normal.  This is a low risk study.  The left ventricular ejection fraction is normal (55-65%).   Normal stress nuclear study with no ischemia or infarction.  Gated ejection fraction 60% with normal wall motion.     ASSESSMENT:    1. Coronary artery disease involving native heart without angina pectoris, unspecified vessel or lesion type   2. Chronic diastolic CHF (congestive heart failure) ([place])   3.  Essential hypertension   4. Hyperlipidemia, unspecified hyperlipidemia type   5. NSVT (nonsustained ventricular tachycardia) ([facility])   6. SVT (supraventricular tachycardia) ([facility])      PLAN:  In order of problems listed above:  CAD status post DES to the PDA 03/11/2018 with moderate 40% proximal to mid LAD 40% circumflex normal NST 02/2019-no angina but ongoing dyspnea on exertion.  See below  Chronic diastolic CHF with recent exacerbation started on Lasix.  2D echo with normal LVEF grade 1 DD.  Has lost 12 pounds on his scales.  Has dropped Lasix back to 40 mg once daily and does add K-Dur 20 daily, bmet was normal 10/21/19.  Can take extra Lasix as needed for weight gain.  2 g sodium diet plan.  Continues to note dyspnea on exertion.  His heart rate still races and skips.  Will increase Toprol-XL to 100 mg once daily.  If he continues to have dyspnea on exertion consider repeat stress test treated.  Weight could certainly be contributing.  Essential hypertension controlled  Hyperlipidemia LDL 53 06/01/2019 on Crestor  NSVT/SVT treated with beta-blocker but continued palpitations and heart racing and skipping.  Will increase Toprol-XL to 100 mg once daily.    Medication Adjustments/Labs and Tests Ordered: Current medicines are reviewed at length with the patient today.  Concerns regarding medicines are outlined above.  Medication changes, Labs and Tests ordered today are listed in the Patient Instructions below. There are no Patient Instructions on file for this visit.   Sumner Boast, PA-C  11/18/2019 10:11  AM    [facility] [place], [place], [place]  [address] Phone: [phone]; Fax: [phone]

## 2019-11-18 ENCOUNTER — Ambulatory Visit: Payer: PPO | Admitting: Physician Assistant

## 2019-11-18 ENCOUNTER — Encounter: Payer: Self-pay | Admitting: Physician Assistant

## 2019-11-18 ENCOUNTER — Other Ambulatory Visit: Payer: Self-pay

## 2019-11-18 VITALS — BP 112/68 | HR 69 | Ht 74.0 in | Wt 267.0 lb

## 2019-11-18 DIAGNOSIS — I5032 Chronic diastolic (congestive) heart failure: Secondary | ICD-10-CM | POA: Diagnosis not present

## 2019-11-18 DIAGNOSIS — E785 Hyperlipidemia, unspecified: Secondary | ICD-10-CM | POA: Diagnosis not present

## 2019-11-18 DIAGNOSIS — I472 Ventricular tachycardia: Secondary | ICD-10-CM | POA: Diagnosis not present

## 2019-11-18 DIAGNOSIS — I2510 Atherosclerotic heart disease of native coronary artery without angina pectoris: Secondary | ICD-10-CM

## 2019-11-18 DIAGNOSIS — I4729 Other ventricular tachycardia: Secondary | ICD-10-CM

## 2019-11-18 DIAGNOSIS — I471 Supraventricular tachycardia: Secondary | ICD-10-CM

## 2019-11-18 DIAGNOSIS — I10 Essential (primary) hypertension: Secondary | ICD-10-CM

## 2019-11-18 MED ORDER — POTASSIUM CHLORIDE CRYS ER 20 MEQ PO TBCR
20.0000 meq | EXTENDED_RELEASE_TABLET | Freq: Every day | ORAL | 3 refills | Status: DC
Start: 2019-11-18 — End: 2020-03-17

## 2019-11-18 MED ORDER — METOPROLOL SUCCINATE ER 100 MG PO TB24: 100.0000 mg | ORAL_TABLET | Freq: Every day | ORAL | 3 refills | Status: DC

## 2019-11-18 MED ORDER — FUROSEMIDE 40 MG PO TABS: 40.0000 mg | ORAL_TABLET | Freq: Every day | ORAL | 3 refills | Status: DC

## 2019-11-18 NOTE — Patient Instructions (Signed)
Medication Instructions:  Your physician has recommended you make the following change in your medication:   1. INCREASE: metoprolol succinate (Toprol-XL) to 100 mg once a day  2. CHANGE: furosemide (lasix) 40 mg tablet: Take 1 tablet by mouth once a day. You may take 1 extra tablet daily AS NEEDED for swelling or weight gain of greater than 3 lbs in 24 hours or 5 lbs in 1 week  3. START: potassium chloride (k-dur) 20 mEq tablet: Take 1 tablet by mouth once a day  *If you need a refill on your cardiac medications before your next appointment, please call your pharmacy*   Lab Work: None  If you have labs (blood work) drawn today and your tests are completely normal, you will receive your results only by: [name] Kitchen MyChart Message (if you have MyChart) OR . A paper copy in the mail If you have any lab test that is abnormal or we need to change your treatment, we will call you to review the results.   Testing/Procedures: None  Follow-Up: Follow up with Dr. Angelena Form on 03/07/20 at 8:20 AM   Other Instructions Two Gram Sodium Diet 2000 mg  What is Sodium? Sodium is a mineral found naturally in many foods. The most significant source of sodium in the diet is table salt, which is about 40% sodium.  Processed, convenience, and preserved foods also contain a large amount of sodium.  The body needs only 500 mg of sodium daily to function,  A normal diet provides more than enough sodium even if you do not use salt.  Why Limit Sodium? A build up of sodium in the body can cause thirst, increased blood pressure, shortness of breath, and water retention.  Decreasing sodium in the diet can reduce edema and risk of heart attack or stroke associated with high blood pressure.  Keep in mind that there are many other factors involved in these health problems.  Heredity, obesity, lack of exercise, cigarette smoking, stress and what you eat all play a role.  General Guidelines:  Do not add salt at the table or  in cooking.  One teaspoon of salt contains over 2 grams of sodium.  Read food labels  Avoid processed and convenience foods  Ask your dietitian before eating any foods not dicussed in the menu planning guidelines  Consult your physician if you wish to use a salt substitute or a sodium containing medication such as antacids.  Limit milk and milk products to 16 oz (2 cups) per day.  Shopping Hints:  READ LABELS!! "Dietetic" does not necessarily mean low sodium.  Salt and other sodium ingredients are often added to foods during processing.   Menu Planning Guidelines Food Group Choose More Often Avoid  Beverages (see also the milk group All fruit juices, low-sodium, salt-free vegetables juices, low-sodium carbonated beverages Regular vegetable or tomato juices, commercially softened water used for drinking or cooking  Breads and Cereals Enriched white, wheat, rye and pumpernickel bread, hard rolls and dinner rolls; muffins, cornbread and waffles; most dry cereals, cooked cereal without added salt; unsalted crackers and breadsticks; low sodium or homemade bread crumbs Bread, rolls and crackers with salted tops; quick breads; instant hot cereals; pancakes; commercial bread stuffing; self-rising flower and biscuit mixes; regular bread crumbs or cracker crumbs  Desserts and Sweets Desserts and sweets mad with mild should be within allowance Instant pudding mixes and cake mixes  Fats Butter or margarine; vegetable oils; unsalted salad dressings, regular salad dressings limited to 1 Tbs;  light, sour and heavy cream Regular salad dressings containing bacon fat, bacon bits, and salt pork; snack dips made with instant soup mixes or processed cheese; salted nuts  Fruits Most fresh, frozen and canned fruits Fruits processed with salt or sodium-containing ingredient (some dried fruits are processed with sodium sulfites        Vegetables Fresh, frozen vegetables and low- sodium canned vegetables Regular  canned vegetables, sauerkraut, pickled vegetables, and others prepared in brine; frozen vegetables in sauces; vegetables seasoned with ham, bacon or salt pork  Condiments, Sauces, Miscellaneous  Salt substitute with physician's approval; pepper, herbs, spices; vinegar, lemon or lime juice; hot pepper sauce; garlic powder, onion powder, low sodium soy sauce (1 Tbs.); low sodium condiments (ketchup, chili sauce, mustard) in limited amounts (1 tsp.) fresh ground horseradish; unsalted tortilla chips, pretzels, potato chips, popcorn, salsa (1/4 cup) Any seasoning made with salt including garlic salt, celery salt, onion salt, and seasoned salt; sea salt, rock salt, kosher salt; meat tenderizers; monosodium glutamate; mustard, regular soy sauce, barbecue, sauce, chili sauce, teriyaki sauce, steak sauce, Worcestershire sauce, and most flavored vinegars; canned gravy and mixes; regular condiments; salted snack foods, olives, picles, relish, horseradish sauce, catsup   Food preparation: Try these seasonings Meats:    Pork Sage, onion Serve with applesauce  Chicken Poultry seasoning, thyme, parsley Serve with cranberry sauce  Lamb Curry powder, rosemary, garlic, thyme Serve with mint sauce or jelly  Veal Marjoram, basil Serve with current jelly, cranberry sauce  Beef Pepper, bay leaf Serve with dry mustard, unsalted chive butter  Fish [place] leaf, dill Serve with unsalted lemon butter, unsalted parsley butter  Vegetables:    Asparagus Lemon juice   Broccoli Lemon juice   Carrots Mustard dressing parsley, mint, nutmeg, glazed with unsalted butter and sugar   Green beans Marjoram, lemon juice, nutmeg,dill seed   Tomatoes Basil, marjoram, onion   Spice /blend for [employer]" 4 tsp ground thyme 1 tsp ground sage 3 tsp ground rosemary 4 tsp ground marjoram   Test your knowledge 1. A product that says "Salt Free" may still contain sodium. True or False 2. Garlic Powder and Hot Pepper Sauce an be used as  alternative seasonings.True or False 3. Processed foods have more sodium than fresh foods.  True or False 4. Canned Vegetables have less sodium than froze True or False  WAYS TO DECREASE YOUR SODIUM INTAKE 1. Avoid the use of added salt in cooking and at the table.  Table salt (and other prepared seasonings which contain salt) is probably one of the greatest sources of sodium in the diet.  Unsalted foods can gain flavor from the sweet, sour, and butter taste sensations of herbs and spices.  Instead of using salt for seasoning, try the following seasonings with the foods listed.  Remember: how you use them to enhance natural food flavors is limited only by your creativity... Allspice-Meat, fish, eggs, fruit, peas, red and yellow vegetables Almond Extract-Fruit baked goods Anise Seed-Sweet breads, fruit, carrots, beets, cottage cheese, cookies (tastes like licorice) Basil-Meat, fish, eggs, vegetables, rice, vegetables salads, soups, sauces Bay Leaf-Meat, fish, stews, poultry Burnet-Salad, vegetables (cucumber-like flavor) Caraway Seed-Bread, cookies, cottage cheese, meat, vegetables, cheese, rice Cardamon-Baked goods, fruit, soups Celery Powder or seed-Salads, salad dressings, sauces, meatloaf, soup, bread.Do not use  celery salt Chervil-Meats, salads, fish, eggs, vegetables, cottage cheese (parsley-like flavor) Chili Power-Meatloaf, chicken cheese, corn, eggplant, egg dishes Chives-Salads cottage cheese, egg dishes, soups, vegetables, sauces Cilantro-Salsa, casseroles Cinnamon-Baked goods, fruit, pork, lamb, chicken,  carrots Cloves-Fruit, baked goods, fish, pot roast, green beans, beets, carrots Coriander-Pastry, cookies, meat, salads, cheese (lemon-orange flavor) Cumin-Meatloaf, fish,cheese, eggs, cabbage,fruit pie (caraway flavor) [employer], fruit, eggs, fish, poultry, cottage cheese, vegetables Dill Seed-Meat, cottage cheese, poultry, vegetables, fish, salads, bread Fennel  Seed-Bread, cookies, apples, pork, eggs, fish, beets, cabbage, cheese, Licorice-like flavor Garlic-(buds or powder) Salads, meat, poultry, fish, bread, butter, vegetables, potatoes.Do not  use garlic salt Ginger-Fruit, vegetables, baked goods, meat, fish, poultry Horseradish Root-Meet, vegetables, butter Lemon Juice or Extract-Vegetables, fruit, tea, baked goods, fish salads Mace-Baked goods fruit, vegetables, fish, poultry (taste like nutmeg) Maple Extract-Syrups Marjoram-Meat, chicken, fish, vegetables, breads, green salads (taste like Sage) Mint-Tea, lamb, sherbet, vegetables, desserts, carrots, cabbage Mustard, Dry or Seed-Cheese, eggs, meats, vegetables, poultry Nutmeg-Baked goods, fruit, chicken, eggs, vegetables, desserts Onion Powder-Meat, fish, poultry, vegetables, cheese, eggs, bread, rice salads (Do not use   Onion salt) Orange Extract-Desserts, baked goods Oregano-Pasta, eggs, cheese, onions, pork, lamb, fish, chicken, vegetables, green salads Paprika-Meat, fish, poultry, eggs, cheese, vegetables Parsley Flakes-Butter, vegetables, meat fish, poultry, eggs, bread, salads (certain forms may   Contain sodium Pepper-Meat fish, poultry, vegetables, eggs Peppermint Extract-Desserts, baked goods Poppy Seed-Eggs, bread, cheese, fruit dressings, baked goods, noodles, vegetables, cottage  [employer], poultry, meat, fish, cauliflower, turnips,eggs bread Saffron-Rice, bread, veal, chicken, fish, eggs Sage-Meat, fish, poultry, onions, eggplant, tomateos, pork, stews Savory-Eggs, salads, poultry, meat, rice, vegetables, soups, pork Tarragon-Meat, poultry, fish, eggs, butter, vegetables (licorice-like flavor)  Thyme-Meat, poultry, fish, eggs, vegetables, (clover-like flavor), sauces, soups Tumeric-Salads, butter, eggs, fish, rice, vegetables (saffron-like flavor) Vanilla Extract-Baked goods, candy Vinegar-Salads, vegetables, meat marinades Walnut  Extract-baked goods, candy  2. Choose your Foods Wisely   The following is a list of foods to avoid which are high in sodium:  Meats-Avoid all smoked, canned, salt cured, dried and kosher meat and fish as well as Anchovies   [employer] meats:Bologna, Liverwurst, Pastrami Canned meat or fish  Marinated herring Caviar    Pepperoni Corned Beef   Pizza Dried chipped beef  Salami Frozen breaded fish or meat Salt pork Frankfurters or hot dogs  Sardines Gefilte fish   Sausage Ham (boiled ham, Proscuitto Smoked butt    spiced ham)   Spam      TV Dinners Vegetables Canned vegetables (Regular) Relish Canned mushrooms  Sauerkraut Olives    Tomato juice Pickles  Bakery and Dessert Products Canned puddings  Cream pies Cheesecake   Decorated cakes Cookies  Beverages/Juices Tomato juice, regular  Gatorade   V-8 vegetable juice, regular  Breads and Cereals Biscuit mixes   Salted potato chips, corn chips, pretzels Bread stuffing mixes  Salted crackers and rolls Pancake and waffle mixes Self-rising flour  Seasonings Accent    Meat sauces Barbecue sauce  Meat tenderizer Catsup    Monosodium glutamate (MSG) Celery salt   Onion salt Chili sauce   Prepared mustard Garlic salt   Salt, seasoned salt, sea salt Gravy mixes   Soy sauce Horseradish   Steak sauce Ketchup   Tartar sauce Lite salt    Teriyaki sauce Marinade mixes   Worcestershire sauce  Others Baking powder   Cocoa and cocoa mixes Baking soda   Commercial casserole mixes Candy-caramels, chocolate  Dehydrated soups    Bars, fudge,nougats  Instant rice and pasta mixes Canned broth or soup  Maraschino cherries Cheese, aged and processed cheese and cheese spreads  Learning Assessment Quiz  Indicated T (for True) or F (for False) for each of  the following statements:  1. _____ Fresh fruits and vegetables and unprocessed grains are generally low in sodium 2. _____ Water may contain a considerable amount of  sodium, depending on the source 3. _____ You can always tell if a food is high in sodium by tasting it 4. _____ Certain laxatives my be high in sodium and should be avoided unless prescribed   by a physician or pharmacist 5. _____ Salt substitutes may be used freely by anyone on a sodium restricted diet 6. _____ Sodium is present in table salt, food additives and as a natural component of   most foods 7. _____ Table salt is approximately 90% sodium 8. _____ Limiting sodium intake may help prevent excess fluid accumulation in the body 9. _____ On a sodium-restricted diet, seasonings such as bouillon soy sauce, and    cooking wine should be used in place of table salt 10. _____ On an ingredient list, a product which lists monosodium glutamate as the first   ingredient is an appropriate food to include on a low sodium diet  Circle the best answer(s) to the following statements (Hint: there may be more than one correct answer)  11. On a low-sodium diet, some acceptable snack items are:    A. Olives  F. Bean dip   K. Grapefruit juice    B. Salted Pretzels G. Commercial Popcorn   L. Canned peaches    C. Carrot Sticks  H. Bouillon   M. Unsalted nuts   D. [place] fries  I. Peanut butter crackers N. Salami   E. Sweet pickles J. Tomato Juice   O. Pizza  12.  Seasonings that may be used freely on a reduced - sodium diet include   A. Lemon wedges F.Monosodium glutamate K. Celery seed    B.Soysauce   G. Pepper   L. Mustard powder   C. Sea salt  H. Cooking wine  M. Onion flakes   D. Vinegar  E. Prepared horseradish N. Salsa   E. Sage   J. Worcestershire sauce  O. Chutney

## 2019-12-16 ENCOUNTER — Telehealth: Payer: Self-pay | Admitting: Cardiovascular Disease

## 2019-12-16 ENCOUNTER — Other Ambulatory Visit: Payer: Self-pay

## 2019-12-16 DIAGNOSIS — I5032 Chronic diastolic (congestive) heart failure: Secondary | ICD-10-CM

## 2019-12-16 DIAGNOSIS — R0609 Other forms of dyspnea: Secondary | ICD-10-CM

## 2019-12-16 DIAGNOSIS — I2510 Atherosclerotic heart disease of native coronary artery without angina pectoris: Secondary | ICD-10-CM

## 2019-12-16 MED ORDER — NITROGLYCERIN 0.4 MG SL SUBL: 0.4000 mg | SUBLINGUAL_TABLET | SUBLINGUAL | 2 refills | Status: DC | PRN

## 2019-12-16 NOTE — Telephone Encounter (Signed)
Follow Up:     Pt says he is still having problems with the shortness of breath.   Pt c/o Shortness Of Breath: STAT if SOB developed within the last 24 hours or pt is noticeably SOB on the phone  1. Are you currently SOB (can you hear that pt is SOB on the phone)?yes, but not right now  2. How long have you been experiencing SOB?  For a while   3. Are you SOB when sitting or when up moving around? When he moves around   4. Are you currently experiencing any other symptoms? Chest discomfort at times- wants to know what he recommends

## 2019-12-16 NOTE — Telephone Encounter (Signed)
Pt called to report that he has had worsening SOB since his last visit 11/18/19 with Gerrianne Scale PA.. he says that he walks his dog three times a day and when he is walking there incline portion he gets discomfort in his chest... just left of his sternum. He says he gets very SOB and when he walks the descending sidewalk he feels much better and is not exerting himself as much.   Pt says he is still having bilateral ankle edema every night. He takes the lasix 40 mg in the morning. When he wakes up the edema is much improved. He does not have trouble sleeping at night he lays on his side never on his back.   Pt has not had to take any nitro... it is expired which I will refill for him now. He says it only last seconds and gets better when he stops exerting himself.   Pt says he had to carry in a case of water this morning and after walking 18 ft and up 3 stairs and across his sunroom he had to sit for a while before he could breath well again... he did not have chest pain at this time.   He denies palpitations, no dizziness, no Gi upset.   Pt last seen 11/18/19... last stress test 02/2019...[name] Kitchen cath 2019.   Will forward to Dr. Angelena Form for review and recommendation. Advised pt to continue monitor, take his nitro if pain is unrelieved, and to call EMS or have his wife take him to the ED if his symptoms change or worsen.

## 2019-12-17 ENCOUNTER — Encounter: Payer: Self-pay | Admitting: *Deleted

## 2019-12-17 MED ORDER — FUROSEMIDE 40 MG PO TABS: 40.0000 mg | ORAL_TABLET | Freq: Two times a day (BID) | ORAL | 3 refills | Status: DC

## 2019-12-17 NOTE — Telephone Encounter (Signed)
Spoke w Mr. Saiki. He will increase lasix 40 mg to BID for a week and come next Thursday for BMET. He is aware he will be contacted to arrange exercise stress myoview and follow up afterward. Staff message to [facility] to help arrange.

## 2019-12-17 NOTE — Telephone Encounter (Signed)
Can we have him increase his Lasix to 40 mg BID for one week, check BMET one week and arrange an exercise nuclear stress test? Also f/u with an APP or me after the stress test? Thanks, Gerald Stabs

## 2019-12-22 ENCOUNTER — Telehealth (HOSPITAL_COMMUNITY): Payer: Self-pay | Admitting: *Deleted

## 2019-12-22 NOTE — Telephone Encounter (Signed)
Patient given detailed instructions per Myocardial Perfusion Study Information Sheet for the test on 12/24/2019 at 0745. Patient notified to arrive 15 minutes early and that it is imperative to arrive on time for appointment to keep from having the test rescheduled.  If you need to cancel or reschedule your appointment, please call the office within 24 hours of your appointment. . Patient verbalized understanding.Peter Brooks, [name]  No mychart available

## 2019-12-24 ENCOUNTER — Other Ambulatory Visit: Payer: PPO | Admitting: *Deleted

## 2019-12-24 ENCOUNTER — Other Ambulatory Visit: Payer: Self-pay

## 2019-12-24 ENCOUNTER — Ambulatory Visit (HOSPITAL_COMMUNITY): Payer: PPO | Attending: Cardiology

## 2019-12-24 DIAGNOSIS — I2510 Atherosclerotic heart disease of native coronary artery without angina pectoris: Secondary | ICD-10-CM

## 2019-12-24 DIAGNOSIS — R0600 Dyspnea, unspecified: Secondary | ICD-10-CM

## 2019-12-24 DIAGNOSIS — I5032 Chronic diastolic (congestive) heart failure: Secondary | ICD-10-CM

## 2019-12-24 DIAGNOSIS — R0609 Other forms of dyspnea: Secondary | ICD-10-CM

## 2019-12-24 LAB — BASIC METABOLIC PANEL
BUN/Creatinine Ratio: 13 (ref 10–24)
BUN: 13 mg/dL (ref 8–27)
CO2: 23 mmol/L (ref 20–29)
Calcium: 9.4 mg/dL (ref 8.6–10.2)
Chloride: 100 mmol/L (ref 96–106)
Creatinine, Ser: 1.04 mg/dL (ref 0.76–1.27)
GFR calc Af Amer: 81 mL/min/{1.73_m2} (ref >59)
GFR calc non Af Amer: 70 mL/min/{1.73_m2} (ref >59)
Glucose: 136 mg/dL — ABNORMAL HIGH (ref 65–99)
Potassium: 4.4 mmol/L (ref 3.5–5.2)
Sodium: 137 mmol/L (ref 134–144)

## 2019-12-24 MED ORDER — TECHNETIUM TC 99M TETROFOSMIN IV KIT
31.2000 | PACK | Freq: Once | INTRAVENOUS | Status: AC | PRN
  Administered 2019-12-24: 31.2 via INTRAVENOUS
  Filled 2019-12-24: qty 32

## 2019-12-29 ENCOUNTER — Ambulatory Visit (HOSPITAL_COMMUNITY): Payer: PPO | Attending: Cardiology

## 2019-12-29 ENCOUNTER — Other Ambulatory Visit: Payer: Self-pay

## 2019-12-29 LAB — MYOCARDIAL PERFUSION IMAGING
Estimated workload: 6.7 METS
Exercise duration (min): 4 min
Exercise duration (sec): 45 s
LV dias vol: 91 mL (ref 62–150)
LV sys vol: 32 mL
MPHR: 145 {beats}/min
Peak HR: 134 {beats}/min
Percent HR: 92 %
Rest HR: 73 {beats}/min
SDS: 0
SRS: 3
SSS: 0
TID: 1.07

## 2019-12-29 MED ORDER — TECHNETIUM TC 99M TETROFOSMIN IV KIT
31.7000 | PACK | Freq: Once | INTRAVENOUS | Status: AC | PRN
  Administered 2019-12-29: 31.7 via INTRAVENOUS
  Filled 2019-12-29: qty 32

## 2020-01-04 ENCOUNTER — Telehealth: Payer: Self-pay | Admitting: *Deleted

## 2020-01-04 NOTE — Progress Notes (Signed)
Pt has been made aware of normal result and verbalized understanding.  jw

## 2020-01-04 NOTE — Telephone Encounter (Signed)
-----   Message from Burnell Blanks, MD sent at 01/04/2020  2:46 PM EDT ----- Can we check and see if he had any weight loss or improvement in LE edema/SOB with increased dose of Lasix? Thanks, chris ----- Message ----- From: Jeanann Lewandowsky, RMA Sent: 01/04/2020   2:13 PM EDT To: Burnell Blanks, MD, #  Pt advised that he was still experiencing sob and edema in lower extremities. He said someone was supposed to be working on getting him a sooner appt but he hadn't heard from anyone about it.Advised pt I would send Cornel Werber, RN a message to have her call him back with this appt.

## 2020-01-04 NOTE — Telephone Encounter (Signed)
I spoke with Peter Brooks.  He has had some weight loss.  Close to 15 pounds. This is intentional and he still has the swelling in his LE and SOB despite increased dose of lasix.   He was scheduled back in Oct.  I have moved his next appointment with Peter Brooks to 01/15/20.

## 2020-01-15 ENCOUNTER — Other Ambulatory Visit: Payer: Self-pay

## 2020-01-15 ENCOUNTER — Encounter: Payer: Self-pay | Admitting: Cardiovascular Disease

## 2020-01-15 ENCOUNTER — Encounter: Payer: Self-pay | Admitting: *Deleted

## 2020-01-15 ENCOUNTER — Ambulatory Visit: Payer: PPO | Admitting: Cardiovascular Disease

## 2020-01-15 VITALS — BP 134/80 | HR 65 | Ht 74.0 in | Wt 267.0 lb

## 2020-01-15 DIAGNOSIS — I200 Unstable angina: Secondary | ICD-10-CM | POA: Diagnosis not present

## 2020-01-15 DIAGNOSIS — I5032 Chronic diastolic (congestive) heart failure: Secondary | ICD-10-CM | POA: Diagnosis not present

## 2020-01-15 DIAGNOSIS — E782 Mixed hyperlipidemia: Secondary | ICD-10-CM

## 2020-01-15 DIAGNOSIS — Z01812 Encounter for preprocedural laboratory examination: Secondary | ICD-10-CM

## 2020-01-15 DIAGNOSIS — I10 Essential (primary) hypertension: Secondary | ICD-10-CM

## 2020-01-15 DIAGNOSIS — I471 Supraventricular tachycardia: Secondary | ICD-10-CM | POA: Diagnosis not present

## 2020-01-15 NOTE — Patient Instructions (Signed)
Medication Instructions:  No changes *If you need a refill on your cardiac medications before your next appointment, please call your pharmacy*   Lab Work: Today: bmet, cbc  If you have labs (blood work) drawn today and your tests are completely normal, you will receive your results only by: [name] Kitchen MyChart Message (if you have MyChart) OR . A paper copy in the mail If you have any lab test that is abnormal or we need to change your treatment, we will call you to review the results.   Testing/Procedures: Your physician has requested that you have a cardiac catheterization. Cardiac catheterization is used to diagnose and/or treat various heart conditions. Doctors may recommend this procedure for a number of different reasons. The most common reason is to evaluate chest pain. Chest pain can be a symptom of coronary artery disease (CAD), and cardiac catheterization can show whether plaque is narrowing or blocking your heart's arteries. This procedure is also used to evaluate the valves, as well as measure the blood flow and oxygen levels in different parts of your heart. For further information please visit [email or web address]. Please follow instruction sheet, as given.   Follow-Up: At [facility], you and your health needs are our priority.  As part of our continuing mission to provide you with exceptional heart care, we have created designated Provider Care Teams.  These Care Teams include your primary Cardiologist (physician) and Advanced Practice Providers (APPs -  Physician Assistants and Nurse Practitioners) who all work together to provide you with the care you need, when you need it.  We recommend signing up for the patient portal called "MyChart".  Sign up information is provided on this After Visit Summary.  MyChart is used to connect with patients for Virtual Visits (Telemedicine).  Patients are able to view lab/test results, encounter notes, upcoming appointments, etc.  Non-urgent messages  can be sent to your provider as well.   To learn more about what you can do with MyChart, go to [email or web address].    Your next appointment:   4 week(s)  The format for your next appointment:   In Person  Provider:   You may see one of the following Advanced Practice Providers on your designated Care Team:    Melina Copa, PA-C  Ermalinda Barrios, PA-C    Other Instructions

## 2020-01-15 NOTE — Progress Notes (Signed)
Chief Complaint  Patient presents with  . Follow-up    CAD   History of Present Illness: 74 yo male with history of CAD, chronic diastolic CHF, HTN, HLD, GERD and SVT here today for cardiac follow up.  Cardiac cath June 2010 with non-obstructive CAD and LVEF 60%.  He was seen in January 2015 and had c/o heart racing, dizziness, fatigue. Stress myoview 07/27/13 without ischemia. Echo 07/27/13 with normal LV function, no significant valve issues. Event monitor in 2015 without any arrythmias. He called our office in July 2019 with c/o palpitations. Cardiac monitor August 2019 with PVCs, PACs, short run of SVT and 4 beat run of non-sustained VT. Toprol was started. I saw him in the office in October 2019 and he c/o progressive dyspnea and chest pain with exertion. Echo October 2019 with normal LV systolic function. No significant valve disease. Cardiac cath 03/11/18 with severe stenosis in the right PDA treated with a drug eluting stent. Mild non-obstructive disease in the LAD and Circumflex. He was seen via telemedicine visit 01/19/19 and had c/o chest pain with exertion. Nuclear stress test 02/20/19 with no evidence of ischemia. Cardiac monitor September 2020 with sinus, several short runs of SVT. I saw him in May 2021 and he reported increased dyspnea and LE edema. Lasix was started. Echo 10/12/19 with LVEF=60-65%, no valve disease. Nuclear stress test August 2021 with no ischemia.   He is here today for follow up. The patient denies any palpitations, orthopnea, PND, dizziness, near syncope or syncope. He continues to have dyspnea and chest pain with moderate exertion. His LE edema is much improved on Lasix 40 mg po BID.    Primary Care Physician: Street, Sharon Mt, MD  Past Medical History:  Diagnosis Date  . Anxiety   . Arthritis   . CAD (coronary artery disease)    nonobstructive CAD with 40% narrowing in the proximal LAD   . Cataract    removed bilaterally   . Chest pain   . Diabetes  mellitus without complication ([place])   . Dizziness   . GERD (gastroesophageal reflux disease)   . Hyperlipidemia   . Hypertension   . Hypothyroidism   . Lumbar herniated disc     Past Surgical History:  Procedure Laterality Date  . CARDIAC CATHETERIZATION  11/11/2008  . CATARACT EXTRACTION Right   . CATARACT EXTRACTION Left   . COLONOSCOPY    . CORONARY STENT INTERVENTION N/A 03/11/2018   Procedure: CORONARY STENT INTERVENTION;  Surgeon: Martinique, Peter M, MD;  Location: [facility];  Service: Cardiovascular;  Laterality: N/A;  . KNEE ARTHROSCOPY Bilateral 2010 Right  . POLYPECTOMY    . RIGHT/LEFT HEART CATH AND CORONARY ANGIOGRAPHY N/A 03/11/2018   Procedure: RIGHT/LEFT HEART CATH AND CORONARY ANGIOGRAPHY;  Surgeon: Martinique, Peter M, MD;  Location: [facility];  Service: Cardiovascular;  Laterality: N/A;    Current Outpatient Medications  Medication Sig Dispense Refill  . amLODipine (NORVASC) 5 MG tablet Take 1 tablet by mouth once daily 90 tablet 1  . Ascorbic Acid (VITAMIN C) 1000 MG tablet Take 1,000 mg by mouth daily.    [name] Kitchen aspirin EC 81 MG tablet Take 81 mg by mouth every evening.     . Blood Glucose Monitoring Suppl (ONE TOUCH ULTRA 2) w/Device KIT See admin instructions.    . Calcium Carbonate-Vitamin D (CALTRATE 600+D) 600-400 MG-UNIT per tablet Take 1 tablet by mouth daily.    . clopidogrel (PLAVIX) 75 MG tablet Take 1  tablet by mouth once daily 90 tablet 1  . Coenzyme Q10 (COQ-10) 400 MG CAPS Take 400 mg by mouth daily.    . diazepam (VALIUM) 2 MG tablet Take 1 tablet (2 mg total) by mouth every 12 (twelve) hours as needed for anxiety. 30 tablet 1  . docusate sodium (COLACE) 100 MG capsule Take 100 mg by mouth daily as needed for mild constipation.    . finasteride (PROSCAR) 5 MG tablet Take 5 mg by mouth daily.    . furosemide (LASIX) 40 MG tablet Take 1 tablet (40 mg total) by mouth 2 (two) times daily. For one week then go back to 40 mg once daily 180 tablet  3  . levothyroxine (SYNTHROID, LEVOTHROID) 50 MCG tablet Take 50 mcg by mouth daily.    . metFORMIN (GLUCOPHAGE-XR) 500 MG 24 hr tablet Take 500 mg by mouth 2 (two) times daily.    . metoprolol succinate (TOPROL XL) 100 MG 24 hr tablet Take 1 tablet (100 mg total) by mouth daily. 90 tablet 3  . Multiple Vitamin (MULITIVITAMIN WITH MINERALS) TABS Take 1 tablet by mouth daily.    . Multiple Vitamins-Minerals (PRESERVISION AREDS 2) CAPS Take 1 capsule by mouth 2 (two) times daily.     . nitroGLYCERIN (NITROSTAT) 0.4 MG SL tablet Place 1 tablet (0.4 mg total) under the tongue every 5 (five) minutes as needed. 25 tablet 2  . ONETOUCH ULTRA test strip USE 1 STRIP TO CHECK GLUCOSE ONCE DAILY    . potassium chloride SA (KLOR-CON) 20 MEQ tablet Take 1 tablet (20 mEq total) by mouth daily. 90 tablet 3  . Probiotic Product (PROBIOTIC DAILY PO) Take 1 tablet by mouth daily.    . rosuvastatin (CRESTOR) 5 MG tablet Take 5 mg by mouth every evening.   0   No current facility-administered medications for this visit.    No Known Allergies  Social History   Socioeconomic History  . Marital status: Married    Spouse name: Not on file  . Number of children: Not on file  . Years of education: Not on file  . Highest education level: Not on file  Occupational History  . Not on file  Tobacco Use  . Smoking status: Never Smoker  . Smokeless tobacco: Never Used  Vaping Use  . Vaping Use: Never used  Substance and Sexual Activity  . Alcohol use: No  . Drug use: No  . Sexual activity: Yes    Comment: married  Other Topics Concern  . Not on file  Social History Narrative  . Not on file   Social Determinants of Health   Financial Resource Strain:   . Difficulty of Paying Living Expenses: Not on file  Food Insecurity:   . Worried About Charity fundraiser in the Last Year: Not on file  . Ran Out of Food in the Last Year: Not on file  Transportation Needs:   . Lack of Transportation (Medical): Not  on file  . Lack of Transportation (Non-Medical): Not on file  Physical Activity:   . Days of Exercise per Week: Not on file  . Minutes of Exercise per Session: Not on file  Stress:   . Feeling of Stress : Not on file  Social Connections:   . Frequency of Communication with Friends and Family: Not on file  . Frequency of Social Gatherings with Friends and Family: Not on file  . Attends Religious Services: Not on file  . Active Member of Clubs  or Organizations: Not on file  . Attends Archivist Meetings: Not on file  . Marital Status: Not on file  Intimate Partner Violence:   . Fear of Current or Ex-Partner: Not on file  . Emotionally Abused: Not on file  . Physically Abused: Not on file  . Sexually Abused: Not on file    Family History  Problem Relation Age of Onset  . Colon cancer Paternal Uncle 22  . Coronary artery disease Father        with CABG valve replacement in his 46's  . Heart attack Mother        in her 93's  . Esophageal cancer Neg Hx   . Rectal cancer Neg Hx   . Stomach cancer Neg Hx   . Colon polyps Neg Hx     Review of Systems:  As stated in the HPI and otherwise negative.   BP 134/80   Pulse 65   Ht _0  (1.88 m)   Wt 267 lb (121.1 kg)   SpO2 96%   BMI 34.28 kg/m   Physical Examination:  General: Well developed, well nourished, NAD  HEENT: OP clear, mucus membranes moist  SKIN: warm, dry. No rashes. Neuro: No focal deficits  Musculoskeletal: Muscle strength 5/5 all ext  Psychiatric: Mood and affect normal  Neck: No JVD, no carotid bruits, no thyromegaly, no lymphadenopathy.  Lungs:Clear bilaterally, no wheezes, rhonci, crackles Cardiovascular: Regular rate and rhythm. No murmurs, gallops or rubs. Abdomen:Soft. Bowel sounds present. Non-tender.  Extremities: Trace bilateral lower extremity edema. Pulses are 2 + in the bilateral DP/PT.  Echo May 2021:  1. Left ventricular ejection fraction, by estimation, is 60 to 65%. The  left  ventricle has normal function. The left ventricle has no regional  wall motion abnormalities. There is mild concentric left ventricular  hypertrophy. Left ventricular diastolic  parameters are consistent with Grade I diastolic dysfunction (impaired  relaxation).  2. Right ventricular systolic function is normal. The right ventricular  size is normal. There is normal pulmonary artery systolic pressure. The  estimated right ventricular systolic pressure is 40.7 mmHg.  3. The mitral valve is normal in structure. No evidence of mitral valve  regurgitation. No evidence of mitral stenosis.  4. The aortic valve is tricuspid. Aortic valve regurgitation is not  visualized. Mild to moderate aortic valve sclerosis/calcification is  present, without any evidence of aortic stenosis.  5. The inferior vena cava is normal in size with greater than 50%  respiratory variability, suggesting right atrial pressure of 3 mmHg.   EKG:  EKG is ordered today. The ekg ordered today demonstrates sinus, incomplete RBBB  Recent Labs: 12/24/2019: BUN 13; Creatinine, Ser 1.04; Potassium 4.4; Sodium 137   Lipid Panel Followed in primary care   Wt Readings from Last 3 Encounters:  01/15/20 267 lb (121.1 kg)  12/24/19 267 lb (121.1 kg)  11/18/19 267 lb (121.1 kg)     Other studies Reviewed: Additional studies/ records that were reviewed today include: . Review of the above records demonstrates:    Assessment and Plan:   1. CAD with unstable angina: He is having chest pain and dyspnea with moderate exertion c/w unstable angina. Stress test low risk earlier this month but given symptoms, will repeat right and left heart cath to exclude progression of CAD and assess pressures.  -Continue ASA, Plavix, statin and beta blocker.  -Cardiac cath 02/08/20 at [facility]. He does not wish to have it sooner as his wife currently has  shingles.   2. HTN: BP is controlled. No changes  3. HLD:  Lipids followed in primary care.  LDL 58 in January 2021. Will continue statin.    4. PVCs/PACs/SVT: He has no palpitations. Will continue Toprol.    5. Chronic diastolic CHF: LV systolic function normal by echo in May 2021. No valve disease. Volume status today is normal. Weight is stable. Continue Lasix 40 mg po BID. Right heart cath to assess pressures given ongoing dyspnea.   Current medicines are reviewed at length with the patient today.  The patient does not have concerns regarding medicines.  The following changes have been made:    Labs/ tests ordered today include:   Orders Placed This Encounter  Procedures  . Basic metabolic panel  . CBC  . EKG 12-Lead    Disposition:   Follow up with care team APP in 8 weeks.     Signed, Lauree Chandler, MD 01/15/2020 4:07 PM    [facility] [address], [place], [place]  [address] Phone: [phone]; Fax: [phone]

## 2020-01-16 LAB — BASIC METABOLIC PANEL
BUN/Creatinine Ratio: 11 (ref 10–24)
BUN: 13 mg/dL (ref 8–27)
CO2: 25 mmol/L (ref 20–29)
Calcium: 9.4 mg/dL (ref 8.6–10.2)
Chloride: 103 mmol/L (ref 96–106)
Creatinine, Ser: 1.14 mg/dL (ref 0.76–1.27)
GFR calc Af Amer: 72 mL/min/{1.73_m2} (ref >59)
GFR calc non Af Amer: 63 mL/min/{1.73_m2} (ref >59)
Glucose: 107 mg/dL — ABNORMAL HIGH (ref 65–99)
Potassium: 4.3 mmol/L (ref 3.5–5.2)
Sodium: 139 mmol/L (ref 134–144)

## 2020-01-16 LAB — CBC
Hematocrit: 39.9 % (ref 37.5–51.0)
Hemoglobin: 13.4 g/dL (ref 13.0–17.7)
MCH: 29.2 pg (ref 26.6–33.0)
MCHC: 33.6 g/dL (ref 31.5–35.7)
MCV: 87 fL (ref 79–97)
Platelets: 298 10*3/uL (ref 150–450)
RBC: 4.59 x10E6/uL (ref 4.14–5.80)
RDW: 13.3 % (ref 11.6–15.4)
WBC: 8.4 10*3/uL (ref 3.4–10.8)

## 2020-01-19 ENCOUNTER — Telehealth: Payer: Self-pay | Admitting: Cardiovascular Disease

## 2020-01-19 DIAGNOSIS — I5032 Chronic diastolic (congestive) heart failure: Secondary | ICD-10-CM

## 2020-01-19 DIAGNOSIS — I25119 Atherosclerotic heart disease of native coronary artery with unspecified angina pectoris: Secondary | ICD-10-CM

## 2020-01-19 DIAGNOSIS — Z01812 Encounter for preprocedural laboratory examination: Secondary | ICD-10-CM

## 2020-01-19 NOTE — Telephone Encounter (Signed)
Spoke with pt and moved Cath to 9/30.  Labs will be over 30 days so I scheduled him to have labs done same day as covid screen, 9/28.  Pt verbalized understanding and was appreciative for call.

## 2020-01-19 NOTE — Telephone Encounter (Signed)
Patient is calling to let Dr. Angelena Form he needs to reschedule his cath that is scheduled for 02/08/20, and reschedule at sometime after 02/16/20.

## 2020-02-01 ENCOUNTER — Other Ambulatory Visit: Payer: Self-pay | Admitting: Cardiovascular Disease

## 2020-02-06 ENCOUNTER — Other Ambulatory Visit (HOSPITAL_COMMUNITY): Payer: PPO

## 2020-02-16 ENCOUNTER — Other Ambulatory Visit: Payer: PPO

## 2020-02-16 ENCOUNTER — Other Ambulatory Visit: Payer: Self-pay

## 2020-02-16 ENCOUNTER — Other Ambulatory Visit (HOSPITAL_COMMUNITY)
Admission: RE | Admit: 2020-02-16 | Discharge: 2020-02-16 | Disposition: A | Discharge status: Home / Self Care | Payer: PPO | Source: Ambulatory Visit | Attending: Cardiovascular Disease | Admitting: Cardiovascular Disease

## 2020-02-16 DIAGNOSIS — Z20822 Contact with and (suspected) exposure to covid-19: Secondary | ICD-10-CM | POA: Insufficient documentation

## 2020-02-16 DIAGNOSIS — I25119 Atherosclerotic heart disease of native coronary artery with unspecified angina pectoris: Secondary | ICD-10-CM

## 2020-02-16 DIAGNOSIS — I5032 Chronic diastolic (congestive) heart failure: Secondary | ICD-10-CM

## 2020-02-16 DIAGNOSIS — Z01812 Encounter for preprocedural laboratory examination: Secondary | ICD-10-CM | POA: Diagnosis not present

## 2020-02-16 LAB — BASIC METABOLIC PANEL
BUN/Creatinine Ratio: 11 (ref 10–24)
BUN: 14 mg/dL (ref 8–27)
CO2: 24 mmol/L (ref 20–29)
Calcium: 9.9 mg/dL (ref 8.6–10.2)
Chloride: 100 mmol/L (ref 96–106)
Creatinine, Ser: 1.29 mg/dL — ABNORMAL HIGH (ref 0.76–1.27)
GFR calc Af Amer: 62 mL/min/{1.73_m2} (ref >59)
GFR calc non Af Amer: 54 mL/min/{1.73_m2} — ABNORMAL LOW (ref >59)
Glucose: 119 mg/dL — ABNORMAL HIGH (ref 65–99)
Potassium: 4.6 mmol/L (ref 3.5–5.2)
Sodium: 139 mmol/L (ref 134–144)

## 2020-02-16 LAB — CBC
Hematocrit: 43.7 % (ref 37.5–51.0)
Hemoglobin: 14 g/dL (ref 13.0–17.7)
MCH: 28.8 pg (ref 26.6–33.0)
MCHC: 32 g/dL (ref 31.5–35.7)
MCV: 90 fL (ref 79–97)
Platelets: 268 10*3/uL (ref 150–450)
RBC: 4.86 x10E6/uL (ref 4.14–5.80)
RDW: 12.9 % (ref 11.6–15.4)
WBC: 8.3 10*3/uL (ref 3.4–10.8)

## 2020-02-16 LAB — SARS CORONAVIRUS 2 (TAT 6-24 HRS): SARS Coronavirus 2: NEGATIVE

## 2020-02-17 ENCOUNTER — Telehealth: Payer: Self-pay | Admitting: *Deleted

## 2020-02-17 NOTE — Telephone Encounter (Signed)
Pt contacted pre-catheterization scheduled at [facility] for: Thursday February 18, 2020 9 AM Verified arrival time and place: [facility]) at: 7 AM   No solid food after midnight prior to cath, clear liquids until 5 AM day of procedure.  Hold: Lasix/KCl-PM prior/AM of procedure-GFR 54 Metformin-day of procedure and 48 hours post procedure  Except hold medications AM meds can be  taken pre-cath with sips of water including: ASA 81 mg   Confirmed patient has responsible adult to drive home post procedure and be with patient first 24 hours after arriving home: yes  You are allowed ONE visitor in the waiting room during the time you are at the hospital for your procedure. Both you and your visitor must wear a mask once you enter the hospital.       COVID-19 Pre-Screening Questions:   In the past 14 days have you had a new cough, new headache, new nasal congestion, fever (100.4 or greater) unexplained body aches, new sore throat, or sudden loss of taste or sense of smell? no  In the past 14 days have you been around anyone with known Covid 19? no  Have you been vaccinated for COVID-19? Yes, see immunization history   Reviewed procedure/mask/visitor instructions, COVID-19 questions with patient.

## 2020-02-18 ENCOUNTER — Encounter (HOSPITAL_COMMUNITY): Payer: Self-pay | Admitting: Cardiovascular Disease

## 2020-02-18 ENCOUNTER — Ambulatory Visit (HOSPITAL_COMMUNITY)
Admission: RE | Disposition: A | Discharge status: Home / Self Care | Payer: PPO | Source: Home / Self Care | Attending: Cardiovascular Disease

## 2020-02-18 ENCOUNTER — Ambulatory Visit (HOSPITAL_COMMUNITY)
Admission: RE | Admit: 2020-02-18 | Discharge: 2020-02-18 | Disposition: A | Discharge status: Home / Self Care | Payer: PPO | Attending: Cardiovascular Disease | Admitting: Cardiovascular Disease

## 2020-02-18 ENCOUNTER — Other Ambulatory Visit: Payer: Self-pay

## 2020-02-18 DIAGNOSIS — I110 Hypertensive heart disease with heart failure: Secondary | ICD-10-CM | POA: Insufficient documentation

## 2020-02-18 DIAGNOSIS — E119 Type 2 diabetes mellitus without complications: Secondary | ICD-10-CM | POA: Diagnosis not present

## 2020-02-18 DIAGNOSIS — F419 Anxiety disorder, unspecified: Secondary | ICD-10-CM | POA: Insufficient documentation

## 2020-02-18 DIAGNOSIS — I25110 Atherosclerotic heart disease of native coronary artery with unstable angina pectoris: Secondary | ICD-10-CM | POA: Diagnosis not present

## 2020-02-18 DIAGNOSIS — E118 Type 2 diabetes mellitus with unspecified complications: Secondary | ICD-10-CM

## 2020-02-18 DIAGNOSIS — Z79890 Hormone replacement therapy (postmenopausal): Secondary | ICD-10-CM | POA: Diagnosis not present

## 2020-02-18 DIAGNOSIS — E785 Hyperlipidemia, unspecified: Secondary | ICD-10-CM | POA: Insufficient documentation

## 2020-02-18 DIAGNOSIS — I5032 Chronic diastolic (congestive) heart failure: Secondary | ICD-10-CM | POA: Diagnosis not present

## 2020-02-18 DIAGNOSIS — I200 Unstable angina: Secondary | ICD-10-CM

## 2020-02-18 DIAGNOSIS — Z8249 Family history of ischemic heart disease and other diseases of the circulatory system: Secondary | ICD-10-CM | POA: Insufficient documentation

## 2020-02-18 DIAGNOSIS — E039 Hypothyroidism, unspecified: Secondary | ICD-10-CM | POA: Insufficient documentation

## 2020-02-18 DIAGNOSIS — Z79899 Other long term (current) drug therapy: Secondary | ICD-10-CM | POA: Insufficient documentation

## 2020-02-18 DIAGNOSIS — I471 Supraventricular tachycardia: Secondary | ICD-10-CM | POA: Diagnosis not present

## 2020-02-18 DIAGNOSIS — Z7984 Long term (current) use of oral hypoglycemic drugs: Secondary | ICD-10-CM | POA: Diagnosis not present

## 2020-02-18 DIAGNOSIS — Z7902 Long term (current) use of antithrombotics/antiplatelets: Secondary | ICD-10-CM | POA: Diagnosis not present

## 2020-02-18 DIAGNOSIS — K219 Gastro-esophageal reflux disease without esophagitis: Secondary | ICD-10-CM | POA: Insufficient documentation

## 2020-02-18 DIAGNOSIS — I2510 Atherosclerotic heart disease of native coronary artery without angina pectoris: Secondary | ICD-10-CM

## 2020-02-18 DIAGNOSIS — Z7982 Long term (current) use of aspirin: Secondary | ICD-10-CM | POA: Insufficient documentation

## 2020-02-18 DIAGNOSIS — Z955 Presence of coronary angioplasty implant and graft: Secondary | ICD-10-CM | POA: Insufficient documentation

## 2020-02-18 DIAGNOSIS — I10 Essential (primary) hypertension: Secondary | ICD-10-CM | POA: Diagnosis present

## 2020-02-18 DIAGNOSIS — Z95820 Peripheral vascular angioplasty status with implants and grafts: Secondary | ICD-10-CM

## 2020-02-18 HISTORY — PX: RIGHT/LEFT HEART CATH AND CORONARY ANGIOGRAPHY: CATH118266

## 2020-02-18 HISTORY — PX: CORONARY STENT INTERVENTION: CATH118234

## 2020-02-18 LAB — POCT I-STAT EG7
Acid-Base Excess: 0 mmol/L (ref 0.0–2.0)
Bicarbonate: 25.1 mmol/L (ref 20.0–28.0)
Calcium, Ion: 1.21 mmol/L (ref 1.15–1.40)
HCT: 39 % (ref 39.0–52.0)
Hemoglobin: 13.3 g/dL (ref 13.0–17.0)
O2 Saturation: 72 %
Potassium: 3.9 mmol/L (ref 3.5–5.1)
Sodium: 144 mmol/L (ref 135–145)
TCO2: 26 mmol/L (ref 22–32)
pCO2, Ven: 40.5 mmHg — ABNORMAL LOW (ref 44.0–60.0)
pH, Ven: 7.401 (ref 7.250–7.430)
pO2, Ven: 38 mmHg (ref 32.0–45.0)

## 2020-02-18 LAB — POCT I-STAT 7, (LYTES, BLD GAS, ICA,H+H)
Acid-base deficit: 1 mmol/L (ref 0.0–2.0)
Bicarbonate: 24.1 mmol/L (ref 20.0–28.0)
Calcium, Ion: 1.14 mmol/L — ABNORMAL LOW (ref 1.15–1.40)
HCT: 37 % — ABNORMAL LOW (ref 39.0–52.0)
Hemoglobin: 12.6 g/dL — ABNORMAL LOW (ref 13.0–17.0)
O2 Saturation: 93 %
Potassium: 3.7 mmol/L (ref 3.5–5.1)
Sodium: 145 mmol/L (ref 135–145)
TCO2: 25 mmol/L (ref 22–32)
pCO2 arterial: 39.7 mmHg (ref 32.0–48.0)
pH, Arterial: 7.39 (ref 7.350–7.450)
pO2, Arterial: 68 mmHg — ABNORMAL LOW (ref 83.0–108.0)

## 2020-02-18 LAB — GLUCOSE, CAPILLARY
Glucose-Capillary: 147 mg/dL — ABNORMAL HIGH (ref 70–99)
Glucose-Capillary: 125 mg/dL — ABNORMAL HIGH (ref 70–99)

## 2020-02-18 LAB — POCT ACTIVATED CLOTTING TIME: Activated Clotting Time: 257 seconds

## 2020-02-18 SURGERY — RIGHT/LEFT HEART CATH AND CORONARY ANGIOGRAPHY
Anesthesia: LOCAL

## 2020-02-18 MED ORDER — CLOPIDOGREL BISULFATE 75 MG PO TABS: 75.0000 mg | ORAL_TABLET | Freq: Every day | ORAL | 2 refills | Status: DC

## 2020-02-18 MED ORDER — LIDOCAINE HCL (PF) 1 % IJ SOLN
INTRAMUSCULAR | Status: AC
  Filled 2020-02-18: qty 30

## 2020-02-18 MED ORDER — FENTANYL CITRATE (PF) 100 MCG/2ML IJ SOLN
INTRAMUSCULAR | Status: DC | PRN
Start: 2020-02-18 — End: 2020-02-18
  Administered 2020-02-18: 25 ug via INTRAVENOUS

## 2020-02-18 MED ORDER — IOHEXOL 350 MG/ML SOLN
INTRAVENOUS | Status: DC | PRN
  Administered 2020-02-18: 170 mL

## 2020-02-18 MED ORDER — LIDOCAINE HCL (PF) 1 % IJ SOLN
INTRAMUSCULAR | Status: DC | PRN
  Administered 2020-02-18 (×2): 2 mL

## 2020-02-18 MED ORDER — HEPARIN SODIUM (PORCINE) 1000 UNIT/ML IJ SOLN
INTRAMUSCULAR | Status: AC
  Filled 2020-02-18: qty 1

## 2020-02-18 MED ORDER — NITROGLYCERIN 0.4 MG SL SUBL: 0.4000 mg | SUBLINGUAL_TABLET | SUBLINGUAL | 3 refills | Status: AC | PRN

## 2020-02-18 MED ORDER — SODIUM CHLORIDE 0.9 % WEIGHT BASED INFUSION
3.0000 mL/kg/h | INTRAVENOUS | Status: AC
  Administered 2020-02-18: 3 mL/kg/h via INTRAVENOUS

## 2020-02-18 MED ORDER — SODIUM CHLORIDE 0.9 % WEIGHT BASED INFUSION: 1.0000 mL/kg/h | INTRAVENOUS | Status: DC

## 2020-02-18 MED ORDER — HEPARIN SODIUM (PORCINE) 1000 UNIT/ML IJ SOLN
INTRAMUSCULAR | Status: DC | PRN
  Administered 2020-02-18: 6000 [IU] via INTRAVENOUS
  Administered 2020-02-18: 2000 [IU] via INTRAVENOUS
  Administered 2020-02-18: 6000 [IU] via INTRAVENOUS

## 2020-02-18 MED ORDER — SODIUM CHLORIDE 0.9 % IV SOLN: 250.0000 mL | INTRAVENOUS | Status: DC | PRN

## 2020-02-18 MED ORDER — HEPARIN (PORCINE) IN NACL 1000-0.9 UT/500ML-% IV SOLN
INTRAVENOUS | Status: DC | PRN
  Administered 2020-02-18 (×2): 500 mL

## 2020-02-18 MED ORDER — ACETAMINOPHEN 325 MG PO TABS: 650.0000 mg | ORAL_TABLET | ORAL | Status: DC | PRN

## 2020-02-18 MED ORDER — FENTANYL CITRATE (PF) 100 MCG/2ML IJ SOLN
INTRAMUSCULAR | Status: AC
  Filled 2020-02-18: qty 2

## 2020-02-18 MED ORDER — HYDRALAZINE HCL 20 MG/ML IJ SOLN: 10.0000 mg | INTRAMUSCULAR | Status: DC | PRN

## 2020-02-18 MED ORDER — ROSUVASTATIN CALCIUM 20 MG PO TABS: 20.0000 mg | ORAL_TABLET | Freq: Every evening | ORAL | 2 refills | Status: DC

## 2020-02-18 MED ORDER — SODIUM CHLORIDE 0.9% FLUSH: 3.0000 mL | Freq: Two times a day (BID) | INTRAVENOUS | Status: DC

## 2020-02-18 MED ORDER — ASPIRIN 81 MG PO CHEW: 81.0000 mg | CHEWABLE_TABLET | ORAL | Status: DC

## 2020-02-18 MED ORDER — NITROGLYCERIN 1 MG/10 ML FOR IR/CATH LAB
INTRA_ARTERIAL | Status: AC
  Filled 2020-02-18: qty 10

## 2020-02-18 MED ORDER — LABETALOL HCL 5 MG/ML IV SOLN: 10.0000 mg | INTRAVENOUS | Status: DC | PRN

## 2020-02-18 MED ORDER — ONDANSETRON HCL 4 MG/2ML IJ SOLN: 4.0000 mg | Freq: Four times a day (QID) | INTRAMUSCULAR | Status: DC | PRN

## 2020-02-18 MED ORDER — MIDAZOLAM HCL 2 MG/2ML IJ SOLN
INTRAMUSCULAR | Status: AC
  Filled 2020-02-18: qty 2

## 2020-02-18 MED ORDER — SODIUM CHLORIDE 0.9 % IV SOLN: INTRAVENOUS | Status: DC

## 2020-02-18 MED ORDER — VERAPAMIL HCL 2.5 MG/ML IV SOLN
INTRAVENOUS | Status: AC
  Filled 2020-02-18: qty 2

## 2020-02-18 MED ORDER — MIDAZOLAM HCL 2 MG/2ML IJ SOLN
INTRAMUSCULAR | Status: DC | PRN
  Administered 2020-02-18: 2 mg via INTRAVENOUS

## 2020-02-18 MED ORDER — SODIUM CHLORIDE 0.9% FLUSH: 3.0000 mL | INTRAVENOUS | Status: DC | PRN

## 2020-02-18 MED ORDER — VERAPAMIL HCL 2.5 MG/ML IV SOLN
INTRAVENOUS | Status: DC | PRN
  Administered 2020-02-18: 10 mL via INTRA_ARTERIAL

## 2020-02-18 MED ORDER — HEPARIN (PORCINE) IN NACL 1000-0.9 UT/500ML-% IV SOLN
INTRAVENOUS | Status: AC
  Filled 2020-02-18: qty 1000

## 2020-02-18 SURGICAL SUPPLY — 20 items
BALLN SAPPHIRE 2.0X12 (BALLOONS) ×2
BALLOON SAPPHIRE 2.0X12 (BALLOONS) ×1 IMPLANT
CATH 5FR JL3.5 JR4 ANG PIG MP (CATHETERS) ×2 IMPLANT
CATH BALLN WEDGE 5F 110CM (CATHETERS) ×2 IMPLANT
CATH VISTA GUIDE 6FR XB3 (CATHETERS) ×2 IMPLANT
DEVICE RAD COMP TR BAND LRG (VASCULAR PRODUCTS) ×2 IMPLANT
GLIDESHEATH SLEND SS 6F .021 (SHEATH) ×2 IMPLANT
GUIDEWIRE INQWIRE 1.5J.035X260 (WIRE) ×1 IMPLANT
INQWIRE 1.5J .035X260CM (WIRE) ×2
KIT ENCORE 26 ADVANTAGE (KITS) ×2 IMPLANT
KIT HEART LEFT (KITS) ×2 IMPLANT
PACK CARDIAC CATHETERIZATION (CUSTOM PROCEDURE TRAY) ×2 IMPLANT
SHEATH GLIDE SLENDER 4/5FR (SHEATH) ×2 IMPLANT
STENT SYNERGY XD 2.25X12 (Permanent Stent) ×1 IMPLANT
SYNERGY XD 2.25X12 (Permanent Stent) ×2 IMPLANT
TRANSDUCER W/STOPCOCK (MISCELLANEOUS) ×2 IMPLANT
TUBING CIL FLEX 10 FLL-RA (TUBING) ×2 IMPLANT
WIRE COUGAR XT STRL 190CM (WIRE) ×2 IMPLANT
WIRE EMERALD 3MM-J .025X260CM (WIRE) ×2 IMPLANT
WIRE HI TORQ WHISPER MS 190CM (WIRE) ×2 IMPLANT

## 2020-02-18 NOTE — Discharge Instructions (Signed)
Radial Site Care  This sheet gives you information about how to care for yourself after your procedure. Your health care provider may also give you more specific instructions. If you have problems or questions, contact your health care provider. What can I expect after the procedure? After the procedure, it is common to have:  Bruising and tenderness at the catheter insertion area. Follow these instructions at home: Medicines  Take over-the-counter and prescription medicines only as told by your health care provider. Insertion site care  Follow instructions from your health care provider about how to take care of your insertion site. Make sure you: ? Wash your hands with soap and water before you change your bandage (dressing). If soap and water are not available, use hand sanitizer. ? Change your dressing as told by your health care provider. ? Leave stitches (sutures), skin glue, or adhesive strips in place. These skin closures may need to stay in place for 2 weeks or longer. If adhesive strip edges start to loosen and curl up, you may trim the loose edges. Do not remove adhesive strips completely unless your health care provider tells you to do that.  Check your insertion site every day for signs of infection. Check for: ? Redness, swelling, or pain. ? Fluid or blood. ? Pus or a bad smell. ? Warmth.  Do not take baths, swim, or use a hot tub until your health care provider approves.  You may shower 24-48 hours after the procedure, or as directed by your health care provider. ? Remove the dressing and gently wash the site with plain soap and water. ? Pat the area dry with a clean towel. ? Do not rub the site. That could cause bleeding.  Do not apply powder or lotion to the site. Activity   For 24 hours after the procedure, or as directed by your health care provider: ? Do not flex or bend the affected arm. ? Do not push or pull heavy objects with the affected arm. ? Do not  drive yourself home from the hospital or clinic. You may drive 24 hours after the procedure unless your health care provider tells you not to. ? Do not operate machinery or power tools.  Do not lift anything that is heavier than 10 lb (4.5 kg), or the limit that you are told, until your health care provider says that it is safe.  Ask your health care provider when it is okay to: ? Return to work or school. ? Resume usual physical activities or sports. ? Resume sexual activity. General instructions  If the catheter site starts to bleed, raise your arm and put firm pressure on the site. If the bleeding does not stop, get help right away. This is a medical emergency.  If you went home on the same day as your procedure, a responsible adult should be with you for the first 24 hours after you arrive home.  Keep all follow-up visits as told by your health care provider. This is important. Contact a health care provider if:  You have a fever.  You have redness, swelling, or yellow drainage around your insertion site. Get help right away if:  You have unusual pain at the radial site.  The catheter insertion area swells very fast.  The insertion area is bleeding, and the bleeding does not stop when you hold steady pressure on the area.  Your arm or hand becomes pale, cool, tingly, or numb. These symptoms may represent a serious problem   that is an emergency. Do not wait to see if the symptoms will go away. Get medical help right away. Call your local emergency services (911 in the [place] Do not drive yourself to the hospital. Summary  After the procedure, it is common to have bruising and tenderness at the site.  Follow instructions from your health care provider about how to take care of your radial site wound. Check the wound every day for signs of infection.  Do not lift anything that is heavier than 10 lb (4.5 kg), or the limit that you are told, until your health care provider says  that it is safe. This information is not intended to replace advice given to you by your health care provider. Make sure you discuss any questions you have with your health care provider. Document Revised: 06/12/2017 Document Reviewed: 06/12/2017 Elsevier Patient Education  [place] metformin for 48 hours post cath.    Information about your medication: Plavix (anti-platelet agent)  Generic Name (Brand): clopidogrel (Plavix), once daily medication  PURPOSE: You are taking this medication along with aspirin to lower your chance of having a heart attack, stroke, or blood clots in your heart stent. These can be fatal. Plavix and aspirin help prevent platelets from sticking together and forming a clot that can block an artery or your stent.   Common SIDE EFFECTS you may experience include: bruising or bleeding more easily, shortness of breath  Do not stop taking PLAVIX without talking to the doctor who prescribes it for you. People who are treated with a stent and stop taking Plavix too soon, have a higher risk of getting a blood clot in the stent, having a heart attack, or dying. If you stop Plavix because of bleeding, or for other reasons, your risk of a heart attack or stroke may increase.   Avoid taking NSAID agents or anti-inflammatory medications such as ibuprofen, naproxen given increased bleed risk with plavix - can use acetaminophen (Tylenol) if needed for pain.  Avoid taking over the counter stomach medications omeprazole (Prilosec) or esomeprazole (Nexium) since these do interact and make plavix less effective - ask your pharmacist or doctor for alterative agents if needed for heartburn or GERD.   Tell all of your doctors and dentists that you are taking Plavix. They should talk to the doctor who prescribed Plavix for you before you have any surgery or invasive procedure.   Contact your health care provider if you experience: severe or uncontrollable bleeding,  pink/red/brown urine, vomiting blood or vomit that looks like "coffee grounds", red or black stools (looks like tar), coughing up blood or blood clots ----------------------------------------------------------------------------------------------------------------------

## 2020-02-18 NOTE — Progress Notes (Signed)
[id number] Education completed with pt who voiced understanding. Reviewed importance of plavix with stent. Reviewed NTG use, walking for exercise, heart healthy and low carb foods, and CRP 2. Pt has attended [place] CRP 2 before. Will send referral letter. Graylon Good RN BSN 02/18/2020 11:18 AM

## 2020-02-18 NOTE — Research (Signed)
[place] Informed Consent   Subject Name: Peter Brooks  Subject met inclusion and exclusion criteria.  The informed consent form, study requirements and expectations were reviewed with the subject and questions and concerns were addressed prior to the signing of the consent form.  The subject verbalized understanding of the trail requirements.  The subject agreed to participate in the [facility] trial and signed the informed consent.  The informed consent was obtained prior to performance of any protocol-specific procedures for the subject.  A copy of the signed informed consent was given to the subject and a copy was placed in the subject's medical record.  Philemon Kingdom D 02/18/2020, [phone]

## 2020-02-18 NOTE — Progress Notes (Signed)
Discharge instructions reviewed with patient and family. Verbalized understanding. 

## 2020-02-18 NOTE — Interval H&P Note (Signed)
History and Physical Interval Note:  02/18/2020 7:38 AM  Peter Brooks  has presented today for surgery, with the diagnosis of unstable angina.  The various methods of treatment have been discussed with the patient and family. After consideration of risks, benefits and other options for treatment, the patient has consented to  Procedure(s): RIGHT/LEFT HEART CATH AND CORONARY ANGIOGRAPHY (N/A) as a surgical intervention.  The patient's history has been reviewed, patient examined, no change in status, stable for surgery.  I have reviewed the patient's chart and labs.  Questions were answered to the patient's satisfaction.    Cath Lab Visit (complete for each Cath Lab visit)  Clinical Evaluation Leading to the Procedure:   ACS: No.  Non-ACS:    Anginal Classification: CCS III  Anti-ischemic medical therapy: Maximal Therapy (2 or more classes of medications)  Non-Invasive Test Results: Low-risk stress test findings: cardiac mortality <1%/year  Prior CABG: No previous CABG        Lauree Chandler

## 2020-02-18 NOTE — H&P (Signed)
History of Present Illness: 74 yo male with history of CAD, chronic diastolic CHF, HTN, HLD, GERD and SVT here today for cardiac cath cath. Cardiac cath June 2010 with non-obstructive CAD and LVEF 60%.  He was seen in January 2015 and had c/o heart racing, dizziness, fatigue. Stress myoview 07/27/13 without ischemia. Echo 07/27/13 with normal LV function, no significant valve issues. Event monitor in 2015 without any arrythmias. He called our office in July 2019 with c/o palpitations. Cardiac monitor August 2019 with PVCs, PACs, short run of SVT and 4 beat run of non-sustained VT. Toprol was started. I saw him in the office in October 2019 and he c/o progressive dyspnea and chest pain with exertion. Echo October 2019 with normal LV systolic function. No significant valve disease. Cardiac cath 03/11/18 with severe stenosis in the right PDA treated with a drug eluting stent. Mild non-obstructive disease in the LAD and Circumflex. He was seen via telemedicine visit 01/19/19 and had c/o chest pain with exertion. Nuclear stress test 02/20/19 with no evidence of ischemia. Cardiac monitor September 2020 with sinus, several short runs of SVT. I saw him in May 2021 and he reported increased dyspnea and LE edema. Lasix was started. Echo 10/12/19 with LVEF=60-65%, no valve disease. Nuclear stress test August 2021 with no ischemia.  LE edema improved on Lasix 40 mg po BID.         Past Medical History:  Diagnosis Date  . Anxiety   . Arthritis   . CAD (coronary artery disease)    nonobstructive CAD with 40% narrowing in the proximal LAD   . Cataract    removed bilaterally   . Chest pain   . Diabetes mellitus without complication ([place])   . Dizziness   . GERD (gastroesophageal reflux disease)   . Hyperlipidemia   . Hypertension   . Hypothyroidism   . Lumbar herniated disc          Past Surgical History:  Procedure Laterality Date  . CARDIAC CATHETERIZATION  11/11/2008  . CATARACT EXTRACTION  Right   . CATARACT EXTRACTION Left   . COLONOSCOPY    . CORONARY STENT INTERVENTION N/A 03/11/2018   Procedure: CORONARY STENT INTERVENTION;  Surgeon: Martinique, Peter M, MD;  Location: [facility];  Service: Cardiovascular;  Laterality: N/A;  . KNEE ARTHROSCOPY Bilateral 2010 Right  . POLYPECTOMY    . RIGHT/LEFT HEART CATH AND CORONARY ANGIOGRAPHY N/A 03/11/2018   Procedure: RIGHT/LEFT HEART CATH AND CORONARY ANGIOGRAPHY;  Surgeon: Martinique, Peter M, MD;  Location: [facility];  Service: Cardiovascular;  Laterality: N/A;          Current Outpatient Medications  Medication Sig Dispense Refill  . amLODipine (NORVASC) 5 MG tablet Take 1 tablet by mouth once daily 90 tablet 1  . Ascorbic Acid (VITAMIN C) 1000 MG tablet Take 1,000 mg by mouth daily.    [name] Kitchen aspirin EC 81 MG tablet Take 81 mg by mouth every evening.     . Blood Glucose Monitoring Suppl (ONE TOUCH ULTRA 2) w/Device KIT See admin instructions.    . Calcium Carbonate-Vitamin D (CALTRATE 600+D) 600-400 MG-UNIT per tablet Take 1 tablet by mouth daily.    . clopidogrel (PLAVIX) 75 MG tablet Take 1 tablet by mouth once daily 90 tablet 1  . Coenzyme Q10 (COQ-10) 400 MG CAPS Take 400 mg by mouth daily.    . diazepam (VALIUM) 2 MG tablet Take 1 tablet (2 mg total) by mouth every 12 (twelve) hours  as needed for anxiety. 30 tablet 1  . docusate sodium (COLACE) 100 MG capsule Take 100 mg by mouth daily as needed for mild constipation.    . finasteride (PROSCAR) 5 MG tablet Take 5 mg by mouth daily.    . furosemide (LASIX) 40 MG tablet Take 1 tablet (40 mg total) by mouth 2 (two) times daily. For one week then go back to 40 mg once daily 180 tablet 3  . levothyroxine (SYNTHROID, LEVOTHROID) 50 MCG tablet Take 50 mcg by mouth daily.    . metFORMIN (GLUCOPHAGE-XR) 500 MG 24 hr tablet Take 500 mg by mouth 2 (two) times daily.    . metoprolol succinate (TOPROL XL) 100 MG 24 hr tablet Take 1 tablet (100 mg total)  by mouth daily. 90 tablet 3  . Multiple Vitamin (MULITIVITAMIN WITH MINERALS) TABS Take 1 tablet by mouth daily.    . Multiple Vitamins-Minerals (PRESERVISION AREDS 2) CAPS Take 1 capsule by mouth 2 (two) times daily.     . nitroGLYCERIN (NITROSTAT) 0.4 MG SL tablet Place 1 tablet (0.4 mg total) under the tongue every 5 (five) minutes as needed. 25 tablet 2  . ONETOUCH ULTRA test strip USE 1 STRIP TO CHECK GLUCOSE ONCE DAILY    . potassium chloride SA (KLOR-CON) 20 MEQ tablet Take 1 tablet (20 mEq total) by mouth daily. 90 tablet 3  . Probiotic Product (PROBIOTIC DAILY PO) Take 1 tablet by mouth daily.    . rosuvastatin (CRESTOR) 5 MG tablet Take 5 mg by mouth every evening.   0   No current facility-administered medications for this visit.    No Known Allergies  Social History        Socioeconomic History  . Marital status: Married    Spouse name: Not on file  . Number of children: Not on file  . Years of education: Not on file  . Highest education level: Not on file  Occupational History  . Not on file  Tobacco Use  . Smoking status: Never Smoker  . Smokeless tobacco: Never Used  Vaping Use  . Vaping Use: Never used  Substance and Sexual Activity  . Alcohol use: No  . Drug use: No  . Sexual activity: Yes    Comment: married  Other Topics Concern  . Not on file  Social History Narrative  . Not on file   Social Determinants of Health      Financial Resource Strain:   . Difficulty of Paying Living Expenses: Not on file  Food Insecurity:   . Worried About Charity fundraiser in the Last Year: Not on file  . Ran Out of Food in the Last Year: Not on file  Transportation Needs:   . Lack of Transportation (Medical): Not on file  . Lack of Transportation (Non-Medical): Not on file  Physical Activity:   . Days of Exercise per Week: Not on file  . Minutes of Exercise per Session: Not on file  Stress:   . Feeling of Stress : Not on file  Social  Connections:   . Frequency of Communication with Friends and Family: Not on file  . Frequency of Social Gatherings with Friends and Family: Not on file  . Attends Religious Services: Not on file  . Active Member of Clubs or Organizations: Not on file  . Attends Archivist Meetings: Not on file  . Marital Status: Not on file  Intimate Partner Violence:   . Fear of Current or Ex-Partner: Not  on file  . Emotionally Abused: Not on file  . Physically Abused: Not on file  . Sexually Abused: Not on file         Family History  Problem Relation Age of Onset  . Colon cancer Paternal Uncle 6  . Coronary artery disease Father        with CABG valve replacement in his 79's  . Heart attack Mother        in her 51's  . Esophageal cancer Neg Hx   . Rectal cancer Neg Hx   . Stomach cancer Neg Hx   . Colon polyps Neg Hx     Review of Systems:  As stated in the HPI and otherwise negative.   Blood pressure (!) 150/82, pulse 71, temperature 98.3 F (36.8 C), temperature source Oral, resp. rate 16, height '6\' 2"'  (1.88 m), weight 118.4 kg, SpO2 100 %.  Physical Examination:  General: Well developed, well nourished, NAD  HEENT: OP clear, mucus membranes moist  SKIN: warm, dry. No rashes. Neuro: No focal deficits  Musculoskeletal: Muscle strength 5/5 all ext  Psychiatric: Mood and affect normal  Neck: No JVD, no carotid bruits, no thyromegaly, no lymphadenopathy.  Lungs:Clear bilaterally, no wheezes, rhonci, crackles Cardiovascular: Regular rate and rhythm. No murmurs, gallops or rubs. Abdomen:Soft. Bowel sounds present. Non-tender.  Extremities: No lower extremity edema. Pulses are 2 + in the bilateral DP/PT.  Echo May 2021:  1. Left ventricular ejection fraction, by estimation, is 60 to 65%. The  left ventricle has normal function. The left ventricle has no regional  wall motion abnormalities. There is mild concentric left ventricular  hypertrophy. Left  ventricular diastolic  parameters are consistent with Grade I diastolic dysfunction (impaired  relaxation).  2. Right ventricular systolic function is normal. The right ventricular  size is normal. There is normal pulmonary artery systolic pressure. The  estimated right ventricular systolic pressure is 48.0 mmHg.  3. The mitral valve is normal in structure. No evidence of mitral valve  regurgitation. No evidence of mitral stenosis.  4. The aortic valve is tricuspid. Aortic valve regurgitation is not  visualized. Mild to moderate aortic valve sclerosis/calcification is  present, without any evidence of aortic stenosis.  5. The inferior vena cava is normal in size with greater than 50%  respiratory variability, suggesting right atrial pressure of 3 mmHg.   Assessment and Plan:   1. CAD with unstable angina: He is having chest pain and dyspnea with moderate exertion c/w unstable angina. Stress test low risk earlier this month but given symptoms, will repeat right and left heart cath to exclude progression of CAD and assess pressures.   2. HTN  3. HLD  4. PVCs/PACs/SVT  5. Chronic diastolic CHF: LV systolic function normal by echo in May 2021. No valve disease. Right heart cath to assess pressures given ongoing dyspnea.   Lauree Chandler 02/18/2020 7:38 AM

## 2020-02-18 NOTE — Discharge Summary (Signed)
Discharge Summary for Same Day PCI   Patient ID: Peter Brooks MRN: [id number]; DOB: 1946/01/20  Admit date: 02/18/2020 Discharge date: 02/18/2020  Primary Care Provider: Street, Sharon Mt, MD  Primary Cardiologist: Lauree Chandler, MD  Primary Electrophysiologist:  None   Discharge Diagnoses    Principal Problem:   Unstable angina [facility]) Active Problems:   Hyperlipidemia   Essential hypertension   CAD (coronary artery disease)   Type 2 diabetes mellitus with complication, without long-term current use of insulin ([place])   Hypothyroidism    Diagnostic Studies/Procedures    Right/Left Cardiac Catheterization 02/18/2020:  Previously placed RPDA stent (unknown type) is widely patent.  Prox RCA to Mid RCA lesion is 20% stenosed.  1st Mrg lesion is 90% stenosed.  1st Diag lesion is 50% stenosed.  Prox LAD to Mid LAD lesion is 50% stenosed.  Dist LAD lesion is 20% stenosed.  Ost Cx to Prox Cx lesion is 30% stenosed.  2nd Mrg lesion is 30% stenosed.  A drug-eluting stent was successfully placed using a SYNERGY XD 2.25X12.  Post intervention, there is a 0% residual stenosis.   1. Patent PDA stent 2. Moderate non-obstructive disease in the LAD and Diagonal branch 3. Severe stenosis first obtuse marginal branch.  4. Successful PTCA/DES x 1 first obtuse marginal branch 5. Normal right and left sided heart pressures  Recommendations: Continue DAPT with ASA and Plavix for at least six months. Same day post PCI discharge. Hold metformin for 48 hours post cath.  _____________   History of Present Illness     Peter Brooks is a 74 y.o. male with CAD s/p DES to right PDA in 32/6712, chronic diastolic CHF with EF of 45-80% on Echo in 09/2019, paroxysmal SVT, hypertension, hyperlipidemia, diabetes mellitus, hypothyroidism, and GERD who is followed by Dr. Angelena Form. Patient was recently seen by Dr. Angelena Form on 01/15/2020 at which time he continued to report dyspnea  and chest pain with moderate exertion. A recent nuclear stress test on 12/29/2019 had showed no evidence of ischemia. Symptoms were concerning for unstable angina; therefore, right/left cardiac catheterization was arranged for further evaluation.  Hospital Course     The patient underwent cardiac cath as noted above. Cath showed patent PDA stent with 90% stenosis of OM1. Otherwise, had moderate non-obstructive disease. Patient underwent successful PTCA/DES to OM1 lesion. Right and left sided heart pressures were normal. Plan is to continue dual antiplatelet therapy with Aspirin and Plavix (already on this at home) for at least 6 months. The patient was seen by Cardiac Rehab while in short stay. There were no observed complications post cath. Right radial cath site was re-evaluated prior to discharge and found to be stable without any complications. Instructions/precautions regarding cath site care were given prior to discharge.  Peter Brooks was seen by Dr. Angelena Form and determined stable for discharge home. Follow up with our office has been arranged. Medications are listed below. Continue DAPT with Aspirin and Plavix. Pertinent changes include increasing Crestor from 15m to 279mdaily. Will need repeat lipid panel and LFTs in 6-8 weeks. Will provide refills of Plavix and sublingual Nitro per patient's request. Can restart home Metformin 48 hours after cardiac cath.  _____________  Cath/PCI Registry Performance & Quality Measures: 1. Aspirin prescribed? - Yes 2. ADP Receptor Inhibitor (Plavix/Clopidogrel, Brilinta/Ticagrelor or Effient/Prasugrel) prescribed (includes medically managed patients)? - Yes 3. High Intensity Statin (Lipitor 40-8027mr Crestor 20-60m6mrescribed? - Yes 4. For EF <40%, was ACEI/ARB prescribed? - Not Applicable (EF >/=  40%) 5. For EF <40%, Aldosterone Antagonist (Spironolactone or Eplerenone) prescribed? - Not Applicable (EF >/= 51%) 6. Cardiac Rehab Phase II ordered  (Included Medically managed Patients)? - Yes  _____________   Discharge Vitals Blood pressure (!) 142/80, pulse 61, temperature 98.3 F (36.8 C), temperature source Oral, resp. rate 17, height 6' 2"  (1.88 m), weight 118.4 kg, SpO2 99 %.  Filed Weights   02/18/20 0700  Weight: 118.4 kg    Last Labs & Radiologic Studies    CBC Recent Labs    02/16/20 1136  WBC 8.3  HGB 14.0  HCT 43.7  MCV 90  PLT 884   Basic Metabolic Panel Recent Labs    02/16/20 1136  NA 139  K 4.6  CL 100  CO2 24  GLUCOSE 119*  BUN 14  CREATININE 1.29*  CALCIUM 9.9   Liver Function Tests No results for input(s): AST, ALT, ALKPHOS, BILITOT, PROT, ALBUMIN in the last 72 hours. No results for input(s): LIPASE, AMYLASE in the last 72 hours. High Sensitivity Troponin:   No results for input(s): TROPONINIHS in the last 720 hours.  BNP Invalid input(s): POCBNP D-Dimer No results for input(s): DDIMER in the last 72 hours. Hemoglobin A1C No results for input(s): HGBA1C in the last 72 hours. Fasting Lipid Panel No results for input(s): CHOL, HDL, LDLCALC, TRIG, CHOLHDL, LDLDIRECT in the last 72 hours. Thyroid Function Tests No results for input(s): TSH, T4TOTAL, T3FREE, THYROIDAB in the last 72 hours.  Invalid input(s): FREET3 _____________  CARDIAC CATHETERIZATION  Result Date: 02/18/2020  Previously placed RPDA stent (unknown type) is widely patent.  Prox RCA to Mid RCA lesion is 20% stenosed.  1st Mrg lesion is 90% stenosed.  1st Diag lesion is 50% stenosed.  Prox LAD to Mid LAD lesion is 50% stenosed.  Dist LAD lesion is 20% stenosed.  Ost Cx to Prox Cx lesion is 30% stenosed.  2nd Mrg lesion is 30% stenosed.  A drug-eluting stent was successfully placed using a SYNERGY XD 2.25X12.  Post intervention, there is a 0% residual stenosis.  1. Patent PDA stent 2. Moderate non-obstructive disease in the LAD and Diagonal branch 3. Severe stenosis first obtuse marginal branch. 4. Successful  PTCA/DES x 1 first obtuse marginal branch 5. Normal right and left sided heart pressures Recommendations: Continue DAPT with ASA and Plavix for at least six months. Same day post PCI discharge. Hold metformin for 48 hours post cath.    Disposition   Patient is being discharged home today in good condition.  Follow-up Plans & Appointments     Follow-up Information    Burnell Blanks, MD Follow up.   Specialty: Cardiology Why: Follow-up scheduled for 03/07/2020 at 8:20am. Please arrive 15 minutes early for check-in. Contact information: [address] [place] [address] [phone]              Discharge Instructions    Amb Referral to Cardiac Rehabilitation   Complete by: As directed    Referring to Short CRP 2   Diagnosis: Coronary Stents   After initial evaluation and assessments completed: Virtual Based Care may be provided alone or in conjunction with Phase 2 Cardiac Rehab based on patient barriers.: Yes   Diet - low sodium heart healthy   Complete by: As directed    Increase activity slowly   Complete by: As directed        Discharge Medications   Allergies as of 02/18/2020   No Known Allergies  Medication List    TAKE these medications   acetaminophen 500 MG tablet Commonly known as: TYLENOL Take 1,500 mg by mouth daily as needed for moderate pain or headache.   amLODipine 5 MG tablet Commonly known as: NORVASC Take 1 tablet by mouth once daily   aspirin EC 81 MG tablet Take 81 mg by mouth every evening.   Caltrate 600+D 600-400 MG-UNIT tablet Generic drug: Calcium Carbonate-Vitamin D Take 1 tablet by mouth daily.   clopidogrel 75 MG tablet Commonly known as: PLAVIX Take 1 tablet by mouth once daily   CoQ10 200 MG Caps Take 200 mg by mouth daily.   diazepam 2 MG tablet Commonly known as: VALIUM Take 1 tablet (2 mg total) by mouth every 12 (twelve) hours as needed for anxiety.   docusate sodium 100 MG  capsule Commonly known as: COLACE Take 100 mg by mouth daily as needed for mild constipation.   furosemide 40 MG tablet Commonly known as: LASIX Take 1 tablet (40 mg total) by mouth 2 (two) times daily. For one week then go back to 40 mg once daily What changed: additional instructions   levothyroxine 50 MCG tablet Commonly known as: SYNTHROID Take 50 mcg by mouth daily.   metFORMIN 500 MG tablet Commonly known as: GLUCOPHAGE Take 500 mg by mouth 2 (two) times daily.   metoprolol succinate 100 MG 24 hr tablet Commonly known as: Toprol XL Take 1 tablet (100 mg total) by mouth daily.   multivitamin with minerals Tabs tablet Take 1 tablet by mouth daily.   nitroGLYCERIN 0.4 MG SL tablet Commonly known as: Nitrostat Place 1 tablet (0.4 mg total) under the tongue every 5 (five) minutes as needed. What changed: reasons to take this   ONE TOUCH ULTRA 2 w/Device Kit See admin instructions.   OneTouch Ultra test strip Generic drug: glucose blood USE 1 STRIP TO CHECK GLUCOSE ONCE DAILY   potassium chloride SA 20 MEQ tablet Commonly known as: KLOR-CON Take 1 tablet (20 mEq total) by mouth daily. What changed: when to take this   PreserVision AREDS 2 Caps Take 1 capsule by mouth 2 (two) times daily.   PROBIOTIC DAILY PO Take 1 tablet by mouth daily.   rosuvastatin 5 MG tablet Commonly known as: CRESTOR Take 5 mg by mouth every evening.   vitamin C 1000 MG tablet Take 1,000 mg by mouth daily.          Allergies No Known Allergies  Outstanding Labs/Studies   N/A.  Duration of Discharge Encounter   Greater than 30 minutes including physician time.  Signed, Darreld Mclean, PA-C 02/18/2020, 11:16 AM

## 2020-02-19 ENCOUNTER — Encounter (HOSPITAL_COMMUNITY): Payer: Self-pay | Admitting: Cardiovascular Disease

## 2020-02-19 ENCOUNTER — Telehealth (HOSPITAL_COMMUNITY): Payer: Self-pay

## 2020-02-19 MED FILL — Nitroglycerin IV Soln 100 MCG/ML in D5W: INTRA_ARTERIAL | Qty: 10 | Status: AC

## 2020-02-19 NOTE — Telephone Encounter (Signed)
Faxed pt cardiac rehab referral to [facility] cardiac rehab

## 2020-02-29 DIAGNOSIS — Z6835 Body mass index (BMI) 35.0-35.9, adult: Secondary | ICD-10-CM | POA: Diagnosis not present

## 2020-02-29 DIAGNOSIS — Z79899 Other long term (current) drug therapy: Secondary | ICD-10-CM | POA: Diagnosis not present

## 2020-02-29 DIAGNOSIS — E1169 Type 2 diabetes mellitus with other specified complication: Secondary | ICD-10-CM | POA: Diagnosis not present

## 2020-02-29 DIAGNOSIS — I25119 Atherosclerotic heart disease of native coronary artery with unspecified angina pectoris: Secondary | ICD-10-CM | POA: Diagnosis not present

## 2020-02-29 DIAGNOSIS — E785 Hyperlipidemia, unspecified: Secondary | ICD-10-CM | POA: Diagnosis not present

## 2020-03-04 NOTE — Progress Notes (Signed)
Chief Complaint  Patient presents with  . Follow-up    CAD   History of Present Illness: 74 yo male with history of CAD, chronic diastolic CHF, HTN, HLD, GERD and SVT here today for cardiac follow up.  Cardiac cath June 2010 with non-obstructive CAD and LVEF 60%.  He was seen in January 2015 and had c/o heart racing, dizziness, fatigue. Stress myoview 07/27/13 without ischemia. Echo 07/27/13 with normal LV function, no significant valve issues. Event monitor in 2015 without any arrythmias. He called our office in July 2019 with c/o palpitations. Cardiac monitor August 2019 with PVCs, PACs, short run of SVT and 4 beat run of non-sustained VT. Toprol was started. I saw him in the office in October 2019 and he c/o progressive dyspnea and chest pain with exertion. Echo October 2019 with normal LV systolic function. No significant valve disease. Cardiac cath 03/11/18 with severe stenosis in the right PDA treated with a drug eluting stent. Mild non-obstructive disease in the LAD and Circumflex. He was seen via telemedicine visit 01/19/19 and had c/o chest pain with exertion. Nuclear stress test 02/20/19 with no evidence of ischemia. Cardiac monitor September 2020 with sinus, several short runs of SVT. I saw him in May 2021 and he reported increased dyspnea and LE edema. Lasix was started. Echo 10/12/19 with LVEF=60-65%, no valve disease. Nuclear stress test August 2021 with no ischemia. He continued to have chest pain and dyspnea so we arranged a cardiac cath on 02/18/20. This showed a patent right PDA stent, non-obstructive LAD disease and a severe stenosis in the first obtuse marginal branch which was treated with a drug eluting stent. Right heart cath with low filling pressures, PCWP 7, LVEDP 8.   He is here today for follow up. The patient denies any chest pain, dyspnea, palpitations, lower extremity edema, orthopnea, PND, dizziness, near syncope or syncope. Some leg pains. He thinks it is due to the higher dose of  the Crestor.   Primary Care Physician: Street, Sharon Mt, MD  Past Medical History:  Diagnosis Date  . Anxiety   . Arthritis   . CAD (coronary artery disease)    LHC 02/18/2020: prior stent to RPDA patent, 90% stenosis of OM1 s/p PTCA/DES, 50% 1st Diag, 50% proximal to mid LAD, 20% distal LAD, 30% ostial to proximal CX, 30% OM2.  . Cataract    removed bilaterally   . Chest pain   . Diabetes mellitus without complication ([place])   . Dizziness   . GERD (gastroesophageal reflux disease)   . Hyperlipidemia   . Hypertension   . Hypothyroidism   . Lumbar herniated disc     Past Surgical History:  Procedure Laterality Date  . CARDIAC CATHETERIZATION  11/11/2008  . CATARACT EXTRACTION Right   . CATARACT EXTRACTION Left   . COLONOSCOPY    . CORONARY STENT INTERVENTION N/A 03/11/2018   Procedure: CORONARY STENT INTERVENTION;  Surgeon: Martinique, Peter M, MD;  Location: [facility];  Service: Cardiovascular;  Laterality: N/A;  . CORONARY STENT INTERVENTION N/A 02/18/2020   Procedure: CORONARY STENT INTERVENTION;  Surgeon: Burnell Blanks, MD;  Location: [facility];  Service: Cardiovascular;  Laterality: N/A;  . KNEE ARTHROSCOPY Bilateral 2010 Right  . POLYPECTOMY    . RIGHT/LEFT HEART CATH AND CORONARY ANGIOGRAPHY N/A 03/11/2018   Procedure: RIGHT/LEFT HEART CATH AND CORONARY ANGIOGRAPHY;  Surgeon: Martinique, Peter M, MD;  Location: [facility];  Service: Cardiovascular;  Laterality: N/A;  . RIGHT/LEFT  HEART CATH AND CORONARY ANGIOGRAPHY N/A 02/18/2020   Procedure: RIGHT/LEFT HEART CATH AND CORONARY ANGIOGRAPHY;  Surgeon: Burnell Blanks, MD;  Location: [facility];  Service: Cardiovascular;  Laterality: N/A;    Current Outpatient Medications  Medication Sig Dispense Refill  . acetaminophen (TYLENOL) 500 MG tablet Take 1,500 mg by mouth daily as needed for moderate pain or headache.    [name] Kitchen amLODipine (NORVASC) 5 MG tablet Take 1 tablet by mouth once  daily (Patient taking differently: Take 5 mg by mouth daily. ) 90 tablet 3  . Ascorbic Acid (VITAMIN C) 1000 MG tablet Take 1,000 mg by mouth daily.    [name] Kitchen aspirin EC 81 MG tablet Take 81 mg by mouth every evening.     . Blood Glucose Monitoring Suppl (ONE TOUCH ULTRA 2) w/Device KIT See admin instructions.    . Calcium Carbonate-Vitamin D (CALTRATE 600+D) 600-400 MG-UNIT per tablet Take 1 tablet by mouth daily.    . clopidogrel (PLAVIX) 75 MG tablet Take 1 tablet (75 mg total) by mouth daily. 90 tablet 2  . Coenzyme Q10 (COQ10) 200 MG CAPS Take 200 mg by mouth daily.    . diazepam (VALIUM) 2 MG tablet Take 1 tablet (2 mg total) by mouth every 12 (twelve) hours as needed for anxiety. 30 tablet 1  . docusate sodium (COLACE) 100 MG capsule Take 100 mg by mouth daily as needed for mild constipation.    . furosemide (LASIX) 40 MG tablet Take 1 tablet (40 mg total) by mouth 2 (two) times daily. For one week then go back to 40 mg once daily (Patient taking differently: Take 40 mg by mouth 2 (two) times daily. ) 180 tablet 3  . levothyroxine (SYNTHROID, LEVOTHROID) 50 MCG tablet Take 50 mcg by mouth daily.    . metFORMIN (GLUCOPHAGE) 500 MG tablet Take 500 mg by mouth 2 (two) times daily.    . metoprolol succinate (TOPROL XL) 100 MG 24 hr tablet Take 1 tablet (100 mg total) by mouth daily. 90 tablet 3  . Multiple Vitamin (MULITIVITAMIN WITH MINERALS) TABS Take 1 tablet by mouth daily.    . Multiple Vitamins-Minerals (PRESERVISION AREDS 2) CAPS Take 1 capsule by mouth 2 (two) times daily.     . nitroGLYCERIN (NITROSTAT) 0.4 MG SL tablet Place 1 tablet (0.4 mg total) under the tongue every 5 (five) minutes as needed. 25 tablet 3  . ONETOUCH ULTRA test strip USE 1 STRIP TO CHECK GLUCOSE ONCE DAILY    . potassium chloride SA (KLOR-CON) 20 MEQ tablet Take 1 tablet (20 mEq total) by mouth daily. (Patient taking differently: Take 20 mEq by mouth 2 (two) times daily. ) 90 tablet 3  . Probiotic Product (PROBIOTIC  DAILY PO) Take 1 tablet by mouth daily.    . rosuvastatin (CRESTOR) 10 MG tablet Take 1 tablet (10 mg total) by mouth daily. 90 tablet 3   No current facility-administered medications for this visit.    No Known Allergies  Social History   Socioeconomic History  . Marital status: Married    Spouse name: Not on file  . Number of children: Not on file  . Years of education: Not on file  . Highest education level: Not on file  Occupational History  . Not on file  Tobacco Use  . Smoking status: Never Smoker  . Smokeless tobacco: Never Used  Vaping Use  . Vaping Use: Never used  Substance and Sexual Activity  . Alcohol use: No  .  Drug use: No  . Sexual activity: Yes    Comment: married  Other Topics Concern  . Not on file  Social History Narrative  . Not on file   Social Determinants of Health   Financial Resource Strain:   . Difficulty of Paying Living Expenses: Not on file  Food Insecurity:   . Worried About Charity fundraiser in the Last Year: Not on file  . Ran Out of Food in the Last Year: Not on file  Transportation Needs:   . Lack of Transportation (Medical): Not on file  . Lack of Transportation (Non-Medical): Not on file  Physical Activity:   . Days of Exercise per Week: Not on file  . Minutes of Exercise per Session: Not on file  Stress:   . Feeling of Stress : Not on file  Social Connections:   . Frequency of Communication with Friends and Family: Not on file  . Frequency of Social Gatherings with Friends and Family: Not on file  . Attends Religious Services: Not on file  . Active Member of Clubs or Organizations: Not on file  . Attends Archivist Meetings: Not on file  . Marital Status: Not on file  Intimate Partner Violence:   . Fear of Current or Ex-Partner: Not on file  . Emotionally Abused: Not on file  . Physically Abused: Not on file  . Sexually Abused: Not on file    Family History  Problem Relation Age of Onset  . Colon cancer  Paternal Uncle 30  . Coronary artery disease Father        with CABG valve replacement in his 47's  . Heart attack Mother        in her 7's  . Esophageal cancer Neg Hx   . Rectal cancer Neg Hx   . Stomach cancer Neg Hx   . Colon polyps Neg Hx     Review of Systems:  As stated in the HPI and otherwise negative.   BP 120/76   Pulse 68   Ht '6\' 2"'  (1.88 m)   Wt 262 lb (118.8 kg)   SpO2 97%   BMI 33.64 kg/m   Physical Examination:  General: Well developed, well nourished, NAD  HEENT: OP clear, mucus membranes moist  SKIN: warm, dry. No rashes. Neuro: No focal deficits  Musculoskeletal: Muscle strength 5/5 all ext  Psychiatric: Mood and affect normal  Neck: No JVD, no carotid bruits, no thyromegaly, no lymphadenopathy.  Lungs:Clear bilaterally, no wheezes, rhonci, crackles Cardiovascular: Regular rate and rhythm. No murmurs, gallops or rubs. Abdomen:Soft. Bowel sounds present. Non-tender.  Extremities: No lower extremity edema. Pulses are 2 + in the bilateral DP/PT.  Echo May 2021:  1. Left ventricular ejection fraction, by estimation, is 60 to 65%. The  left ventricle has normal function. The left ventricle has no regional  wall motion abnormalities. There is mild concentric left ventricular  hypertrophy. Left ventricular diastolic  parameters are consistent with Grade I diastolic dysfunction (impaired  relaxation).  2. Right ventricular systolic function is normal. The right ventricular  size is normal. There is normal pulmonary artery systolic pressure. The  estimated right ventricular systolic pressure is 09.3 mmHg.  3. The mitral valve is normal in structure. No evidence of mitral valve  regurgitation. No evidence of mitral stenosis.  4. The aortic valve is tricuspid. Aortic valve regurgitation is not  visualized. Mild to moderate aortic valve sclerosis/calcification is  present, without any evidence of aortic stenosis.  5.  The inferior vena cava is normal in  size with greater than 50%  respiratory variability, suggesting right atrial pressure of 3 mmHg.   EKG:  EKG is not ordered today. The ekg ordered today demonstrates   Recent Labs: 02/16/2020: BUN 14; Creatinine, Ser 1.29; Platelets 268 02/18/2020: Hemoglobin 13.3; Potassium 3.9; Sodium 144   Lipid Panel Followed in primary care   Wt Readings from Last 3 Encounters:  03/07/20 262 lb (118.8 kg)  02/18/20 261 lb 1.6 oz (118.4 kg)  01/15/20 267 lb (121.1 kg)     Other studies Reviewed: Additional studies/ records that were reviewed today include: . Review of the above records demonstrates:    Assessment and Plan:   1. CAD with without angina: Chest pain and dyspnea resolved post PCI/stenting. Will continue ASA, Plavix, beta blocker and statin.    2. HTN: BP is well controlled today. No changes in therapy  3. HLD:  Lipids followed in primary care. LDL 58 in January 2021. Continue statin but will reduce Crestor from 20 to 10 mg daily due to leg pains. Repeat lipids and LFTS in January 2022.   4. PVCs/PACs/SVT: Rare palpitations. Continue Toprol.   5. Chronic diastolic CHF: LV systolic function normal by echo in May 2021. No valve disease. Normal filling pressures by cath. Continue Lasix.   Current medicines are reviewed at length with the patient today.  The patient does not have concerns regarding medicines.  The following changes have been made:    Labs/ tests ordered today include:   Orders Placed This Encounter  Procedures  . Lipid panel  . Hepatic function panel    Disposition:   Follow up me in 6 months.   Signed, Lauree Chandler, MD 03/07/2020 9:43 AM    [facility] [address], [place], [place]  [address] Phone: [phone]; Fax: [phone]

## 2020-03-07 ENCOUNTER — Other Ambulatory Visit: Payer: Self-pay

## 2020-03-07 ENCOUNTER — Encounter: Payer: Self-pay | Admitting: Cardiovascular Disease

## 2020-03-07 ENCOUNTER — Ambulatory Visit: Payer: PPO | Admitting: Cardiovascular Disease

## 2020-03-07 VITALS — BP 120/76 | HR 68 | Ht 74.0 in | Wt 262.0 lb

## 2020-03-07 DIAGNOSIS — E785 Hyperlipidemia, unspecified: Secondary | ICD-10-CM | POA: Diagnosis not present

## 2020-03-07 DIAGNOSIS — I10 Essential (primary) hypertension: Secondary | ICD-10-CM | POA: Diagnosis not present

## 2020-03-07 DIAGNOSIS — I2510 Atherosclerotic heart disease of native coronary artery without angina pectoris: Secondary | ICD-10-CM | POA: Diagnosis not present

## 2020-03-07 DIAGNOSIS — I493 Ventricular premature depolarization: Secondary | ICD-10-CM | POA: Diagnosis not present

## 2020-03-07 DIAGNOSIS — I5032 Chronic diastolic (congestive) heart failure: Secondary | ICD-10-CM | POA: Diagnosis not present

## 2020-03-07 MED ORDER — ROSUVASTATIN CALCIUM 10 MG PO TABS: 10.0000 mg | ORAL_TABLET | Freq: Every day | ORAL | 3 refills | Status: DC

## 2020-03-07 NOTE — Patient Instructions (Addendum)
Medication Instructions:  Decrease Crestor (rosuvastatin) to 10 mg once daily.  *If you need a refill on your cardiac medications before your next appointment, please call your pharmacy*   Lab Work: In January 2022 - fasting lipids/liver function-   If you have labs (blood work) drawn today and your tests are completely normal, you will receive your results only by: [name] Kitchen MyChart Message (if you have MyChart) OR . A paper copy in the mail If you have any lab test that is abnormal or we need to change your treatment, we will call you to review the results.   Testing/Procedures: none   Follow-Up: At [facility], you and your health needs are our priority.  As part of our continuing mission to provide you with exceptional heart care, we have created designated Provider Care Teams.  These Care Teams include your primary Cardiologist (physician) and Advanced Practice Providers (APPs -  Physician Assistants and Nurse Practitioners) who all work together to provide you with the care you need, when you need it.  We recommend signing up for the patient portal called "MyChart".  Sign up information is provided on this After Visit Summary.  MyChart is used to connect with patients for Virtual Visits (Telemedicine).  Patients are able to view lab/test results, encounter notes, upcoming appointments, etc.  Non-urgent messages can be sent to your provider as well.   To learn more about what you can do with MyChart, go to [email or web address].    Your next appointment:   12 month(s)  The format for your next appointment:   In Person  Provider:   You may see Lauree Chandler, MD or one of the following Advanced Practice Providers on your designated Care Team:    Melina Copa, PA-C  Ermalinda Barrios, PA-C    Other Instructions

## 2020-03-09 DIAGNOSIS — Z23 Encounter for immunization: Secondary | ICD-10-CM | POA: Diagnosis not present

## 2020-03-17 ENCOUNTER — Telehealth: Payer: Self-pay | Admitting: Cardiovascular Disease

## 2020-03-17 MED ORDER — POTASSIUM CHLORIDE CRYS ER 20 MEQ PO TBCR: 20.0000 meq | EXTENDED_RELEASE_TABLET | Freq: Two times a day (BID) | ORAL | 3 refills | Status: DC

## 2020-03-17 MED ORDER — FUROSEMIDE 40 MG PO TABS: 40.0000 mg | ORAL_TABLET | Freq: Two times a day (BID) | ORAL | 3 refills | Status: DC

## 2020-03-17 NOTE — Telephone Encounter (Signed)
Spoke w patient. He was instructed at last ov to continue lasix 40 mg and potassium 20 meq bot twice daily.  The prescription was not up to date and so he ran out of meds early and was unable to get refilled.  His prescription has been updated at his pharmacy at this time.  Pt appreciative for assistance.

## 2020-03-17 NOTE — Telephone Encounter (Signed)
Patient states potassium chloride SA (KLOR-CON) 20 MEQ tablet and furosemide (LASIX) 40 MG tablet were prescribed incorrectly. He may need clarification on instructions (per note, "patient taking differently"). Please return call to discuss.

## 2020-06-01 ENCOUNTER — Other Ambulatory Visit: Payer: PPO

## 2020-06-01 ENCOUNTER — Other Ambulatory Visit: Payer: Self-pay

## 2020-06-01 DIAGNOSIS — E785 Hyperlipidemia, unspecified: Secondary | ICD-10-CM | POA: Diagnosis not present

## 2020-06-01 DIAGNOSIS — I493 Ventricular premature depolarization: Secondary | ICD-10-CM | POA: Diagnosis not present

## 2020-06-01 DIAGNOSIS — I10 Essential (primary) hypertension: Secondary | ICD-10-CM | POA: Diagnosis not present

## 2020-06-01 DIAGNOSIS — I5032 Chronic diastolic (congestive) heart failure: Secondary | ICD-10-CM | POA: Diagnosis not present

## 2020-06-01 DIAGNOSIS — I2510 Atherosclerotic heart disease of native coronary artery without angina pectoris: Secondary | ICD-10-CM | POA: Diagnosis not present

## 2020-06-01 LAB — LIPID PANEL
Chol/HDL Ratio: 2.9 ratio (ref 0.0–5.0)
Cholesterol, Total: 114 mg/dL (ref 100–199)
HDL: 39 mg/dL — ABNORMAL LOW (ref >39)
LDL Chol Calc (NIH): 52 mg/dL (ref 0–99)
Triglycerides: 128 mg/dL (ref 0–149)
VLDL Cholesterol Cal: 23 mg/dL (ref 5–40)

## 2020-06-01 LAB — HEPATIC FUNCTION PANEL
ALT: 15 IU/L (ref 0–44)
AST: 16 IU/L (ref 0–40)
Albumin: 4.5 g/dL (ref 3.7–4.7)
Alkaline Phosphatase: 71 IU/L (ref 44–121)
Bilirubin Total: 0.4 mg/dL (ref 0.0–1.2)
Bilirubin, Direct: 0.14 mg/dL (ref 0.00–0.40)
Total Protein: 7.2 g/dL (ref 6.0–8.5)

## 2020-06-24 ENCOUNTER — Encounter: Payer: Self-pay | Admitting: Physician Assistant

## 2020-07-07 ENCOUNTER — Ambulatory Visit: Payer: PPO | Admitting: Physician Assistant

## 2020-07-07 ENCOUNTER — Encounter: Payer: Self-pay | Admitting: Physician Assistant

## 2020-07-07 ENCOUNTER — Telehealth: Payer: Self-pay

## 2020-07-07 VITALS — BP 110/60 | HR 72 | Ht 74.0 in | Wt 269.0 lb

## 2020-07-07 DIAGNOSIS — R194 Change in bowel habit: Secondary | ICD-10-CM | POA: Diagnosis not present

## 2020-07-07 MED ORDER — NA SULFATE-K SULFATE-MG SULF 17.5-3.13-1.6 GM/177ML PO SOLN: 1.0000 | Freq: Once | ORAL | 0 refills | Status: AC

## 2020-07-07 NOTE — Progress Notes (Signed)
I agree with the above note, plan 

## 2020-07-07 NOTE — Telephone Encounter (Signed)
[facility] Pre-operative Risk Assessment     Request for surgical clearance:     Endoscopy Procedure  What type of surgery is being performed?     Colonoscopy  When is this surgery scheduled?     09/12/20  What type of clearance is required ?   Pharmacy  Are there any medications that need to be held prior to surgery and how long? Plavix 5 days  Practice name and name of physician performing surgery?      [facility]  What is your office phone and fax number?      Phone- [phone]  [phone]  Anesthesia type (None, local, MAC, general) ?       MAC

## 2020-07-07 NOTE — Patient Instructions (Signed)
If you are age 77 or older, your body mass index should be between 23-30. Your Body mass index is 34.54 kg/m. If this is out of the aforementioned range listed, please consider follow up with your Primary Care Provider.  If you are age 84 or younger, your body mass index should be between 19-25. Your Body mass index is 34.54 kg/m. If this is out of the aformentioned range listed, please consider follow up with your Primary Care Provider.   You have been scheduled for a colonoscopy. Please follow written instructions given to you at your visit today.  Please pick up your prep supplies at the pharmacy within the next 1-3 days. If you use inhalers (even only as needed), please bring them with you on the day of your procedure.  Thank you for choosing me and [facility].  Ellouise Newer, PA-C

## 2020-07-07 NOTE — Progress Notes (Signed)
Chief Complaint: Change in bowel habits  HPI:    Mr. Peter Brooks is a 75 year old male, known to Dr. Ardis Hughs, with a past medical history as listed below including CAD on Plavix (02/18/2020 cardiac cath with stent placement), who was referred to me by Street, Sharon Mt, * for a complaint of change in bowel habits.      02/21/2016 colonoscopy done for personal history of colon polyps with one 8 mm polyp in the proximal descending colon and otherwise normal.  Pathology showed hyperplastic and repeat was recommended in 5 years.    Today, patient explains that he feels like he has had a change in his bowel habits telling me that sometimes he will have a normal formed stool and then occasionally it will be looser and very occasionally watery which happens about once every 2 weeks.  He thinks it may correlate with what he is eating but it is making him nervous due to a family history of colon cancer in an uncle and some stomach cancer in his grandfather.  Denies any other complaints.    Denies fever, chills, blood in the stool or weight loss.  Past Medical History:  Diagnosis Date  . Anxiety   . Arthritis   . CAD (coronary artery disease)    LHC 02/18/2020: prior stent to RPDA patent, 90% stenosis of OM1 s/p PTCA/DES, 50% 1st Diag, 50% proximal to mid LAD, 20% distal LAD, 30% ostial to proximal CX, 30% OM2.  . Cataract    removed bilaterally   . Chest pain   . Diabetes mellitus without complication ([place])   . Dizziness   . GERD (gastroesophageal reflux disease)   . Hyperlipidemia   . Hypertension   . Hypothyroidism   . Lumbar herniated disc     Past Surgical History:  Procedure Laterality Date  . CARDIAC CATHETERIZATION  11/11/2008  . CATARACT EXTRACTION Right   . CATARACT EXTRACTION Left   . COLONOSCOPY    . CORONARY STENT INTERVENTION N/A 03/11/2018   Procedure: CORONARY STENT INTERVENTION;  Surgeon: Martinique, Peter M, MD;  Location: [facility];  Service: Cardiovascular;   Laterality: N/A;  . CORONARY STENT INTERVENTION N/A 02/18/2020   Procedure: CORONARY STENT INTERVENTION;  Surgeon: Burnell Blanks, MD;  Location: [facility];  Service: Cardiovascular;  Laterality: N/A;  . KNEE ARTHROSCOPY Bilateral 2010 Right  . POLYPECTOMY    . RIGHT/LEFT HEART CATH AND CORONARY ANGIOGRAPHY N/A 03/11/2018   Procedure: RIGHT/LEFT HEART CATH AND CORONARY ANGIOGRAPHY;  Surgeon: Martinique, Peter M, MD;  Location: [facility];  Service: Cardiovascular;  Laterality: N/A;  . RIGHT/LEFT HEART CATH AND CORONARY ANGIOGRAPHY N/A 02/18/2020   Procedure: RIGHT/LEFT HEART CATH AND CORONARY ANGIOGRAPHY;  Surgeon: Burnell Blanks, MD;  Location: [facility];  Service: Cardiovascular;  Laterality: N/A;    Current Outpatient Medications  Medication Sig Dispense Refill  . acetaminophen (TYLENOL) 500 MG tablet Take 1,500 mg by mouth daily as needed for moderate pain or headache.    [name] Kitchen amLODipine (NORVASC) 5 MG tablet Take 1 tablet by mouth once daily (Patient taking differently: Take 5 mg by mouth daily. ) 90 tablet 3  . Ascorbic Acid (VITAMIN C) 1000 MG tablet Take 1,000 mg by mouth daily.    [name] Kitchen aspirin EC 81 MG tablet Take 81 mg by mouth every evening.     . Blood Glucose Monitoring Suppl (ONE TOUCH ULTRA 2) w/Device KIT See admin instructions.    . Calcium Carbonate-Vitamin  D (CALTRATE 600+D) 600-400 MG-UNIT per tablet Take 1 tablet by mouth daily.    . clopidogrel (PLAVIX) 75 MG tablet Take 1 tablet (75 mg total) by mouth daily. 90 tablet 2  . Coenzyme Q10 (COQ10) 200 MG CAPS Take 200 mg by mouth daily.    . diazepam (VALIUM) 2 MG tablet Take 1 tablet (2 mg total) by mouth every 12 (twelve) hours as needed for anxiety. 30 tablet 1  . docusate sodium (COLACE) 100 MG capsule Take 100 mg by mouth daily as needed for mild constipation.    . furosemide (LASIX) 40 MG tablet Take 1 tablet (40 mg total) by mouth 2 (two) times daily. 180 tablet 3  . levothyroxine  (SYNTHROID, LEVOTHROID) 50 MCG tablet Take 50 mcg by mouth daily.    . metFORMIN (GLUCOPHAGE) 500 MG tablet Take 500 mg by mouth 2 (two) times daily.    . metoprolol succinate (TOPROL XL) 100 MG 24 hr tablet Take 1 tablet (100 mg total) by mouth daily. 90 tablet 3  . Multiple Vitamin (MULITIVITAMIN WITH MINERALS) TABS Take 1 tablet by mouth daily.    . Multiple Vitamins-Minerals (PRESERVISION AREDS 2) CAPS Take 1 capsule by mouth 2 (two) times daily.     . nitroGLYCERIN (NITROSTAT) 0.4 MG SL tablet Place 1 tablet (0.4 mg total) under the tongue every 5 (five) minutes as needed. 25 tablet 3  . ONETOUCH ULTRA test strip USE 1 STRIP TO CHECK GLUCOSE ONCE DAILY    . potassium chloride SA (KLOR-CON) 20 MEQ tablet Take 1 tablet (20 mEq total) by mouth 2 (two) times daily. 180 tablet 3  . Probiotic Product (PROBIOTIC DAILY PO) Take 1 tablet by mouth daily.    . rosuvastatin (CRESTOR) 10 MG tablet Take 1 tablet (10 mg total) by mouth daily. 90 tablet 3   No current facility-administered medications for this visit.    Allergies as of 07/07/2020  . (No Known Allergies)    Family History  Problem Relation Age of Onset  . Colon cancer Paternal Uncle 80  . Coronary artery disease Father        with CABG valve replacement in his 23's  . Heart attack Mother        in her 33's  . Esophageal cancer Neg Hx   . Rectal cancer Neg Hx   . Stomach cancer Neg Hx   . Colon polyps Neg Hx     Social History   Socioeconomic History  . Marital status: Married    Spouse name: Not on file  . Number of children: Not on file  . Years of education: Not on file  . Highest education level: Not on file  Occupational History  . Not on file  Tobacco Use  . Smoking status: Never Smoker  . Smokeless tobacco: Never Used  Vaping Use  . Vaping Use: Never used  Substance and Sexual Activity  . Alcohol use: No  . Drug use: No  . Sexual activity: Yes    Comment: married  Other Topics Concern  . Not on file   Social History Narrative  . Not on file   Social Determinants of Health   Financial Resource Strain: Not on file  Food Insecurity: Not on file  Transportation Needs: Not on file  Physical Activity: Not on file  Stress: Not on file  Social Connections: Not on file  Intimate Partner Violence: Not on file    Review of Systems:    Constitutional: No weight loss,  fever or chills Skin: No rash Cardiovascular: No chest pain  Respiratory: No SOB  Gastrointestinal: See HPI and otherwise negative Genitourinary: No dysuria  Neurological: No headache, dizziness or syncope Musculoskeletal: No new muscle or joint pain Hematologic: No bleeding  Psychiatric: No history of depression or anxiety   Physical Exam:  Vital signs: BP 110/60   Pulse 72   Ht 6' 2"  (1.88 m)   Wt 269 lb (122 kg)   BMI 34.54 kg/m   Constitutional:   Pleasant elderly Caucasian male appears to be in NAD, Well developed, Well nourished, alert and cooperative Head:  Normocephalic and atraumatic. Eyes:   PEERL, EOMI. No icterus. Conjunctiva pink. Ears:  Normal auditory acuity. Neck:  Supple Throat: Oral cavity and pharynx without inflammation, swelling or lesion.  Respiratory: Respirations even and unlabored. Lungs clear to auscultation bilaterally.   No wheezes, crackles, or rhonchi.  Cardiovascular: Normal S1, S2. No MRG. Regular rate and rhythm. No peripheral edema, cyanosis or pallor.  Gastrointestinal:  Soft, nondistended, nontender. No rebound or guarding. Normal bowel sounds. No appreciable masses or hepatomegaly. Rectal:  Not performed.  Msk:  Symmetrical without gross deformities. Without edema, no deformity or joint abnormality.  Neurologic:  Alert and  oriented x4;  grossly normal neurologically.  Skin:   Dry and intact without significant lesions or rashes. Psychiatric: Demonstrates good judgement and reason without abnormal affect or behaviors.  RELEVANT LABS AND IMAGING: CBC    Component Value  Date/Time   WBC 8.3 02/16/2020 1136   WBC 7.6 08/09/2011 0710   RBC 4.86 02/16/2020 1136   RBC 4.73 08/09/2011 0710   HGB 13.3 02/18/2020 0912   HGB 14.0 02/16/2020 1136   HCT 39.0 02/18/2020 0912   HCT 43.7 02/16/2020 1136   PLT 268 02/16/2020 1136   MCV 90 02/16/2020 1136   MCH 28.8 02/16/2020 1136   MCH 29.0 08/09/2011 0710   MCHC 32.0 02/16/2020 1136   MCHC 33.3 08/09/2011 0710   RDW 12.9 02/16/2020 1136   LYMPHSABS 1.9 11/08/2008 1013   MONOABS 0.6 11/08/2008 1013   EOSABS 0.2 11/08/2008 1013   BASOSABS 0.1 11/08/2008 1013    CMP     Component Value Date/Time   NA 144 02/18/2020 0912   NA 139 02/16/2020 1136   K 3.9 02/18/2020 0912   CL 100 02/16/2020 1136   CO2 24 02/16/2020 1136   GLUCOSE 119 (H) 02/16/2020 1136   GLUCOSE 117 (H) 08/09/2011 0710   BUN 14 02/16/2020 1136   CREATININE 1.29 (H) 02/16/2020 1136   CALCIUM 9.9 02/16/2020 1136   PROT 7.2 06/01/2020 0848   ALBUMIN 4.5 06/01/2020 0848   AST 16 06/01/2020 0848   ALT 15 06/01/2020 0848   ALKPHOS 71 06/01/2020 0848   BILITOT 0.4 06/01/2020 0848   GFRNONAA 54 (L) 02/16/2020 1136   GFRAA 62 02/16/2020 1136    Assessment: 1.  Change in bowel habits: Over the past few months, last colonoscopy almost 5 years ago with 1 hyperplastic polyp and otherwise normal  Plan: 1.  Scheduled patient for diagnostic colonoscopy due to change in bowel habits.  We will wait until April which would be over 6 months out from his last stent placement.  This is scheduled with Dr. Ardis Hughs in [facility].  Patient has had his COVID vaccines.  He was given a detailed list of risks for the procedure and he agrees to proceed. 2.  Patient was advised to hold his Plavix for 5 days prior to time of  the procedure.  We will communicate with his prescribing physician to ensure this is acceptable for him. 3.  Encouraged patient to start a fiber supplement in addition to his probiotic daily which may help with some of his variance. 4.  Patient to  follow in clinic per recommendations from Dr. Ardis Hughs after time of procedure.  Ellouise Newer, PA-C [facility] 07/07/2020, 11:19 AM  Cc: Street, Sharon Mt, *

## 2020-07-08 NOTE — Telephone Encounter (Signed)
   Primary Cardiologist: Lauree Chandler, MD  Chart reviewed as part of pre-operative protocol coverage.[name] had a history of coronary artery disease, HTN, HLD, HFpEF. Last seen 03/07/20 by Dr. Angelena Form and doing well from cardiac perspective.   Most recent cardiac cath 02/18/20 with patent PDA stent and successful PTCA/DESx1 to first obtuse marginal branch. Recommended for ASA and Plavix for at least 6 months. Endoscopy is scheduled for 09/12/20 which is >6 months from stent. Will route to primary cardiologist for his review.   Loel Dubonnet, NP 07/08/2020, 10:57 AM

## 2020-07-08 NOTE — Telephone Encounter (Signed)
Called to speak with patient. Tells me his colonoscopy has been moved to September due to insurance reasons. Anticipate new clearance will be received at that time. He is due for annual follow up with our office 02/2021 and recall is in place. Will route to requesting party so they are aware and remove from preop pool.   Peter Dubonnet, NP

## 2020-07-08 NOTE — Telephone Encounter (Signed)
OK to hold ASA and Plavix for his procedure. Thanks, Therapist, sports

## 2020-08-10 DIAGNOSIS — E1169 Type 2 diabetes mellitus with other specified complication: Secondary | ICD-10-CM | POA: Diagnosis not present

## 2020-08-10 DIAGNOSIS — J324 Chronic pansinusitis: Secondary | ICD-10-CM | POA: Diagnosis not present

## 2020-08-10 DIAGNOSIS — E785 Hyperlipidemia, unspecified: Secondary | ICD-10-CM | POA: Diagnosis not present

## 2020-08-10 DIAGNOSIS — Z6836 Body mass index (BMI) 36.0-36.9, adult: Secondary | ICD-10-CM | POA: Diagnosis not present

## 2020-08-10 DIAGNOSIS — Z20828 Contact with and (suspected) exposure to other viral communicable diseases: Secondary | ICD-10-CM | POA: Diagnosis not present

## 2020-08-13 DIAGNOSIS — J029 Acute pharyngitis, unspecified: Secondary | ICD-10-CM | POA: Diagnosis not present

## 2020-08-13 DIAGNOSIS — J0100 Acute maxillary sinusitis, unspecified: Secondary | ICD-10-CM | POA: Diagnosis not present

## 2020-08-22 NOTE — H&P (View-Only) (Signed)
Cardiology Office Note:    Date:  08/23/2020   ID:  [name], DOB 03-11-1946, MRN [id number]  PCP:  Street, Sharon Mt, MD   [place]  Cardiologist:  Lauree Chandler, MD   Electrophysiologist:  None       Referring MD: Street, Sharon Mt, *   Chief Complaint:  Shortness of Breath    Patient Profile:     Peter Brooks is a 75 y.o. male with:   Coronary artery disease [anginal equiv - CP/SOB]  S/p DES to R PDA in 10/19  Myoview 8/21: no ischemia; low risk  S/p DES to OM1 in 9/21  (HFpEF) heart failure with preserved ejection fraction   Echo 5/21: EF 60-65 mild LVH, Gr 1 DD, RVSP 31.1, AV sclerosis without stenosis  Hypertension   Hyperlipidemia   SVT  GERD  Diabetes mellitus   Hypothyroidism  Prior CV studies: RIGHT/LEFT HEART CATH 02/18/2020 RCA proximal-mid 20; RPDA stent patent LCx ostial-proximal 30; OM1 90, OM2 30 LAD proximal to mid 90, distal 20 PCI:  SYNERGY XD 2.25X12 to the OM1   GATED SPECT MYO PERF W/EXERCISE STRESS 1D 12/29/2019 Narrative  Nuclear stress EF: 65%.  Blood pressure demonstrated a normal response to exercise.  There was no ST segment deviation noted during stress.  Defect 1: There is a medium defect of mild severity present in the basal inferior and mid inferior location.  The study is normal.  This is a low risk study.  The left ventricular ejection fraction is normal (55-65%). Normal stress nuclear study with inferior thinning but no ischemia.  Gated ejection fraction 65% with normal wall motion.   ECHOCARDIOGRAM 10/12/19 EF 60-65, no RWMA, mild LVH, GR 1 DD, normal RVSF, RVSP 31.1, AV sclerosis without stenosis   LONG TERM MONITOR (3-7 DAYS) INTERPRETATION 02/23/2019 Narrative Sinus rhythm 20 episodes of supraventricular tachycardia. The longest episode lasted 1 minute, 11 seconds. Most of the episodes were only for a few seconds. Rare premature ventricular  contractions      History of Present Illness:    Peter Brooks was last seen by Dr. Angelena Form in 10/21.  He is to have colonoscopy soon and will need to hold ASA and Plavix.  He has been short of breath with exertion for the past month.  He got the flu 3 weeks ago and recovered.  He had an allergic reaction to cefdinir.  He was seen by a NP and was told he has a murmur and should f/u here.  He feels his shortness of breath is similar to the shortness of breath he had before his prior PCI.  He has not really had chest pain.  He has noted a fast HR after minimal activity.  He has not had significant wt gain.  He has not had orthopnea, paroxysmal nocturnal dyspnea, leg edema.  He has not had syncope.  He does not symptoms of vertigo with lying flat.           Past Medical History:  Diagnosis Date  . Anxiety   . Arthritis   . CAD (coronary artery disease)    LHC 02/18/2020: prior stent to RPDA patent, 90% stenosis of OM1 s/p PTCA/DES, 50% 1st Diag, 50% proximal to mid LAD, 20% distal LAD, 30% ostial to proximal CX, 30% OM2.  . Cataract    removed bilaterally   . Chest pain   . Diabetes mellitus without complication ([place])   . Dizziness   . GERD (gastroesophageal  reflux disease)   . Hyperlipidemia   . Hypertension   . Hypothyroidism   . Lumbar herniated disc     Current Medications: Current Meds  Medication Sig  . amLODipine (NORVASC) 5 MG tablet Take 1 tablet by mouth once daily  . Ascorbic Acid (VITAMIN C) 1000 MG tablet Take 1,000 mg by mouth daily.  [name] Kitchen aspirin EC 81 MG tablet Take 81 mg by mouth every evening.   . Blood Glucose Monitoring Suppl (ONE TOUCH ULTRA 2) w/Device KIT See admin instructions.  . Calcium Carbonate-Vitamin D 600-400 MG-UNIT tablet Take 1 tablet by mouth daily.  . clopidogrel (PLAVIX) 75 MG tablet Take 1 tablet (75 mg total) by mouth daily.  . Coenzyme Q10 (COQ10) 200 MG CAPS Take 200 mg by mouth daily.  . diazepam (VALIUM) 2 MG tablet Take 1 tablet (2 mg total) by  mouth every 12 (twelve) hours as needed for anxiety.  . docusate sodium (COLACE) 100 MG capsule Take 100 mg by mouth daily as needed for mild constipation.  . furosemide (LASIX) 40 MG tablet Take 1 tablet (40 mg total) by mouth 2 (two) times daily.  [name] Kitchen levothyroxine (SYNTHROID, LEVOTHROID) 50 MCG tablet Take 50 mcg by mouth daily.  . metFORMIN (GLUCOPHAGE) 500 MG tablet Take 500 mg by mouth 2 (two) times daily.  . metoprolol succinate (TOPROL XL) 100 MG 24 hr tablet Take 1 tablet (100 mg total) by mouth daily.  . Multiple Vitamin (MULITIVITAMIN WITH MINERALS) TABS Take 1 tablet by mouth daily.  . Multiple Vitamins-Minerals (PRESERVISION AREDS 2) CAPS Take 1 capsule by mouth 2 (two) times daily.   . nitroGLYCERIN (NITROSTAT) 0.4 MG SL tablet Place 1 tablet (0.4 mg total) under the tongue every 5 (five) minutes as needed.  Glory Rosebush ULTRA test strip USE 1 STRIP TO CHECK GLUCOSE ONCE DAILY  . potassium chloride SA (KLOR-CON) 20 MEQ tablet Take 1 tablet (20 mEq total) by mouth 2 (two) times daily.  . Probiotic Product (PROBIOTIC DAILY PO) Take 1 tablet by mouth daily.  . rosuvastatin (CRESTOR) 10 MG tablet Take 10 mg by mouth daily.     Allergies:   Cefdinir   Social History   Tobacco Use  . Smoking status: Never Smoker  . Smokeless tobacco: Never Used  Vaping Use  . Vaping Use: Never used  Substance Use Topics  . Alcohol use: No  . Drug use: No     Family Hx: The patient's family history includes Colon cancer (age of onset: 18) in his paternal uncle; Coronary artery disease in his father; Heart attack in his mother; Stomach cancer in his paternal grandmother. There is no history of Esophageal cancer, Rectal cancer, or Colon polyps.  Review of Systems  Constitutional: Negative for fever.  Respiratory: Negative for cough.   Gastrointestinal: Negative for diarrhea, hematochezia, melena and vomiting.  Genitourinary: Negative for hematuria.  All other systems reviewed and are negative.     EKGs/Labs/Other Test Reviewed:    EKG:  EKG is   ordered today.  The ekg ordered today demonstrates normal sinus rhythm, HR 64, LAD, inc RBBB, no ST-TW changes, QTc 439 ms  Recent Labs: 06/01/2020: ALT 15 08/23/2020: BUN 16; Creatinine, Ser 1.15; Hemoglobin 14.5; NT-Pro BNP WILL FOLLOW; Platelets 295; Potassium 4.9; Sodium 140   Recent Lipid Panel Lab Results  Component Value Date/Time   CHOL 114 06/01/2020 08:48 AM   TRIG 128 06/01/2020 08:48 AM   HDL 39 (L) 06/01/2020 08:48 AM   CHOLHDL 2.9 06/01/2020  08:48 AM   CHOLHDL 3.6 08/09/2011 07:10 AM   LDLCALC 52 06/01/2020 08:48 AM      Risk Assessment/Calculations:      Physical Exam:    VS:  BP 110/74   Pulse 67   Ht '6\' 2"'  (1.88 m)   Wt 258 lb 6.4 oz (117.2 kg)   SpO2 97%   BMI 33.18 kg/m     Wt Readings from Last 3 Encounters:  08/23/20 258 lb 6.4 oz (117.2 kg)  07/07/20 269 lb (122 kg)  03/07/20 262 lb (118.8 kg)     Constitutional:      Appearance: Healthy appearance. Not in distress.  Neck:     Vascular: No JVR. JVD normal.  Pulmonary:     Effort: Pulmonary effort is normal.     Breath sounds: No wheezing. No rales.  Cardiovascular:     Normal rate. Regular rhythm. Normal S1. Normal S2.     Murmurs: There is no murmur.  Edema:    Ankle: bilateral trace edema of the ankle. Abdominal:     Palpations: Abdomen is soft. There is no hepatomegaly.  Skin:    General: Skin is warm and dry.  Neurological:     General: No focal deficit present.     Mental Status: Alert and oriented to person, place and time.     Cranial Nerves: Cranial nerves are intact.          ASSESSMENT & PLAN:    1. Coronary artery disease involving native coronary artery of native heart with angina pectoris ([place]) 2. Shortness of breath History of prior stenting to the RPDA in 2019 and most recently DES to the [place] in 9/21.  He has had recurrent symptoms of shortness of breath with exertion similar to his previous angina.  Of note, he  had a Myoview prior to his last PCI that was normal.  His electrocardiogram does not demonstrate any acute changes.  He is in need of a colonoscopy and will need to come off of aspirin & Plavix for the procedure.  As his symptoms are reminiscent of his previous angina, I recommended proceeding with right and left cardiac catheterization.  I reviewed this with Dr. Radford Pax (attending MD) who agreed.  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  He may need to reschedule the procedure.  Continue current dose of aspirin, clopidogrel, metoprolol succinate, amlodipine, rosuvastatin.  3. Chronic heart failure with preserved ejection fraction (HCC) Overall, volume status appears stable.  Obtain BNP along with labs today.  Continue current dose of furosemide.  4. Essential hypertension, benign The patient's blood pressure is controlled on his current regimen.  Continue current therapy.   5. Mixed hyperlipidemia LDL optimal on most recent lab work.  Continue current Rx.    6. Aortic valve sclerosis He was seen by nurse practitioner recently who detected a cardiac murmur.  His last echocardiogram in 5/21 demonstrated mild to moderate aortic valve sclerosis but no stenosis.    Shared Decision Making/Informed Consent The risks [stroke (1 in 1000), death (1 in 1000), kidney failure [usually temporary] (1 in 500), bleeding (1 in 200), allergic reaction [possibly serious] (1 in 200)], benefits (diagnostic support and management of coronary artery disease) and alternatives of a cardiac catheterization were discussed in detail with Mr. Devaul and he is willing to proceed.    Dispo:  Return in about 2 weeks (around 09/06/2020) for Post  Procedure Follow Up.   Medication Adjustments/Labs and Tests Ordered: Current medicines are reviewed at length with the patient today.  Concerns regarding medicines are outlined above.  Tests Ordered: Orders Placed  This Encounter  Procedures  . Basic Metabolic Panel (BMET)  . CBC  . Pro b natriuretic peptide  . EKG 12-Lead   Medication Changes: No orders of the defined types were placed in this encounter.   Signed, Richardson Dopp, PA-C  08/23/2020 4:39 PM    [facility] [place], [place], [place]  [address] Phone: [phone]; Fax: [phone]

## 2020-08-22 NOTE — Progress Notes (Addendum)
Cardiology Office Note:    Date:  08/23/2020   ID:  Peter Brooks, DOB 22-Apr-1946, MRN [id number]  PCP:  Street, Sharon Mt, MD   [place]  Cardiologist:  Lauree Chandler, MD   Electrophysiologist:  None       Referring MD: Street, Sharon Mt, *   Chief Complaint:  Shortness of Breath    Patient Profile:     Peter Brooks is a 75 y.o. male with:   Coronary artery disease [anginal equiv - CP/SOB]  S/p DES to R PDA in 10/19  Myoview 8/21: no ischemia; low risk  S/p DES to OM1 in 9/21  (HFpEF) heart failure with preserved ejection fraction   Echo 5/21: EF 60-65 mild LVH, Gr 1 DD, RVSP 31.1, AV sclerosis without stenosis  Hypertension   Hyperlipidemia   SVT  GERD  Diabetes mellitus   Hypothyroidism  Prior CV studies: RIGHT/LEFT HEART CATH 02/18/2020 RCA proximal-mid 20; RPDA stent patent LCx ostial-proximal 30; OM1 90, OM2 30 LAD proximal to mid 12, distal 20 PCI:  SYNERGY XD 2.25X12 to the OM1   GATED SPECT MYO PERF W/EXERCISE STRESS 1D 12/29/2019 Narrative  Nuclear stress EF: 65%.  Blood pressure demonstrated a normal response to exercise.  There was no ST segment deviation noted during stress.  Defect 1: There is a medium defect of mild severity present in the basal inferior and mid inferior location.  The study is normal.  This is a low risk study.  The left ventricular ejection fraction is normal (55-65%). Normal stress nuclear study with inferior thinning but no ischemia.  Gated ejection fraction 65% with normal wall motion.   ECHOCARDIOGRAM 10/12/19 EF 60-65, no RWMA, mild LVH, GR 1 DD, normal RVSF, RVSP 31.1, AV sclerosis without stenosis   LONG TERM MONITOR (3-7 DAYS) INTERPRETATION 02/23/2019 Narrative Sinus rhythm 20 episodes of supraventricular tachycardia. The longest episode lasted 1 minute, 11 seconds. Most of the episodes were only for a few seconds. Rare premature ventricular  contractions      History of Present Illness:    Mr. Peter Brooks was last seen by Dr. Angelena Form in 10/21.  He is to have colonoscopy soon and will need to hold ASA and Plavix.  He has been short of breath with exertion for the past month.  He got the flu 3 weeks ago and recovered.  He had an allergic reaction to cefdinir.  He was seen by a NP and was told he has a murmur and should f/u here.  He feels his shortness of breath is similar to the shortness of breath he had before his prior PCI.  He has not really had chest pain.  He has noted a fast HR after minimal activity.  He has not had significant wt gain.  He has not had orthopnea, paroxysmal nocturnal dyspnea, leg edema.  He has not had syncope.  He does not symptoms of vertigo with lying flat.           Past Medical History:  Diagnosis Date  . Anxiety   . Arthritis   . CAD (coronary artery disease)    LHC 02/18/2020: prior stent to RPDA patent, 90% stenosis of OM1 s/p PTCA/DES, 50% 1st Diag, 50% proximal to mid LAD, 20% distal LAD, 30% ostial to proximal CX, 30% OM2.  . Cataract    removed bilaterally   . Chest pain   . Diabetes mellitus without complication ([place])   . Dizziness   . GERD (gastroesophageal  reflux disease)   . Hyperlipidemia   . Hypertension   . Hypothyroidism   . Lumbar herniated disc     Current Medications: Current Meds  Medication Sig  . amLODipine (NORVASC) 5 MG tablet Take 1 tablet by mouth once daily  . Ascorbic Acid (VITAMIN C) 1000 MG tablet Take 1,000 mg by mouth daily.  [name] Kitchen aspirin EC 81 MG tablet Take 81 mg by mouth every evening.   . Blood Glucose Monitoring Suppl (ONE TOUCH ULTRA 2) w/Device KIT See admin instructions.  . Calcium Carbonate-Vitamin D 600-400 MG-UNIT tablet Take 1 tablet by mouth daily.  . clopidogrel (PLAVIX) 75 MG tablet Take 1 tablet (75 mg total) by mouth daily.  . Coenzyme Q10 (COQ10) 200 MG CAPS Take 200 mg by mouth daily.  . diazepam (VALIUM) 2 MG tablet Take 1 tablet (2 mg total) by  mouth every 12 (twelve) hours as needed for anxiety.  . docusate sodium (COLACE) 100 MG capsule Take 100 mg by mouth daily as needed for mild constipation.  . furosemide (LASIX) 40 MG tablet Take 1 tablet (40 mg total) by mouth 2 (two) times daily.  [name] Kitchen levothyroxine (SYNTHROID, LEVOTHROID) 50 MCG tablet Take 50 mcg by mouth daily.  . metFORMIN (GLUCOPHAGE) 500 MG tablet Take 500 mg by mouth 2 (two) times daily.  . metoprolol succinate (TOPROL XL) 100 MG 24 hr tablet Take 1 tablet (100 mg total) by mouth daily.  . Multiple Vitamin (MULITIVITAMIN WITH MINERALS) TABS Take 1 tablet by mouth daily.  . Multiple Vitamins-Minerals (PRESERVISION AREDS 2) CAPS Take 1 capsule by mouth 2 (two) times daily.   . nitroGLYCERIN (NITROSTAT) 0.4 MG SL tablet Place 1 tablet (0.4 mg total) under the tongue every 5 (five) minutes as needed.  Glory Rosebush ULTRA test strip USE 1 STRIP TO CHECK GLUCOSE ONCE DAILY  . potassium chloride SA (KLOR-CON) 20 MEQ tablet Take 1 tablet (20 mEq total) by mouth 2 (two) times daily.  . Probiotic Product (PROBIOTIC DAILY PO) Take 1 tablet by mouth daily.  . rosuvastatin (CRESTOR) 10 MG tablet Take 10 mg by mouth daily.     Allergies:   Cefdinir   Social History   Tobacco Use  . Smoking status: Never Smoker  . Smokeless tobacco: Never Used  Vaping Use  . Vaping Use: Never used  Substance Use Topics  . Alcohol use: No  . Drug use: No     Family Hx: The patient's family history includes Colon cancer (age of onset: 6) in his paternal uncle; Coronary artery disease in his father; Heart attack in his mother; Stomach cancer in his paternal grandmother. There is no history of Esophageal cancer, Rectal cancer, or Colon polyps.  Review of Systems  Constitutional: Negative for fever.  Respiratory: Negative for cough.   Gastrointestinal: Negative for diarrhea, hematochezia, melena and vomiting.  Genitourinary: Negative for hematuria.  All other systems reviewed and are negative.     EKGs/Labs/Other Test Reviewed:    EKG:  EKG is   ordered today.  The ekg ordered today demonstrates normal sinus rhythm, HR 64, LAD, inc RBBB, no ST-TW changes, QTc 439 ms  Recent Labs: 06/01/2020: ALT 15 08/23/2020: BUN 16; Creatinine, Ser 1.15; Hemoglobin 14.5; NT-Pro BNP WILL FOLLOW; Platelets 295; Potassium 4.9; Sodium 140   Recent Lipid Panel Lab Results  Component Value Date/Time   CHOL 114 06/01/2020 08:48 AM   TRIG 128 06/01/2020 08:48 AM   HDL 39 (L) 06/01/2020 08:48 AM   CHOLHDL 2.9 06/01/2020  08:48 AM   CHOLHDL 3.6 08/09/2011 07:10 AM   LDLCALC 52 06/01/2020 08:48 AM      Risk Assessment/Calculations:      Physical Exam:    VS:  BP 110/74   Pulse 67   Ht '6\' 2"'  (1.88 m)   Wt 258 lb 6.4 oz (117.2 kg)   SpO2 97%   BMI 33.18 kg/m     Wt Readings from Last 3 Encounters:  08/23/20 258 lb 6.4 oz (117.2 kg)  07/07/20 269 lb (122 kg)  03/07/20 262 lb (118.8 kg)     Constitutional:      Appearance: Healthy appearance. Not in distress.  Neck:     Vascular: No JVR. JVD normal.  Pulmonary:     Effort: Pulmonary effort is normal.     Breath sounds: No wheezing. No rales.  Cardiovascular:     Normal rate. Regular rhythm. Normal S1. Normal S2.     Murmurs: There is no murmur.  Edema:    Ankle: bilateral trace edema of the ankle. Abdominal:     Palpations: Abdomen is soft. There is no hepatomegaly.  Skin:    General: Skin is warm and dry.  Neurological:     General: No focal deficit present.     Mental Status: Alert and oriented to person, place and time.     Cranial Nerves: Cranial nerves are intact.          ASSESSMENT & PLAN:    1. Coronary artery disease involving native coronary artery of native heart with angina pectoris ([place]) 2. Shortness of breath History of prior stenting to the RPDA in 2019 and most recently DES to the [place] in 9/21.  He has had recurrent symptoms of shortness of breath with exertion similar to his previous angina.  Of note, he  had a Myoview prior to his last PCI that was normal.  His electrocardiogram does not demonstrate any acute changes.  He is in need of a colonoscopy and will need to come off of aspirin & Plavix for the procedure.  As his symptoms are reminiscent of his previous angina, I recommended proceeding with right and left cardiac catheterization.  I reviewed this with Dr. Radford Pax (attending MD) who agreed.  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  He may need to reschedule the procedure.  Continue current dose of aspirin, clopidogrel, metoprolol succinate, amlodipine, rosuvastatin.  3. Chronic heart failure with preserved ejection fraction (HCC) Overall, volume status appears stable.  Obtain BNP along with labs today.  Continue current dose of furosemide.  4. Essential hypertension, benign The patient's blood pressure is controlled on his current regimen.  Continue current therapy.   5. Mixed hyperlipidemia LDL optimal on most recent lab work.  Continue current Rx.    6. Aortic valve sclerosis He was seen by nurse practitioner recently who detected a cardiac murmur.  His last echocardiogram in 5/21 demonstrated mild to moderate aortic valve sclerosis but no stenosis.    Shared Decision Making/Informed Consent The risks [stroke (1 in 1000), death (1 in 1000), kidney failure [usually temporary] (1 in 500), bleeding (1 in 200), allergic reaction [possibly serious] (1 in 200)], benefits (diagnostic support and management of coronary artery disease) and alternatives of a cardiac catheterization were discussed in detail with Mr. Shaddock and he is willing to proceed.    Dispo:  Return in about 2 weeks (around 09/06/2020) for Post  Procedure Follow Up.   Medication Adjustments/Labs and Tests Ordered: Current medicines are reviewed at length with the patient today.  Concerns regarding medicines are outlined above.  Tests Ordered: Orders Placed  This Encounter  Procedures  . Basic Metabolic Panel (BMET)  . CBC  . Pro b natriuretic peptide  . EKG 12-Lead   Medication Changes: No orders of the defined types were placed in this encounter.   Signed, Richardson Dopp, PA-C  08/23/2020 4:39 PM    [facility] [place], [place], [place]  [address] Phone: [phone]; Fax: [phone]

## 2020-08-23 ENCOUNTER — Other Ambulatory Visit: Payer: Self-pay

## 2020-08-23 ENCOUNTER — Encounter: Payer: Self-pay | Admitting: Physician Assistant

## 2020-08-23 ENCOUNTER — Ambulatory Visit: Payer: PPO | Admitting: Physician Assistant

## 2020-08-23 VITALS — BP 110/74 | HR 67 | Ht 74.0 in | Wt 258.4 lb

## 2020-08-23 DIAGNOSIS — I25119 Atherosclerotic heart disease of native coronary artery with unspecified angina pectoris: Secondary | ICD-10-CM

## 2020-08-23 DIAGNOSIS — I2510 Atherosclerotic heart disease of native coronary artery without angina pectoris: Secondary | ICD-10-CM

## 2020-08-23 DIAGNOSIS — E782 Mixed hyperlipidemia: Secondary | ICD-10-CM

## 2020-08-23 DIAGNOSIS — I5032 Chronic diastolic (congestive) heart failure: Secondary | ICD-10-CM

## 2020-08-23 DIAGNOSIS — I10 Essential (primary) hypertension: Secondary | ICD-10-CM

## 2020-08-23 DIAGNOSIS — R0602 Shortness of breath: Secondary | ICD-10-CM

## 2020-08-23 DIAGNOSIS — I471 Supraventricular tachycardia: Secondary | ICD-10-CM

## 2020-08-23 DIAGNOSIS — I358 Other nonrheumatic aortic valve disorders: Secondary | ICD-10-CM | POA: Diagnosis not present

## 2020-08-23 NOTE — Patient Instructions (Signed)
Medication Instructions:  Your physician recommends that you continue on your current medications as directed. Please refer to the Current Medication list given to you today.  *If you need a refill on your cardiac medications before your next appointment, please call your pharmacy*   Lab Work: Your physician recommends that you have lab work today: Pro BNP/Bmet/Cbc  If you have labs (blood work) drawn today and your tests are completely normal, you will receive your results only by: [name] Kitchen MyChart Message (if you have MyChart) OR . A paper copy in the mail If you have any lab test that is abnormal or we need to change your treatment, we will call you to review the results.   Testing/Procedures: Your provider has recommended a cardiac catherization.   You will need a COVID test prior to your procedure - Go to Pre-Procedural COVID -19 testing at [address] in [place], [place] [address] on ______Friday, April 8_______________________________.   You are scheduled for a cardiac catheterization on Tuesday, April 12 with Dr. Neita Garnet or associate.  Please arrive at the [facility] (Main Entrance) at [facility] at [place] [facility] on Tuesday, April 12 at 5:30 am.  Free valet parking is available.   You are allowed only one visitor in the hospital with you. Both you and your visitor must wear masks.    Special note: Every effort is made to have your procedure done on time.   Please understand that emergencies sometimes delay a scheduled   procedure.  No solid foods after midnight on Monday, April 11. You may have clear liquids until 5 am on the day of your procedure.  On the morning of your procedure, take your medication but hold Lasix.   You may take your morning medications with a sip of water on the day of your procedure.  Please take a baby aspirin (81 mg) on the morning of your procedure.   Medications to HOLD - Metformin 24 hours before and  48 hours after.   Plan for a one night stay -- bring personal belongings.  Bring a current list of your medications and current insurance cards.  You MUST have a responsible person to drive you home. Someone MUST be with you the first 24 hours after you arrive home or your discharge will be delayed. Wear clothes that are easy to get on and off and wear slip on shoes.    Coronary Angiogram A coronary angiogram, also called coronary angiography, is an X-ray procedure used to look at the arteries in the heart. In this procedure, a dye (contrast dye) is injected through a long, hollow tube (catheter). The catheter is about the size of a piece of cooked spaghetti and is inserted through your groin, wrist, or arm. The dye is injected into each artery, and X-rays are then taken to show if there is a blockage in the arteries of your heart.  LET [facility] CARE PROVIDER KNOW ABOUT:  Any allergies you have, including allergies to shellfish or contrast dye.    All medicines you are taking, including vitamins, herbs, eye drops, creams, and over-the-counter medicines.    Previous problems you or members of your family have had with the use of anesthetics.    Any blood disorders you have.    Previous surgeries you have had.  History of kidney problems or failure.    Other medical conditions you have.  RISKS AND COMPLICATIONS  Generally, a  coronary angiogram is a safe procedure. However, about 1 person out of 1000 can have problems that may include:  Allergic reaction to the dye.  Bleeding/bruising from the access site or other locations.  Kidney injury, especially in people with impaired kidney function.   Stroke (rare).  Heart attack (rare).  Irregular rhythms (rare)  Death (rare)  BEFORE THE PROCEDURE   Do not eat or drink anything after midnight the night before the procedure or as directed by your health care provider.    Ask your health care provider about changing or stopping  your regular medicines. This is especially important if you are taking diabetes medicines or blood thinners.  PROCEDURE  You may be given a medicine to help you relax (sedative) before the procedure. This medicine is given through an intravenous (IV) access tube that is inserted into one of your veins.    The area where the catheter will be inserted will be washed and shaved. This is usually done in the groin but may be done in the fold of your arm (near your elbow) or in the wrist.     A medicine will be given to numb the area where the catheter will be inserted (local anesthetic).    The health care provider will insert the catheter into an artery. The catheter will be guided by using a special type of X-ray (fluoroscopy) of the blood vessel being examined.    A special dye will then be injected into the catheter, and X-rays will be taken. The dye will help to show where any narrowing or blockages are located in the heart arteries.     AFTER THE PROCEDURE   If the procedure is done through the leg, you will be kept in bed lying flat for several hours. You will be instructed to not bend or cross your legs.  The insertion site will be checked frequently.    The pulse in your feet or wrist will be checked frequently.    Additional blood tests, X-rays, and an electrocardiogram may be done.       Follow-Up: At [facility], you and your health needs are our priority.  As part of our continuing mission to provide you with exceptional heart care, we have created designated Provider Care Teams.  These Care Teams include your primary Cardiologist (physician) and Advanced Practice Providers (APPs -  Physician Assistants and Nurse Practitioners) who all work together to provide you with the care you need, when you need it.  We recommend signing up for the patient portal called "MyChart".  Sign up information is provided on this After Visit Summary.  MyChart is used to connect with patients  for Virtual Visits (Telemedicine).  Patients are able to view lab/test results, encounter notes, upcoming appointments, etc.  Non-urgent messages can be sent to your provider as well.   To learn more about what you can do with MyChart, go to [email or web address].    Your next appointment:   2 week(s)  The format for your next appointment:   In Person  Provider:   Richardson Dopp, PA-C   Other Instructions

## 2020-08-24 ENCOUNTER — Encounter: Payer: Self-pay | Admitting: *Deleted

## 2020-08-24 ENCOUNTER — Telehealth: Payer: Self-pay

## 2020-08-24 LAB — CBC
Hematocrit: 44.5 % (ref 37.5–51.0)
Hemoglobin: 14.5 g/dL (ref 13.0–17.7)
MCH: 28.4 pg (ref 26.6–33.0)
MCHC: 32.6 g/dL (ref 31.5–35.7)
MCV: 87 fL (ref 79–97)
Platelets: 295 10*3/uL (ref 150–450)
RBC: 5.1 x10E6/uL (ref 4.14–5.80)
RDW: 13.1 % (ref 11.6–15.4)
WBC: 8.7 10*3/uL (ref 3.4–10.8)

## 2020-08-24 LAB — BASIC METABOLIC PANEL
BUN/Creatinine Ratio: 14 (ref 10–24)
BUN: 16 mg/dL (ref 8–27)
CO2: 24 mmol/L (ref 20–29)
Calcium: 9.4 mg/dL (ref 8.6–10.2)
Chloride: 102 mmol/L (ref 96–106)
Creatinine, Ser: 1.15 mg/dL (ref 0.76–1.27)
Glucose: 158 mg/dL — ABNORMAL HIGH (ref 65–99)
Potassium: 4.9 mmol/L (ref 3.5–5.2)
Sodium: 140 mmol/L (ref 134–144)
eGFR: 66 mL/min/{1.73_m2} (ref >59)

## 2020-08-24 LAB — PRO B NATRIURETIC PEPTIDE: NT-Pro BNP: 137 pg/mL (ref 0–486)

## 2020-08-24 NOTE — Telephone Encounter (Signed)
S/w pt is aware of new date and time for Heart Cath and covid testing.  New cath letter will be sent out today.

## 2020-08-24 NOTE — Telephone Encounter (Signed)
Message received from covering [place] that patient wishes to reschedule his cath to 4/19 or 4/26. I have rescheduled his cath for 4/19 at 7:30 AM with Dr. Tamala Julian. Covid test has been rescheduled for 4/15 at 2:15 PM.  Attempted to call patient on his cell phone to make him aware but there was no answer and VM was full. Left message on home phone to call back.      [facility] OFFICE [place], [place] [address] Dept: [phone] Loc: [place]  08/24/2020  You are scheduled for a Cardiac Catheterization on Tuesday, April 19 with Dr. Daneen Schick.  1. Please arrive at the [facility] (Main Entrance A) at [facility]: [address] [place], [place] [address] at 5:30 AM (This time is two hours before your procedure to ensure your preparation). Free valet parking service is available.   Special note: Every effort is made to have your procedure done on time. Please understand that emergencies sometimes delay scheduled procedures.  2. Diet: Do not eat solid foods after midnight.  The patient may have clear liquids until 5am upon the day of the procedure.  3. Labs: Labs and EKG done on 08/23/20  Due to recent COVID-19 restrictions implemented by our local and state authorities and in an effort to keep both patients and staff as safe as possible, our hospital system requires COVID-19 testing prior to certain scheduled hospital procedures.  Please go to [place]. [place], [place] [address] on 09/02/20 at 2:15 PM.  This is a drive up testing site.  You will not need to exit your vehicle.  You will not be billed at the time of testing but may receive a bill later depending on your insurance. You must agree to self-quarantine from the time of your testing until the procedure date on 09/06/20.  This should included staying home with ONLY the people you live with.  Avoid take-out,  grocery store shopping or leaving the house for any non-emergent reason.  Failure to have your COVID-19 test done on the date and time you have been scheduled will result in cancellation of your procedure.  Please call our office at [phone] if you have any questions.   4. Medication instructions in preparation for your procedure:   Contrast Allergy: No  DO NOT take furosemide (lasix) and potassium the morning of the procedure.  DO NOT take metformin the morning of the procedure and for 48 hours after procedure.  On the morning of your procedure, take your Aspirin and Plavix/Clopidogrel and any morning medicines NOT listed above.  You may use sips of water.  5. Plan for one night stay--bring personal belongings. 6. Bring a current list of your medications and current insurance cards. 7. You MUST have a responsible person to drive you home. 8. Someone MUST be with you the first 24 hours after you arrive home or your discharge will be delayed. 9. Please wear clothes that are easy to get on and off and wear slip-on shoes.  Thank you for allowing [place] to care for you!   -- [facility]

## 2020-08-26 ENCOUNTER — Other Ambulatory Visit (HOSPITAL_COMMUNITY): Payer: PPO

## 2020-09-02 ENCOUNTER — Other Ambulatory Visit (HOSPITAL_COMMUNITY)
Admission: RE | Admit: 2020-09-02 | Discharge: 2020-09-02 | Disposition: A | Discharge status: Home / Self Care | Payer: PPO | Source: Ambulatory Visit | Attending: Interventional Cardiology | Admitting: Interventional Cardiology

## 2020-09-02 DIAGNOSIS — Z01812 Encounter for preprocedural laboratory examination: Secondary | ICD-10-CM | POA: Insufficient documentation

## 2020-09-02 DIAGNOSIS — Z20822 Contact with and (suspected) exposure to covid-19: Secondary | ICD-10-CM | POA: Insufficient documentation

## 2020-09-02 LAB — SARS CORONAVIRUS 2 (TAT 6-24 HRS): SARS Coronavirus 2: NEGATIVE

## 2020-09-05 NOTE — Pre-Procedure Instructions (Signed)
Attempted to call patient regarding procedure instructions for tomorrow. Unable to leave voicemail.  Mailbox is full

## 2020-09-05 NOTE — H&P (Signed)
Being cath for ? Anginal equivalent prior to holding DAPT for colonoscopy.  Had prior PDA (2019) and OM (2021) stents.

## 2020-09-06 ENCOUNTER — Encounter (HOSPITAL_COMMUNITY): Payer: Self-pay | Admitting: Interventional Cardiology

## 2020-09-06 ENCOUNTER — Other Ambulatory Visit: Payer: Self-pay

## 2020-09-06 ENCOUNTER — Ambulatory Visit (HOSPITAL_COMMUNITY)
Admission: RE | Admit: 2020-09-06 | Discharge: 2020-09-06 | Disposition: A | Discharge status: Home / Self Care | Payer: PPO | Attending: Interventional Cardiology | Admitting: Interventional Cardiology

## 2020-09-06 ENCOUNTER — Encounter (HOSPITAL_COMMUNITY)
Admission: RE | Disposition: A | Discharge status: Home / Self Care | Payer: Self-pay | Source: Home / Self Care | Attending: Interventional Cardiology

## 2020-09-06 DIAGNOSIS — I110 Hypertensive heart disease with heart failure: Secondary | ICD-10-CM | POA: Diagnosis not present

## 2020-09-06 DIAGNOSIS — Z7982 Long term (current) use of aspirin: Secondary | ICD-10-CM | POA: Diagnosis not present

## 2020-09-06 DIAGNOSIS — Z79899 Other long term (current) drug therapy: Secondary | ICD-10-CM | POA: Insufficient documentation

## 2020-09-06 DIAGNOSIS — E785 Hyperlipidemia, unspecified: Secondary | ICD-10-CM | POA: Diagnosis present

## 2020-09-06 DIAGNOSIS — Z79890 Hormone replacement therapy (postmenopausal): Secondary | ICD-10-CM | POA: Insufficient documentation

## 2020-09-06 DIAGNOSIS — I10 Essential (primary) hypertension: Secondary | ICD-10-CM | POA: Diagnosis present

## 2020-09-06 DIAGNOSIS — I200 Unstable angina: Secondary | ICD-10-CM | POA: Diagnosis present

## 2020-09-06 DIAGNOSIS — I358 Other nonrheumatic aortic valve disorders: Secondary | ICD-10-CM | POA: Insufficient documentation

## 2020-09-06 DIAGNOSIS — R0602 Shortness of breath: Secondary | ICD-10-CM | POA: Diagnosis not present

## 2020-09-06 DIAGNOSIS — I25119 Atherosclerotic heart disease of native coronary artery with unspecified angina pectoris: Secondary | ICD-10-CM

## 2020-09-06 DIAGNOSIS — E782 Mixed hyperlipidemia: Secondary | ICD-10-CM | POA: Diagnosis not present

## 2020-09-06 DIAGNOSIS — Z7902 Long term (current) use of antithrombotics/antiplatelets: Secondary | ICD-10-CM | POA: Diagnosis not present

## 2020-09-06 DIAGNOSIS — E118 Type 2 diabetes mellitus with unspecified complications: Secondary | ICD-10-CM | POA: Diagnosis present

## 2020-09-06 DIAGNOSIS — I2510 Atherosclerotic heart disease of native coronary artery without angina pectoris: Secondary | ICD-10-CM | POA: Diagnosis present

## 2020-09-06 DIAGNOSIS — I509 Heart failure, unspecified: Secondary | ICD-10-CM | POA: Diagnosis not present

## 2020-09-06 HISTORY — PX: RIGHT/LEFT HEART CATH AND CORONARY ANGIOGRAPHY: CATH118266

## 2020-09-06 LAB — POCT I-STAT EG7
Acid-Base Excess: 0 mmol/L (ref 0.0–2.0)
Acid-base deficit: 1 mmol/L (ref 0.0–2.0)
Bicarbonate: 25 mmol/L (ref 20.0–28.0)
Bicarbonate: 25.2 mmol/L (ref 20.0–28.0)
Calcium, Ion: 1.27 mmol/L (ref 1.15–1.40)
Calcium, Ion: 1.27 mmol/L (ref 1.15–1.40)
HCT: 38 % — ABNORMAL LOW (ref 39.0–52.0)
HCT: 39 % (ref 39.0–52.0)
Hemoglobin: 12.9 g/dL — ABNORMAL LOW (ref 13.0–17.0)
Hemoglobin: 13.3 g/dL (ref 13.0–17.0)
O2 Saturation: 70 %
O2 Saturation: 71 %
Potassium: 4 mmol/L (ref 3.5–5.1)
Potassium: 4 mmol/L (ref 3.5–5.1)
Sodium: 140 mmol/L (ref 135–145)
Sodium: 140 mmol/L (ref 135–145)
TCO2: 26 mmol/L (ref 22–32)
TCO2: 27 mmol/L (ref 22–32)
pCO2, Ven: 44.3 mmHg (ref 44.0–60.0)
pCO2, Ven: 44.7 mmHg (ref 44.0–60.0)
pH, Ven: 7.36 (ref 7.250–7.430)
pH, Ven: 7.36 (ref 7.250–7.430)
pO2, Ven: 39 mmHg (ref 32.0–45.0)
pO2, Ven: 39 mmHg (ref 32.0–45.0)

## 2020-09-06 LAB — POCT I-STAT 7, (LYTES, BLD GAS, ICA,H+H)
Acid-base deficit: 3 mmol/L — ABNORMAL HIGH (ref 0.0–2.0)
Bicarbonate: 22.1 mmol/L (ref 20.0–28.0)
Calcium, Ion: 1.25 mmol/L (ref 1.15–1.40)
HCT: 37 % — ABNORMAL LOW (ref 39.0–52.0)
Hemoglobin: 12.6 g/dL — ABNORMAL LOW (ref 13.0–17.0)
O2 Saturation: 95 %
Potassium: 3.7 mmol/L (ref 3.5–5.1)
Sodium: 135 mmol/L (ref 135–145)
TCO2: 23 mmol/L (ref 22–32)
pCO2 arterial: 40 mmHg (ref 32.0–48.0)
pH, Arterial: 7.35 (ref 7.350–7.450)
pO2, Arterial: 82 mmHg — ABNORMAL LOW (ref 83.0–108.0)

## 2020-09-06 LAB — GLUCOSE, CAPILLARY: Glucose-Capillary: 133 mg/dL — ABNORMAL HIGH (ref 70–99)

## 2020-09-06 SURGERY — RIGHT/LEFT HEART CATH AND CORONARY ANGIOGRAPHY
Anesthesia: LOCAL

## 2020-09-06 MED ORDER — SODIUM CHLORIDE 0.9 % WEIGHT BASED INFUSION
1.0000 mL/kg/h | INTRAVENOUS | Status: DC
Start: 2020-09-06 — End: 2020-09-06

## 2020-09-06 MED ORDER — ASPIRIN 81 MG PO CHEW: 81.0000 mg | CHEWABLE_TABLET | ORAL | Status: DC

## 2020-09-06 MED ORDER — SODIUM CHLORIDE 0.9 % IV SOLN: INTRAVENOUS | Status: DC

## 2020-09-06 MED ORDER — HEPARIN (PORCINE) IN NACL 1000-0.9 UT/500ML-% IV SOLN
INTRAVENOUS | Status: AC
  Filled 2020-09-06: qty 1000

## 2020-09-06 MED ORDER — LIDOCAINE HCL (PF) 1 % IJ SOLN
INTRAMUSCULAR | Status: AC
  Filled 2020-09-06: qty 30

## 2020-09-06 MED ORDER — IOHEXOL 350 MG/ML SOLN
INTRAVENOUS | Status: DC | PRN
  Administered 2020-09-06: 90 mL

## 2020-09-06 MED ORDER — MIDAZOLAM HCL 2 MG/2ML IJ SOLN
INTRAMUSCULAR | Status: DC | PRN
  Administered 2020-09-06: 1 mg via INTRAVENOUS

## 2020-09-06 MED ORDER — ACETAMINOPHEN 325 MG PO TABS: 650.0000 mg | ORAL_TABLET | ORAL | Status: DC | PRN

## 2020-09-06 MED ORDER — ONDANSETRON HCL 4 MG/2ML IJ SOLN: 4.0000 mg | Freq: Four times a day (QID) | INTRAMUSCULAR | Status: DC | PRN

## 2020-09-06 MED ORDER — LIDOCAINE HCL (PF) 1 % IJ SOLN
INTRAMUSCULAR | Status: DC | PRN
Start: 2020-09-06 — End: 2020-09-06
  Administered 2020-09-06: 5 mL via INTRADERMAL

## 2020-09-06 MED ORDER — HEPARIN (PORCINE) IN NACL 1000-0.9 UT/500ML-% IV SOLN
INTRAVENOUS | Status: DC | PRN
  Administered 2020-09-06 (×2): 500 mL

## 2020-09-06 MED ORDER — SODIUM CHLORIDE 0.9% FLUSH: 3.0000 mL | INTRAVENOUS | Status: DC | PRN

## 2020-09-06 MED ORDER — HYDRALAZINE HCL 20 MG/ML IJ SOLN: 10.0000 mg | INTRAMUSCULAR | Status: DC | PRN

## 2020-09-06 MED ORDER — VERAPAMIL HCL 2.5 MG/ML IV SOLN
INTRAVENOUS | Status: AC
  Filled 2020-09-06: qty 2

## 2020-09-06 MED ORDER — HEPARIN SODIUM (PORCINE) 1000 UNIT/ML IJ SOLN
INTRAMUSCULAR | Status: DC | PRN
  Administered 2020-09-06: 5500 [IU] via INTRAVENOUS

## 2020-09-06 MED ORDER — LABETALOL HCL 5 MG/ML IV SOLN: 10.0000 mg | INTRAVENOUS | Status: DC | PRN

## 2020-09-06 MED ORDER — VERAPAMIL HCL 2.5 MG/ML IV SOLN
INTRAVENOUS | Status: DC | PRN
  Administered 2020-09-06: 10 mL via INTRA_ARTERIAL

## 2020-09-06 MED ORDER — FENTANYL CITRATE (PF) 100 MCG/2ML IJ SOLN
INTRAMUSCULAR | Status: AC
  Filled 2020-09-06: qty 2

## 2020-09-06 MED ORDER — OXYCODONE HCL 5 MG PO TABS: 5.0000 mg | ORAL_TABLET | ORAL | Status: DC | PRN

## 2020-09-06 MED ORDER — FENTANYL CITRATE (PF) 100 MCG/2ML IJ SOLN
INTRAMUSCULAR | Status: DC | PRN
  Administered 2020-09-06: 50 ug via INTRAVENOUS

## 2020-09-06 MED ORDER — SODIUM CHLORIDE 0.9 % IV SOLN: 250.0000 mL | INTRAVENOUS | Status: DC | PRN

## 2020-09-06 MED ORDER — SODIUM CHLORIDE 0.9 % WEIGHT BASED INFUSION
3.0000 mL/kg/h | INTRAVENOUS | Status: DC
  Administered 2020-09-06: 3 mL/kg/h via INTRAVENOUS

## 2020-09-06 MED ORDER — MIDAZOLAM HCL 2 MG/2ML IJ SOLN
INTRAMUSCULAR | Status: AC
  Filled 2020-09-06: qty 2

## 2020-09-06 MED ORDER — SODIUM CHLORIDE 0.9% FLUSH: 3.0000 mL | Freq: Two times a day (BID) | INTRAVENOUS | Status: DC

## 2020-09-06 SURGICAL SUPPLY — 12 items
CATH 5FR JL3.5 JR4 ANG PIG MP (CATHETERS) ×2 IMPLANT
CATH SWAN GANZ 7F STRAIGHT (CATHETERS) ×2 IMPLANT
DEVICE RAD COMP TR BAND LRG (VASCULAR PRODUCTS) ×2 IMPLANT
GLIDESHEATH SLEND A-KIT 6F 22G (SHEATH) ×2 IMPLANT
GUIDEWIRE .025 260CM (WIRE) ×2 IMPLANT
GUIDEWIRE INQWIRE 1.5J.035X260 (WIRE) ×1 IMPLANT
INQWIRE 1.5J .035X260CM (WIRE) ×2
KIT HEART LEFT (KITS) ×2 IMPLANT
PACK CARDIAC CATHETERIZATION (CUSTOM PROCEDURE TRAY) ×2 IMPLANT
SHEATH PROBE COVER 6X72 (BAG) ×2 IMPLANT
TRANSDUCER W/STOPCOCK (MISCELLANEOUS) ×2 IMPLANT
TUBING CIL FLEX 10 FLL-RA (TUBING) ×2 IMPLANT

## 2020-09-06 NOTE — CV Procedure (Addendum)
   Anatomy is unchanged when compared to images following obtuse marginal stenting in September.  LAD is an intermediate stenosis in the 50 to 70% range.  In retrospect which I had performed hemodynamic assessment.  It is no different in appearance now than on prior imaging.  Right heart pressures are normal.  Okay to pause antiplatelet therapy prior to colonoscopy.  No obvious explanation for dyspnea on exertion.  Consider phase 2 cardiac rehab

## 2020-09-06 NOTE — Discharge Instructions (Signed)
Radial Site Care  This sheet gives you information about how to care for yourself after your procedure. Your health care provider may also give you more specific instructions. If you have problems or questions, contact your health care provider. What can I expect after the procedure? After the procedure, it is common to have:  Bruising and tenderness at the catheter insertion area. Follow these instructions at home: Medicines  Take over-the-counter and prescription medicines only as told by your health care provider. Insertion site care 1. Follow instructions from your health care provider about how to take care of your insertion site. Make sure you: ? Wash your hands with soap and water before you remove your bandage (dressing). If soap and water are not available, use hand sanitizer. ? May remove dressing in 24 hours. 2. Check your insertion site every day for signs of infection. Check for: ? Redness, swelling, or pain. ? Fluid or blood. ? Pus or a bad smell. ? Warmth. 3. Do no take baths, swim, or use a hot tub for 5 days. 4. You may shower 24-48 hours after the procedure. ? Remove the dressing and gently wash the site with plain soap and water. ? Pat the area dry with a clean towel. ? Do not rub the site. That could cause bleeding. 5. Do not apply powder or lotion to the site. Activity  1. For 24 hours after the procedure, or as directed by your health care provider: ? Do not flex or bend the affected arm. ? Do not push or pull heavy objects with the affected arm. ? Do not drive yourself home from the hospital or clinic. You may drive 24 hours after the procedure. ? Do not operate machinery or power tools. ? KEEP ARM ELEVATED THE REMAINDER OF THE DAY. 2. Do not push, pull or lift anything that is heavier than 10 lb for 5 days. 3. Ask your health care provider when it is okay to: ? Return to work or school. ? Resume usual physical activities or sports. ? Resume sexual  activity. General instructions  If the catheter site starts to bleed, raise your arm and put firm pressure on the site. If the bleeding does not stop, get help right away. This is a medical emergency.  DRINK PLENTY OF FLUIDS FOR THE NEXT 2-3 DAYS.  No alcohol consumption for 24 hours after receiving sedation.  If you went home on the same day as your procedure, a responsible adult should be with you for the first 24 hours after you arrive home.  Keep all follow-up visits as told by your health care provider. This is important. Contact a health care provider if:  You have a fever.  You have redness, swelling, or yellow drainage around your insertion site. Get help right away if:  You have unusual pain at the radial site.  The catheter insertion area swells very fast.  The insertion area is bleeding, and the bleeding does not stop when you hold steady pressure on the area.  Your arm or hand becomes pale, cool, tingly, or numb. These symptoms may represent a serious problem that is an emergency. Do not wait to see if the symptoms will go away. Get medical help right away. Call your local emergency services (911 in the [place] Do not drive yourself to the hospital. Summary  After the procedure, it is common to have bruising and tenderness at the site.  Follow instructions from your health care provider about how to take care   of your radial site wound. Check the wound every day for signs of infection.  This information is not intended to replace advice given to you by your health care provider. Make sure you discuss any questions you have with your health care provider. Document Revised: 06/12/2017 Document Reviewed: 06/12/2017 Elsevier Patient Education  2020 [employer] 

## 2020-09-06 NOTE — Interval H&P Note (Signed)
Cath Lab Visit (complete for each Cath Lab visit)  Clinical Evaluation Leading to the Procedure:   ACS: No.  Non-ACS:    Anginal Classification: CCS III  Anti-ischemic medical therapy: Minimal Therapy (1 class of medications)  Non-Invasive Test Results: No non-invasive testing performed  Prior CABG: No previous CABG      History and Physical Interval Note:  09/06/2020 7:31 AM  Peter Brooks  has presented today for surgery, with the diagnosis of cad - sob - angina.  The various methods of treatment have been discussed with the patient and family. After consideration of risks, benefits and other options for treatment, the patient has consented to  Procedure(s): RIGHT/LEFT HEART CATH AND CORONARY ANGIOGRAPHY (N/A) as a surgical intervention.  The patient's history has been reviewed, patient examined, no change in status, stable for surgery.  I have reviewed the patient's chart and labs.  Questions were answered to the patient's satisfaction.     Belva Crome III

## 2020-09-06 NOTE — Progress Notes (Signed)
Discharge instructions reviewed with pt and his wife. Both voice understanding. 

## 2020-09-06 NOTE — Progress Notes (Signed)
TRb removed and dressing applied

## 2020-09-12 ENCOUNTER — Encounter: Payer: PPO | Admitting: Gastroenterology

## 2020-09-18 NOTE — Progress Notes (Addendum)
Cardiology Office Note:    Date:  09/20/2020   ID:  Peter Brooks, DOB [birth year], MRN [id number]  PCP:  Street, Sharon Mt, MD   [facility] Providers Cardiologist:  Lauree Chandler, MD     Referring MD: Street, Sharon Mt, *   Chief Complaint:  No chief complaint on file.    Patient Profile:    Peter Brooks is a 75 y.o. male with:   Coronary artery disease [anginal equiv - CP/SOB] ? S/p DES to R PDA in 10/19 ? Myoview 8/21: no ischemia; low risk ? S/p DES to OM1 in 9/21 ? Cath 4/22: PDA and OM1 stents patent; mLAD 50-70, Dx 50 - Med Rx  (HFpEF) heart failure with preserved ejection fraction  ? Echo 5/21: EF 60-65 mild LVH, Gr 1 DD, RVSP 31.1, AV sclerosis without stenosis  Hypertension   Hyperlipidemia   SVT  GERD  Diabetes mellitus   Hypothyroidism  Prior CV studies: RIGHT/LEFT HEART CATH AND CORONARY ANGIOGRAPHY 09/06/2020 Narrative  Right dominant with diffuse mild luminal irregularities.  Patent stent proximal PDA.  PDA is small.  Widely patent left main.  LAD reaches the left ventricular apex.  The mid vessel contains a 50 to 70% stenosis and forms a [place] 011 bifurcation stenosis with the diagonal.  Diagonal contains 50% stenosis.  The LAD is unchanged compared to prior imaging in 2021.  Circumflex has moderate diffuse disease.  The stent in the small first obtuse marginal is widely patent.  Normal LV function.  LVEDP 12 mmHg.  EF 55%.  Normal right heart pressures. RECOMMENDATIONS:  No significant change in anatomy compared to September 2021.  Patent obtuse marginal and PDA stents.  Moderate mid LAD bifurcation stenosis, unchanged compared to prior.  Okay to pause antiplatelet therapy for colonoscopy  Consider phase 2 cardiac rehab to further assess the patient's exertional fatigue.  Consider sleep study.  Consider arrhythmia.   RIGHT/LEFT HEART CATH 02/18/2020 RCA proximal-mid 20; RPDA stent patent LCx  ostial-proximal 30; OM1 90, OM2 30 LAD proximal to mid 50, distal 20 PCI:  SYNERGY XD 2.25X12 to the OM1   GATED SPECT MYO PERF W/EXERCISE STRESS 1D 12/29/2019 Narrative  Nuclear stress EF: 65%.  Blood pressure demonstrated a normal response to exercise.  There was no ST segment deviation noted during stress.  Defect 1: There is a medium defect of mild severity present in the basal inferior and mid inferior location.  The study is normal.  This is a low risk study.  The left ventricular ejection fraction is normal (55-65%). Normal stress nuclear study with inferior thinning but no ischemia.  Gated ejection fraction 65% with normal wall motion.   ECHOCARDIOGRAM 10/12/19 EF 60-65, no RWMA, mild LVH, GR 1 DD, normal RVSF, RVSP 31.1, AV sclerosis without stenosis  LONG TERM MONITOR (3-7 DAYS) INTERPRETATION 02/23/2019 Narrative Sinus rhythm 20 episodes of supraventricular tachycardia. The longest episode lasted 1 minute, 11 seconds. Most of the episodes were only for a few seconds. Rare premature ventricular contractions   History of Present Illness: Mr. Domzalski was last seen 08/23/20.  He had symptoms of shortness of breath with exertion that was similar to his prior anginal equivalent.  He was set up for cardiac catheterization that demonstrated a patent stent in the proximal PDA and patent stent in OM1.  There was moderate stenosis in the mid LAD and Dx which was unchanged compared with the cath in 01/2020.  Med Rx was recommended.  He returns for f/u.  He is here alone.  He continues to have the same symptoms.  He has lightheadedness and weakness with activity as well as dyspnea.  He can sometimes go for long walks without symptoms.  He also has some chest heaviness after activity.  His wife is a retired Marine scientist and she has noted that his pulse is rapid and irregular at times.  He has not had any witnessed apnea or reported loud snoring.  He has not had syncope.  He has not had  orthopnea, significant leg edema.        Past Medical History:  Diagnosis Date  . Anxiety   . Arthritis   . CAD (coronary artery disease)    LHC 02/18/2020: prior stent to RPDA patent, 90% stenosis of OM1 s/p PTCA/DES, 50% 1st Diag, 50% proximal to mid LAD, 20% distal LAD, 30% ostial to proximal CX, 30% OM2.  . Cataract    removed bilaterally   . Chest pain   . Diabetes mellitus without complication ([place])   . Dizziness   . GERD (gastroesophageal reflux disease)   . Hyperlipidemia   . Hypertension   . Hypothyroidism   . Lumbar herniated disc     Current Medications: Current Meds  Medication Sig  . amLODipine (NORVASC) 5 MG tablet Take 0.5 tablets (2.5 mg total) by mouth daily.  . Ascorbic Acid (VITAMIN C) 1000 MG tablet Take 1,000 mg by mouth daily.  [name] Kitchen aspirin EC 81 MG tablet Take 81 mg by mouth every evening.   . Blood Glucose Monitoring Suppl (ONE TOUCH ULTRA 2) w/Device KIT See admin instructions.  . Calcium Carbonate-Vitamin D 600-400 MG-UNIT tablet Take 1 tablet by mouth daily.  . clopidogrel (PLAVIX) 75 MG tablet Take 1 tablet (75 mg total) by mouth daily.  . Coenzyme Q10 (COQ10) 200 MG CAPS Take 200 mg by mouth daily.  . diazepam (VALIUM) 2 MG tablet Take 1 tablet (2 mg total) by mouth every 12 (twelve) hours as needed for anxiety.  . docusate sodium (COLACE) 100 MG capsule Take 100 mg by mouth daily as needed for mild constipation.  . furosemide (LASIX) 40 MG tablet Take 1 tablet (40 mg total) by mouth 2 (two) times daily.  [name] Kitchen levothyroxine (SYNTHROID, LEVOTHROID) 50 MCG tablet Take 50 mcg by mouth daily.  . metFORMIN (GLUCOPHAGE) 500 MG tablet Take 500 mg by mouth 2 (two) times daily.  . metoprolol succinate (TOPROL XL) 100 MG 24 hr tablet Take 1 tablet (100 mg total) by mouth daily.  . Multiple Vitamin (MULITIVITAMIN WITH MINERALS) TABS Take 1 tablet by mouth daily.  . Multiple Vitamins-Minerals (PRESERVISION AREDS 2) CAPS Take 1 capsule by mouth 2 (two) times daily.   .  nitroGLYCERIN (NITROSTAT) 0.4 MG SL tablet Place 1 tablet (0.4 mg total) under the tongue every 5 (five) minutes as needed.  Glory Rosebush ULTRA test strip USE 1 STRIP TO CHECK GLUCOSE ONCE DAILY  . potassium chloride SA (KLOR-CON) 20 MEQ tablet Take 1 tablet (20 mEq total) by mouth 2 (two) times daily.  . Probiotic Product (PROBIOTIC DAILY PO) Take 1 tablet by mouth daily.  . rosuvastatin (CRESTOR) 10 MG tablet Take 10 mg by mouth every evening.  . [DISCONTINUED] amLODipine (NORVASC) 5 MG tablet Take 1 tablet by mouth once daily     Allergies:   Cefdinir   Social History   Tobacco Use  . Smoking status: Never Smoker  . Smokeless tobacco: Never Used  Vaping Use  . Vaping Use: Never used  Substance Use Topics  . Alcohol use: No  . Drug use: No     Family Hx: The patient's family history includes Colon cancer (age of onset: 45) in his paternal uncle; Coronary artery disease in his father; Heart attack in his mother; Stomach cancer in his paternal grandmother. There is no history of Esophageal cancer, Rectal cancer, or Colon polyps.  ROS   EKGs/Labs/Other Test Reviewed:    EKG:  EKG is   ordered today.  The ekg ordered today demonstrates NSR, HR 68, left axis deviation, incomplete right bundle branch block, QTC 455, no change from prior tracing  Recent Labs: 06/01/2020: ALT 15 08/23/2020: BUN 16; Creatinine, Ser 1.15; NT-Pro BNP 137; Platelets 295 09/06/2020: Hemoglobin 12.6; Potassium 3.7; Sodium 135   Recent Lipid Panel Lab Results  Component Value Date/Time   CHOL 114 06/01/2020 08:48 AM   TRIG 128 06/01/2020 08:48 AM   HDL 39 (L) 06/01/2020 08:48 AM   CHOLHDL 2.9 06/01/2020 08:48 AM   CHOLHDL 3.6 08/09/2011 07:10 AM   LDLCALC 52 06/01/2020 08:48 AM      Risk Assessment/Calculations:      Physical Exam:    VS:  BP 98/60   Pulse 68   Ht $R'6\' 2"'aR$  (1.88 m)   Wt 264 lb 3.2 oz (119.8 kg)   SpO2 97%   BMI 33.92 kg/m     Wt Readings from Last 3 Encounters:  09/20/20 264  lb 3.2 oz (119.8 kg)  09/06/20 258 lb (117 kg)  08/23/20 258 lb 6.4 oz (117.2 kg)     Constitutional:      Appearance: Healthy appearance. Not in distress.  Neck:     Vascular: JVD normal.  Pulmonary:     Breath sounds: No wheezing. No rales.  Cardiovascular:     Normal rate. Regular rhythm. Normal S1. Normal S2.     Murmurs: There is a grade 1/6 systolic murmur at the URSB.     Comments: R wrist without hematoma Pulses:    Intact distal pulses.  Edema:    Peripheral edema absent.  Abdominal:     Palpations: Abdomen is soft.  Skin:    General: Skin is warm and dry.  Neurological:     General: No focal deficit present.     Mental Status: Alert and oriented to person, place and time.     Cranial Nerves: Cranial nerves are intact.         ASSESSMENT & PLAN:    1. Shortness of breath  2. Palpitations He continues to have symptoms of shortness of breath.  He also notes symptoms of palpitations.  His wife has palpated rapid and irregular pulse when he is having symptoms. He has some chest heaviness at times after activity.  His symptoms have not changed any since prior to his cath.  His BP is low today and he reports lightheadedness and weakness.  He has lost some weight recently.  His R heart pressures on RHC were normal.  His O2 sats are normal.  Recent NT-Pro BNP was normal.  His Hgb was mildly low but overall stable and would not explain his current symptoms.  His lung exam is normal.  I suspect his low BP may be contributing to his symptoms.  I cannot rule out an arrhythmia given his rapid pulse at times.  He does have a hx of SVT.  He is not certain that he could make it to cardiac rehabilitation given his work schedule.  He does not  have a hx of loud snoring or witnessed apnea.  I have asked him to have his wife note if these occur.  If so, we can pursue sleep testing.    -Decrease amlodipine to 2.5 mg once daily  -DC amlodipine if BP does not improve/symptoms  persist.  -Continue current dose of furosemide, metoprolol succinate  -Obtain Zio AT x 14 days/30 day event monitor  -F/u in 3 mos  3. Coronary artery disease involving native coronary artery of native heart with angina pectoris [facility]) History of prior stenting to the RPDA in 2019 and DES to the OM1 in 9/21.  Recent cardiac catheterization demonstrated stable anatomy with cath from 01/2020.  PDA and OM1 stents are patent and there is mod non-obs disease in the LAD. Med Rx has been continued.  He may hold ASA and clopidogrel for his upcoming colonoscopy.  Continue DAPT, rosuvastatin, beta-blocker.   4. Chronic heart failure with preserved ejection fraction (HCC) Recent NT-Pro BNP normal.  Volume status appears stable.  NYHA IIb-III.  Question if symptoms related to low BP.  Continue current dose of furosemide.  Med changes as outlined above.   5. Essential hypertension, benign BP running low.  Adjust meds as noted.    Dispo:  Return in about 3 months (around 12/21/2020) for Routine follow up in 3 months with Dr. Angelena Form. .   Medication Adjustments/Labs and Tests Ordered: Current medicines are reviewed at length with the patient today.  Concerns regarding medicines are outlined above.  Tests Ordered: Orders Placed This Encounter  Procedures  . LONG TERM MONITOR (3-14 DAYS)  . EKG 12-Lead   Medication Changes: Meds ordered this encounter  Medications  . amLODipine (NORVASC) 5 MG tablet    Sig: Take 0.5 tablets (2.5 mg total) by mouth daily.    Dispense:  30 tablet    Refill:  3    Signed, Richardson Dopp, PA-C  09/20/2020 10:05 AM    [facility] [address] [place], [place]  [address] Phone: [phone]; Fax: [phone]

## 2020-09-20 ENCOUNTER — Ambulatory Visit (INDEPENDENT_AMBULATORY_CARE_PROVIDER_SITE_OTHER): Payer: PPO

## 2020-09-20 ENCOUNTER — Ambulatory Visit: Payer: PPO | Admitting: Physician Assistant

## 2020-09-20 ENCOUNTER — Encounter: Payer: Self-pay | Admitting: *Deleted

## 2020-09-20 ENCOUNTER — Encounter: Payer: Self-pay | Admitting: Physician Assistant

## 2020-09-20 ENCOUNTER — Other Ambulatory Visit: Payer: Self-pay

## 2020-09-20 VITALS — BP 98/60 | HR 68 | Ht 74.0 in | Wt 264.2 lb

## 2020-09-20 DIAGNOSIS — R0602 Shortness of breath: Secondary | ICD-10-CM | POA: Diagnosis not present

## 2020-09-20 DIAGNOSIS — I5032 Chronic diastolic (congestive) heart failure: Secondary | ICD-10-CM

## 2020-09-20 DIAGNOSIS — R002 Palpitations: Secondary | ICD-10-CM

## 2020-09-20 DIAGNOSIS — I25119 Atherosclerotic heart disease of native coronary artery with unspecified angina pectoris: Secondary | ICD-10-CM

## 2020-09-20 DIAGNOSIS — I10 Essential (primary) hypertension: Secondary | ICD-10-CM | POA: Diagnosis not present

## 2020-09-20 MED ORDER — AMLODIPINE BESYLATE 5 MG PO TABS: 2.5000 mg | ORAL_TABLET | Freq: Every day | ORAL | 3 refills | Status: DC

## 2020-09-20 NOTE — Patient Instructions (Signed)
Medication Instructions:   Your physician has recommended you make the following change in your medication:   1.  Decrease amlodipine one half tablet ( 2.5 mg) daily.  If symptoms do not completely resolves 1-2 weeks and systolic bp in less than <824 discontinue amlodipine.   *If you need a refill on your cardiac medications before your next appointment, please call your pharmacy*   Lab Work: -None  If you have labs (blood work) drawn today and your tests are completely normal, you will receive your results only by: [name] Kitchen MyChart Message (if you have MyChart) OR . A paper copy in the mail If you have any lab test that is abnormal or we need to change your treatment, we will call you to review the results.   Testing/Procedures: [place]n [place]- Long Term Monitor Instructions   Your physician has requested you wear a ZIO patch monitor for __14_ days.  This is a single patch monitor.   [employer] supplies one patch monitor per enrollment. Additional stickers are not available. Please do not apply patch if you will be having a Nuclear Stress Test, Echocardiogram, Cardiac CT, MRI, or Chest Xray during the period you would be wearing the monitor. The patch cannot be worn during these tests. You cannot remove and re-apply the ZIO XT patch monitor.  Your ZIO patch monitor will be sent Fed Ex from [employer] directly to your home address. It may take 3-5 days to receive your monitor after you have been enrolled.  Once you have received your monitor, please review the enclosed instructions. Your monitor has already been registered assigning a specific monitor serial # to you.  Billing and Patient Assistance Program Information   We have supplied [employer] with any of your insurance information on file for billing purposes. [employer] offers a sliding scale Patient Assistance Program for patients that do not have insurance, or whose insurance does not completely cover the cost of the ZIO monitor.   You must  apply for the Patient Assistance Program to qualify for this discounted rate.     To apply, please call [employer] at [phone], select option 4, then select option 2, and ask to apply for Patient Assistance Program.  Theodore Demark will ask your household income, and how many people are in your household.  They will quote your out-of-pocket cost based on that information.  [employer] will also be able to set up a 40-month, interest-free payment plan if needed.  Applying the monitor   Shave hair from upper left chest.  Hold abrader disc by orange tab. Rub abrader in 40 strokes over the upper left chest as indicated in your monitor instructions.  Clean area with 4 enclosed alcohol pads. Let dry.  Apply patch as indicated in monitor instructions. Patch will be placed under collarbone on left side of chest with arrow pointing upward.  Rub patch adhesive wings for 2 minutes. Remove white label marked "1". Remove the white label marked "2". Rub patch adhesive wings for 2 additional minutes.  While looking in a mirror, press and release button in center of patch. A small green light will flash 3-4 times. This will be your only indicator that the monitor has been turned on. ?  Do not shower for the first 24 hours. You may shower after the first 24 hours.  Press the button if you feel a symptom. You will hear a small click. Record Date, Time and Symptom in the Patient Logbook.  When you are ready to remove the  patch, follow instructions on the last 2 pages of the Patient Logbook. Stick patch monitor onto the last page of Patient Logbook.  Place Patient Logbook in the blue and white box.  Use locking tab on box and tape box closed securely.  The blue and white box has prepaid postage on it. Please place it in the mailbox as soon as possible. Your physician should have your test results approximately 7 days after the monitor has been mailed back to [facility].  Call [facility] at [phone] if you  have questions regarding your ZIO XT patch monitor. Call them immediately if you see an orange light blinking on your monitor.  If your monitor falls off in less than 4 days, contact our Monitor department at [phone]. ?If your monitor becomes loose or falls off after 4 days call [employer] at [phone] for suggestions on securing your monitor.?    Follow-Up: At [facility], you and your health needs are our priority.  As part of our continuing mission to provide you with exceptional heart care, we have created designated Provider Care Teams.  These Care Teams include your primary Cardiologist (physician) and Advanced Practice Providers (APPs -  Physician Assistants and Nurse Practitioners) who all work together to provide you with the care you need, when you need it.  We recommend signing up for the patient portal called "MyChart".  Sign up information is provided on this After Visit Summary.  MyChart is used to connect with patients for Virtual Visits (Telemedicine).  Patients are able to view lab/test results, encounter notes, upcoming appointments, etc.  Non-urgent messages can be sent to your provider as well.   To learn more about what you can do with MyChart, go to [email or web address].    Your next appointment:   3 month(s)  The format for your next appointment:   In Person  Provider:   Lauree Chandler, MD   Other Instructions Call if symptoms persist despite discontinuing amlodipine.  Keep eye on BP try to check once daily and keep diary.

## 2020-09-20 NOTE — Progress Notes (Unsigned)
Patient enrolled for [employer] to ship a 14 day ZIO XT monitor to his home. Letter with instructions mailed to patient.

## 2020-09-23 DIAGNOSIS — I5032 Chronic diastolic (congestive) heart failure: Secondary | ICD-10-CM | POA: Diagnosis not present

## 2020-09-23 DIAGNOSIS — R002 Palpitations: Secondary | ICD-10-CM

## 2020-09-23 DIAGNOSIS — I10 Essential (primary) hypertension: Secondary | ICD-10-CM | POA: Diagnosis not present

## 2020-09-23 DIAGNOSIS — I25119 Atherosclerotic heart disease of native coronary artery with unspecified angina pectoris: Secondary | ICD-10-CM

## 2020-10-13 DIAGNOSIS — R002 Palpitations: Secondary | ICD-10-CM | POA: Diagnosis not present

## 2020-10-13 DIAGNOSIS — I5032 Chronic diastolic (congestive) heart failure: Secondary | ICD-10-CM | POA: Diagnosis not present

## 2020-10-13 DIAGNOSIS — I10 Essential (primary) hypertension: Secondary | ICD-10-CM | POA: Diagnosis not present

## 2020-10-13 DIAGNOSIS — I25119 Atherosclerotic heart disease of native coronary artery with unspecified angina pectoris: Secondary | ICD-10-CM | POA: Diagnosis not present

## 2020-10-14 ENCOUNTER — Other Ambulatory Visit: Payer: Self-pay | Admitting: *Deleted

## 2020-10-14 MED ORDER — METOPROLOL SUCCINATE ER 100 MG PO TB24: 150.0000 mg | ORAL_TABLET | Freq: Every day | ORAL | 3 refills | Status: DC

## 2020-10-19 ENCOUNTER — Telehealth: Payer: Self-pay | Admitting: Physician Assistant

## 2020-10-19 NOTE — Telephone Encounter (Signed)
Spoke with patient and he reports his lightheadedness and rapid palpitations have gotten better since increasing metoprolol.  Patient states his b/p is elevated and advised patient to resume his amlodipine at 2.5 mg daily.  Advised patient to call back if issues do not resolve.  Patient verbalizes understanding and will call back if needed.

## 2020-10-19 NOTE — Telephone Encounter (Signed)
If he is still having episodes of lightheadedness and rapid palpitations >> Increase Metoprolol succinate to 100 mg twice daily   If the rapid heartbeats, shortness of breath and lightheadedness symptoms have improved >> resume the Amlodipine at 2.5 mg once daily  Richardson Dopp, PA-C    10/19/2020 5:17 PM

## 2020-10-19 NOTE — Telephone Encounter (Signed)
    Pt c/o BP issue: STAT if pt c/o blurred vision, one-sided weakness or slurred speech  1. What are your last 5 BP readings?  06/30 - 156/81 06/31 - 156/71 This morning - 160/91 After an hour and a half - 156/89  2. Are you having any other symptoms (ex. Dizziness, headache, blurred vision, passed out)? Slight headache  3. What is your BP issue? Pt would like to give his BP readings to [place]. He said after changing his meds, his BP still elevated.

## 2020-11-15 ENCOUNTER — Other Ambulatory Visit: Payer: Self-pay | Admitting: Student

## 2020-12-04 ENCOUNTER — Encounter (HOSPITAL_COMMUNITY): Payer: Self-pay | Admitting: Emergency Medicine

## 2020-12-04 ENCOUNTER — Observation Stay (HOSPITAL_COMMUNITY)
Admission: EM | Admit: 2020-12-04 | Discharge: 2020-12-05 | Disposition: A | Discharge status: Home / Self Care | Payer: PPO | Attending: Internal Medicine | Admitting: Internal Medicine

## 2020-12-04 ENCOUNTER — Emergency Department (HOSPITAL_COMMUNITY): Payer: PPO

## 2020-12-04 ENCOUNTER — Other Ambulatory Visit: Payer: Self-pay

## 2020-12-04 ENCOUNTER — Inpatient Hospital Stay (HOSPITAL_COMMUNITY): Payer: PPO

## 2020-12-04 DIAGNOSIS — E119 Type 2 diabetes mellitus without complications: Secondary | ICD-10-CM | POA: Insufficient documentation

## 2020-12-04 DIAGNOSIS — Z7982 Long term (current) use of aspirin: Secondary | ICD-10-CM | POA: Diagnosis not present

## 2020-12-04 DIAGNOSIS — R55 Syncope and collapse: Secondary | ICD-10-CM | POA: Diagnosis not present

## 2020-12-04 DIAGNOSIS — I2510 Atherosclerotic heart disease of native coronary artery without angina pectoris: Secondary | ICD-10-CM | POA: Diagnosis not present

## 2020-12-04 DIAGNOSIS — I10 Essential (primary) hypertension: Secondary | ICD-10-CM | POA: Diagnosis present

## 2020-12-04 DIAGNOSIS — E039 Hypothyroidism, unspecified: Secondary | ICD-10-CM | POA: Insufficient documentation

## 2020-12-04 DIAGNOSIS — I25110 Atherosclerotic heart disease of native coronary artery with unstable angina pectoris: Secondary | ICD-10-CM | POA: Diagnosis not present

## 2020-12-04 DIAGNOSIS — I4891 Unspecified atrial fibrillation: Secondary | ICD-10-CM | POA: Diagnosis not present

## 2020-12-04 DIAGNOSIS — Z20822 Contact with and (suspected) exposure to covid-19: Secondary | ICD-10-CM | POA: Insufficient documentation

## 2020-12-04 DIAGNOSIS — Z79899 Other long term (current) drug therapy: Secondary | ICD-10-CM | POA: Diagnosis not present

## 2020-12-04 DIAGNOSIS — R000 Tachycardia, unspecified: Secondary | ICD-10-CM | POA: Diagnosis not present

## 2020-12-04 DIAGNOSIS — Z7984 Long term (current) use of oral hypoglycemic drugs: Secondary | ICD-10-CM | POA: Diagnosis not present

## 2020-12-04 DIAGNOSIS — R531 Weakness: Secondary | ICD-10-CM | POA: Diagnosis not present

## 2020-12-04 LAB — BASIC METABOLIC PANEL
Anion gap: 9 (ref 5–15)
BUN: 13 mg/dL (ref 8–23)
CO2: 25 mmol/L (ref 22–32)
Calcium: 9.8 mg/dL (ref 8.9–10.3)
Chloride: 104 mmol/L (ref 98–111)
Creatinine, Ser: 1.1 mg/dL (ref 0.61–1.24)
GFR, Estimated: >60 mL/min (ref >60)
Glucose, Bld: 132 mg/dL — ABNORMAL HIGH (ref 70–99)
Potassium: 4.3 mmol/L (ref 3.5–5.1)
Sodium: 138 mmol/L (ref 135–145)

## 2020-12-04 LAB — CBC
HCT: 43.9 % (ref 39.0–52.0)
HCT: 41.3 % (ref 39.0–52.0)
Hemoglobin: 14.4 g/dL (ref 13.0–17.0)
Hemoglobin: 13.4 g/dL (ref 13.0–17.0)
MCH: 29.9 pg (ref 26.0–34.0)
MCH: 29.6 pg (ref 26.0–34.0)
MCHC: 32.8 g/dL (ref 30.0–36.0)
MCHC: 32.4 g/dL (ref 30.0–36.0)
MCV: 91.1 fL (ref 80.0–100.0)
MCV: 91.2 fL (ref 80.0–100.0)
Platelets: 267 10*3/uL (ref 150–400)
Platelets: 237 10*3/uL (ref 150–400)
RBC: 4.82 MIL/uL (ref 4.22–5.81)
RBC: 4.53 MIL/uL (ref 4.22–5.81)
RDW: 13.3 % (ref 11.5–15.5)
RDW: 13.5 % (ref 11.5–15.5)
WBC: 9 10*3/uL (ref 4.0–10.5)
WBC: 7.2 10*3/uL (ref 4.0–10.5)
nRBC: 0 % (ref 0.0–0.2)
nRBC: 0 % (ref 0.0–0.2)

## 2020-12-04 LAB — URINALYSIS, ROUTINE W REFLEX MICROSCOPIC
Bilirubin Urine: NEGATIVE
Glucose, UA: NEGATIVE mg/dL
Hgb urine dipstick: NEGATIVE
Ketones, ur: NEGATIVE mg/dL
Leukocytes,Ua: NEGATIVE
Nitrite: NEGATIVE
Protein, ur: NEGATIVE mg/dL
Specific Gravity, Urine: 1.008 (ref 1.005–1.030)
pH: 6 (ref 5.0–8.0)

## 2020-12-04 LAB — TROPONIN I (HIGH SENSITIVITY)
Troponin I (High Sensitivity): 5 ng/L (ref <18)
Troponin I (High Sensitivity): 4 ng/L (ref <18)

## 2020-12-04 LAB — CREATININE, SERUM
Creatinine, Ser: 1.07 mg/dL (ref 0.61–1.24)
GFR, Estimated: >60 mL/min (ref >60)

## 2020-12-04 LAB — CBG MONITORING, ED
Glucose-Capillary: 124 mg/dL — ABNORMAL HIGH (ref 70–99)
Glucose-Capillary: 175 mg/dL — ABNORMAL HIGH (ref 70–99)

## 2020-12-04 LAB — HEMOGLOBIN A1C
Hgb A1c MFr Bld: 7.8 % — ABNORMAL HIGH (ref 4.8–5.6)
Mean Plasma Glucose: 177.16 mg/dL

## 2020-12-04 LAB — BRAIN NATRIURETIC PEPTIDE: B Natriuretic Peptide: 147.5 pg/mL — ABNORMAL HIGH (ref 0.0–100.0)

## 2020-12-04 MED ORDER — NITROGLYCERIN 0.4 MG SL SUBL: 0.4000 mg | SUBLINGUAL_TABLET | SUBLINGUAL | Status: DC | PRN

## 2020-12-04 MED ORDER — PROSIGHT PO TABS
1.0000 | ORAL_TABLET | Freq: Two times a day (BID) | ORAL | Status: DC
  Administered 2020-12-05: 1 via ORAL
  Filled 2020-12-04 (×2): qty 1

## 2020-12-04 MED ORDER — ASCORBIC ACID 500 MG PO TABS
1000.0000 mg | ORAL_TABLET | Freq: Every day | ORAL | Status: DC
  Administered 2020-12-05: 1000 mg via ORAL
  Filled 2020-12-04: qty 2

## 2020-12-04 MED ORDER — CLOPIDOGREL BISULFATE 75 MG PO TABS
75.0000 mg | ORAL_TABLET | Freq: Every day | ORAL | Status: DC
  Administered 2020-12-05: 75 mg via ORAL
  Filled 2020-12-04: qty 1

## 2020-12-04 MED ORDER — ROSUVASTATIN CALCIUM 5 MG PO TABS: 10.0000 mg | ORAL_TABLET | Freq: Every evening | ORAL | Status: DC

## 2020-12-04 MED ORDER — FUROSEMIDE 40 MG PO TABS
40.0000 mg | ORAL_TABLET | Freq: Two times a day (BID) | ORAL | Status: DC
  Administered 2020-12-05: 40 mg via ORAL
  Filled 2020-12-04: qty 1

## 2020-12-04 MED ORDER — ENOXAPARIN SODIUM 40 MG/0.4ML IJ SOSY
40.0000 mg | PREFILLED_SYRINGE | Freq: Every day | INTRAMUSCULAR | Status: DC
  Administered 2020-12-04: 40 mg via SUBCUTANEOUS
  Filled 2020-12-04: qty 0.4

## 2020-12-04 MED ORDER — METOPROLOL SUCCINATE ER 50 MG PO TB24
150.0000 mg | ORAL_TABLET | Freq: Every day | ORAL | Status: DC
  Administered 2020-12-05: 150 mg via ORAL
  Filled 2020-12-04: qty 1

## 2020-12-04 MED ORDER — METFORMIN HCL 500 MG PO TABS
500.0000 mg | ORAL_TABLET | Freq: Two times a day (BID) | ORAL | Status: DC
  Administered 2020-12-04 – 2020-12-05 (×2): 500 mg via ORAL
  Filled 2020-12-04 (×2): qty 1

## 2020-12-04 MED ORDER — LEVOTHYROXINE SODIUM 50 MCG PO TABS: 50.0000 ug | ORAL_TABLET | Freq: Every day | ORAL | Status: DC

## 2020-12-04 MED ORDER — DOCUSATE SODIUM 100 MG PO CAPS: 100.0000 mg | ORAL_CAPSULE | Freq: Every day | ORAL | Status: DC | PRN

## 2020-12-04 MED ORDER — DIAZEPAM 2 MG PO TABS: 2.0000 mg | ORAL_TABLET | Freq: Two times a day (BID) | ORAL | Status: DC | PRN

## 2020-12-04 MED ORDER — POTASSIUM CHLORIDE CRYS ER 20 MEQ PO TBCR
20.0000 meq | EXTENDED_RELEASE_TABLET | Freq: Two times a day (BID) | ORAL | Status: DC
  Administered 2020-12-04 – 2020-12-05 (×2): 20 meq via ORAL
  Filled 2020-12-04 (×2): qty 1

## 2020-12-04 MED ORDER — ADULT MULTIVITAMIN W/MINERALS CH
1.0000 | ORAL_TABLET | Freq: Every day | ORAL | Status: DC
  Administered 2020-12-05: 1 via ORAL
  Filled 2020-12-04: qty 1

## 2020-12-04 MED ORDER — SODIUM CHLORIDE 0.9% FLUSH
3.0000 mL | Freq: Two times a day (BID) | INTRAVENOUS | Status: DC
  Administered 2020-12-05 (×2): 3 mL via INTRAVENOUS

## 2020-12-04 MED ORDER — ASPIRIN EC 81 MG PO TBEC
81.0000 mg | DELAYED_RELEASE_TABLET | Freq: Every evening | ORAL | Status: DC
  Administered 2020-12-04: 81 mg via ORAL
  Filled 2020-12-04: qty 1

## 2020-12-04 NOTE — ED Provider Notes (Signed)
[facility] EMERGENCY DEPARTMENT Provider Note   CSN: [id number] Arrival date & time: 12/04/20  1213     History Chief Complaint  Patient presents with   Loss of Consciousness    Peter Brooks is a 75 y.o. male.  HPI Patient with history of CAD, A. fib, presents after episode of syncope.  Patient is a [employer].  He notes that he went to bed yesterday in his usual state of health, taking medication as directed.  Today he had dyspnea, both at rest and while walking the dog.  There is no chest pain.  Patient was able to go to his church, prior for service.  However, he recalls that after going to his office to prepare, he soon thereafter seemingly lost consciousness.  He was reportedly found sometime thereafter in his chair, by family and clergyman. No pain that he recalls either before or afterwards.  No fever, no vomiting.  Currently he complains of feeling weak, dyspneic. His recent evaluation includes continuous Holter monitoring, during which she may have been diagnosed with A. fib.  He is on anticoagulant.     Past Medical History:  Diagnosis Date   Anxiety    Arthritis    CAD (coronary artery disease)    LHC 02/18/2020: prior stent to RPDA patent, 90% stenosis of OM1 s/p PTCA/DES, 50% 1st Diag, 50% proximal to mid LAD, 20% distal LAD, 30% ostial to proximal CX, 30% OM2.   Cataract    removed bilaterally    Chest pain    Diabetes mellitus without complication (HCC)    Dizziness    GERD (gastroesophageal reflux disease)    Hyperlipidemia    Hypertension    Hypothyroidism    Lumbar herniated disc     Patient Active Problem List   Diagnosis Date Noted   Shortness of breath    Type 2 diabetes mellitus with complication, without long-term current use of insulin ([place]) 02/18/2020   Hypothyroidism 02/18/2020   Unstable angina ([place])    Vertigo 03/19/2018   NSVT (nonsustained ventricular tachycardia) ([place]) 03/18/2018   Chest pain 08/09/2011   DIZZINESS  11/30/2008   HYPERCHOLESTEROLEMIA 10/28/2008   Hyperlipidemia 10/28/2008   HYPERTENSION, BENIGN 10/28/2008   Essential hypertension 10/28/2008   CAD (coronary artery disease) 10/28/2008   GERD 10/28/2008   CHEST PAIN-PRECORDIAL 10/28/2008    Past Surgical History:  Procedure Laterality Date   CARDIAC CATHETERIZATION  11/11/2008   CATARACT EXTRACTION Right    CATARACT EXTRACTION Left    COLONOSCOPY     CORONARY STENT INTERVENTION N/A 03/11/2018   Procedure: CORONARY STENT INTERVENTION;  Surgeon: Martinique, Peter M, MD;  Location: [place] [facility];  Service: Cardiovascular;  Laterality: N/A;   CORONARY STENT INTERVENTION N/A 02/18/2020   Procedure: CORONARY STENT INTERVENTION;  Surgeon: Burnell Blanks, MD;  Location: [place] [facility];  Service: Cardiovascular;  Laterality: N/A;   KNEE ARTHROSCOPY Bilateral 2010 Right   POLYPECTOMY     RIGHT/LEFT HEART CATH AND CORONARY ANGIOGRAPHY N/A 03/11/2018   Procedure: RIGHT/LEFT HEART CATH AND CORONARY ANGIOGRAPHY;  Surgeon: Martinique, Peter M, MD;  Location: [place] [facility];  Service: Cardiovascular;  Laterality: N/A;   RIGHT/LEFT HEART CATH AND CORONARY ANGIOGRAPHY N/A 02/18/2020   Procedure: RIGHT/LEFT HEART CATH AND CORONARY ANGIOGRAPHY;  Surgeon: Burnell Blanks, MD;  Location: [place] [facility];  Service: Cardiovascular;  Laterality: N/A;   RIGHT/LEFT HEART CATH AND CORONARY ANGIOGRAPHY N/A 09/06/2020   Procedure: RIGHT/LEFT HEART CATH AND CORONARY ANGIOGRAPHY;  Surgeon: Belva Crome, MD;  Location: [place] [facility];  Service: Cardiovascular;  Laterality: N/A;       Family History  Problem Relation Age of Onset   Colon cancer Paternal Uncle 72   Coronary artery disease Father        with CABG valve replacement in his 76's   Heart attack Mother        in her 59's   Stomach cancer Paternal Grandmother    Esophageal cancer Neg Hx    Rectal cancer Neg Hx    Colon polyps Neg Hx     Social History   Tobacco Use    Smoking status: Never   Smokeless tobacco: Never  Vaping Use   Vaping Use: Never used  Substance Use Topics   Alcohol use: No   Drug use: No    Home Medications Prior to Admission medications   Medication Sig Start Date End Date Taking? Authorizing Provider  Ascorbic Acid (VITAMIN C) 1000 MG tablet Take 1,000 mg by mouth daily.    [provider]  aspirin EC 81 MG tablet Take 81 mg by mouth every evening.     [provider]  Blood Glucose Monitoring Suppl (ONE TOUCH ULTRA 2) w/Device KIT See admin instructions. 12/08/18   [provider]  Calcium Carbonate-Vitamin D 600-400 MG-UNIT tablet Take 1 tablet by mouth daily.    [provider]  clopidogrel (PLAVIX) 75 MG tablet Take 1 tablet (75 mg total) by mouth daily. 11/15/20   Burnell Blanks, MD  Coenzyme Q10 (COQ10) 200 MG CAPS Take 200 mg by mouth daily.    [provider]  diazepam (VALIUM) 2 MG tablet Take 1 tablet (2 mg total) by mouth every 12 (twelve) hours as needed for anxiety. 07/11/15   Burnell Blanks, MD  docusate sodium (COLACE) 100 MG capsule Take 100 mg by mouth daily as needed for mild constipation.    [provider]  furosemide (LASIX) 40 MG tablet Take 1 tablet (40 mg total) by mouth 2 (two) times daily. 03/17/20   Burnell Blanks, MD  levothyroxine (SYNTHROID, LEVOTHROID) 50 MCG tablet Take 50 mcg by mouth daily.    [provider]  metFORMIN (GLUCOPHAGE) 500 MG tablet Take 500 mg by mouth 2 (two) times daily.    [provider]  metoprolol succinate (TOPROL XL) 100 MG 24 hr tablet Take 1.5 tablets (150 mg total) by mouth daily. 10/14/20   Richardson Dopp T, PA-C  Multiple Vitamin (MULITIVITAMIN WITH MINERALS) TABS Take 1 tablet by mouth daily.    [provider]  Multiple Vitamins-Minerals (PRESERVISION AREDS 2) CAPS Take 1 capsule by mouth 2 (two) times daily.     [provider]  nitroGLYCERIN (NITROSTAT)  0.4 MG SL tablet Place 1 tablet (0.4 mg total) under the tongue every 5 (five) minutes as needed. 02/18/20   Sande Rives E, PA-C  ONETOUCH ULTRA test strip USE 1 STRIP TO CHECK GLUCOSE ONCE DAILY 12/08/18   [provider]  potassium chloride SA (KLOR-CON) 20 MEQ tablet Take 1 tablet (20 mEq total) by mouth 2 (two) times daily. 03/17/20   Burnell Blanks, MD  Probiotic Product (PROBIOTIC DAILY PO) Take 1 tablet by mouth daily.    [provider]  rosuvastatin (CRESTOR) 10 MG tablet Take 10 mg by mouth every evening. 08/16/20   [provider]    Allergies    Cefdinir  Review of Systems   Review of Systems  Constitutional:        Per HPI, otherwise negative  HENT:         Per HPI, otherwise negative  Respiratory:         Per HPI, otherwise negative  Cardiovascular:        Per HPI, otherwise negative  Gastrointestinal:  Negative for vomiting.  Endocrine:       Negative aside from HPI  Genitourinary:        Neg aside from HPI   Musculoskeletal:        Per HPI, otherwise negative  Skin: Negative.   Neurological:  Positive for syncope.   Physical Exam Updated Vital Signs BP (!) 141/81   Pulse (!) 56   Temp 97.8 F (36.6 C)   Resp 15   SpO2 95%   Physical Exam Vitals and nursing note reviewed.  Constitutional:      General: He is not in acute distress.    Appearance: He is well-developed.  HENT:     Head: Normocephalic and atraumatic.  Eyes:     Conjunctiva/sclera: Conjunctivae normal.  Cardiovascular:     Rate and Rhythm: Normal rate and regular rhythm.  Pulmonary:     Effort: Pulmonary effort is normal. No respiratory distress.     Breath sounds: No stridor.  Abdominal:     General: There is no distension.  Musculoskeletal:     Right lower leg: Edema present.     Left lower leg: Edema present.  Skin:    General: Skin is warm and dry.  Neurological:     Mental Status: He is alert and oriented to person, place, and time.     ED Results / Procedures / Treatments   Labs (all labs ordered are listed, but only abnormal results are displayed) Labs Reviewed  BASIC METABOLIC PANEL - Abnormal; Notable for the following components:      Result Value   Glucose, Bld 132 (*)    All other components within normal limits  URINALYSIS, ROUTINE W REFLEX MICROSCOPIC - Abnormal; Notable for the following components:   Color, Urine STRAW (*)    All other components within normal limits  BRAIN NATRIURETIC PEPTIDE - Abnormal; Notable for the following components:   B Natriuretic Peptide 147.5 (*)    All other components within normal limits  CBG MONITORING, ED - Abnormal; Notable for the following components:   Glucose-Capillary 124 (*)    All other components within normal limits  CBC  TROPONIN I (HIGH SENSITIVITY)  TROPONIN I (HIGH SENSITIVITY)    EKG EKG Interpretation  Date/Time:  Sunday December 04 2020 12:16:43 EDT Ventricular Rate:  55 PR Interval:  186 QRS Duration: 134 QT Interval:  440 QTC Calculation: 420 R Axis:   -18 Text Interpretation: Sinus bradycardia Right bundle branch block Minimal voltage criteria for LVH, may be normal variant ( R in aVL ) Abnormal ECG Confirmed by Carmin Muskrat [phone]) on 12/04/2020 12:51:44 PM  Radiology DG Chest Port 1 View  Result Date: 12/04/2020 CLINICAL DATA:  Pt experienced LOC earlier today while working at his desk. Family member found him slumped over desk with no memory of feeling faint. Unsure how long he was unconscious. Now states he just feels weak. Hx of stents, tachycardia EXAM: PORTABLE CHEST - 1 VIEW COMPARISON:  08/08/2011 FINDINGS: Lungs are clear. Heart size and mediastinal contours are within normal limits. No effusion.  No pneumothorax. Visualized bones unremarkable. IMPRESSION: No acute cardiopulmonary disease. Electronically Signed   By: Keturah Barre  Vernard Gambles M.D.   On: 12/04/2020 14:03    Procedures Procedures   Medications Ordered in ED Medications - No  data to display  ED Course  I have reviewed the triage vital signs and the nursing notes.  Pertinent labs & imaging results that were available during my care of the patient were reviewed by me and considered in my medical decision making (see chart for details).   3:15 PM Patient awake and alert.  He has had episodes of nonsustained A. fib on monitor, remains largely in sinus rhythm.  He has no pain.  He is now committed by his wife.  She was concerned about the episode of syncope, we discussed at length. Findings thus far, reassuring, normal troponin, negative urinalysis, chest x-ray reviewed, no acute findings. Given his age, elevated risk profile per [facility] syncope rules, patient will be admitted for monitoring, management. MDM Rules/Calculators/A&P MDM Number of Diagnoses or Management Options Syncope and collapse: new, needed workup   Amount and/or Complexity of Data Reviewed Clinical lab tests: ordered and reviewed Tests in the radiology section of CPT: ordered and reviewed Tests in the medicine section of CPT: reviewed and ordered Decide to obtain previous medical records or to obtain history from someone other than the patient: yes Obtain history from someone other than the patient: yes Review and summarize past medical records: yes Discuss the patient with other providers: yes Independent visualization of images, tracings, or specimens: yes  Risk of Complications, Morbidity, and/or Mortality Presenting problems: high Diagnostic procedures: high Management options: high  Critical Care Total time providing critical care: < 30 minutes  Patient Progress Patient progress: stable   Final Clinical Impression(s) / ED Diagnoses Final diagnoses:  Syncope and collapse     Carmin Muskrat, MD 12/04/20 1516

## 2020-12-04 NOTE — H&P (Addendum)
History and Physical  Peter Brooks:[id number] DOB: Sep 19, 1945 DOA: 12/04/2020  Referring physician: Dr. Vanita Panda PCP: Street, [place], Brooks  Outpatient Specialists: Cardiology Patient coming from: Home & is able to ambulate ambulatory  Chief Complaint: Collapse with loss of consciousness  HPI: Peter Brooks is a 75 y.o. male with medical history significant for this is a 75 year old male with past medical history significant for coronary disease SP sten in February 18, 2020 recently and SP cardiac catheterization recently, May 2022 atrial fibrillation, type 2 diabetes mellitus, hyperlipidemia, hyperlipidemia, hypothyroidism, who presented to the emergency department by EMS because he collapsed in his office at discharge as he was preparing for his 7.  He remembered that about 10:00 he was flipping the pages on his device trying to find his semen and does not remember anything else and around 1055 his wife came to the office to check on him since he was supposed to be at appropriate and was not there yet.  She stated that he seemed a little confused and his lips was pale looking.  Patient denied chest pain nausea vomiting.  Denies any headache no vertigo no tinnitus.  He does have edema of his lower extremity for which she takes Lasix 80 mg daily.  He is feeling weak still He was recently evaluated in May with stress test and a Holter monitor and was diagnosed with A. fib or SVT.  He is not on anticoagulation  , ED Course: During the monitoring in the ED he was noted to have several episodes of nonsustained A. fib on the monitor in the ED his EKG showed bundle branch block with sinus tachycardia possible LVH.  His troponins are negative so far.  His BNP was 147.5, UA was normal glucose was within normal limit of 124 random, head CT showed no acute intracranial process,CBC and chemistry within normal  Review of Systems: As noted above/review of systems are otherwise negative   Past  Medical History:  Diagnosis Date   Anxiety    Arthritis    CAD (coronary artery disease)    LHC 02/18/2020: prior stent to RPDA patent, 90% stenosis of OM1 s/p PTCA/DES, 50% 1st Diag, 50% proximal to mid LAD, 20% distal LAD, 30% ostial to proximal CX, 30% OM2.   Cataract    removed bilaterally    Chest pain    Diabetes mellitus without complication ([place])    Dizziness    GERD (gastroesophageal reflux disease)    Hyperlipidemia    Hypertension    Hypothyroidism    Lumbar herniated disc    Past Surgical History:  Procedure Laterality Date   CARDIAC CATHETERIZATION  11/11/2008   CATARACT EXTRACTION Right    CATARACT EXTRACTION Left    COLONOSCOPY     CORONARY STENT INTERVENTION N/A 03/11/2018   Procedure: CORONARY STENT INTERVENTION;  Surgeon: Peter Brooks;  Location: [place] [facility];  Service: Cardiovascular;  Laterality: N/A;   CORONARY STENT INTERVENTION N/A 02/18/2020   Procedure: CORONARY STENT INTERVENTION;  Surgeon: Peter Brooks;  Location: [place] [facility];  Service: Cardiovascular;  Laterality: N/A;   KNEE ARTHROSCOPY Bilateral 2010 Right   POLYPECTOMY     RIGHT/LEFT HEART CATH AND CORONARY ANGIOGRAPHY N/A 03/11/2018   Procedure: RIGHT/LEFT HEART CATH AND CORONARY ANGIOGRAPHY;  Surgeon: Peter Brooks;  Location: [place] [facility];  Service: Cardiovascular;  Laterality: N/A;   RIGHT/LEFT HEART CATH AND CORONARY ANGIOGRAPHY N/A 02/18/2020   Procedure: RIGHT/LEFT HEART CATH  AND CORONARY ANGIOGRAPHY;  Surgeon: Peter Brooks;  Location: [place] [facility];  Service: Cardiovascular;  Laterality: N/A;   RIGHT/LEFT HEART CATH AND CORONARY ANGIOGRAPHY N/A 09/06/2020   Procedure: RIGHT/LEFT HEART CATH AND CORONARY ANGIOGRAPHY;  Surgeon: Peter Brooks;  Location: [place] [facility];  Service: Cardiovascular;  Laterality: N/A;    Social History:  reports that he has never smoked. He has never used smokeless tobacco. He reports that he  does not drink alcohol and does not use drugs.   Allergies  Allergen Reactions   Cefdinir Other (See Comments)    Swelling of the throat    Family History  Problem Relation Age of Onset   Colon cancer Paternal Uncle 37   Coronary artery disease Father        with CABG valve replacement in his 46's   Heart attack Mother        in her 28's   Stomach cancer Paternal Grandmother    Esophageal cancer Neg Hx    Rectal cancer Neg Hx    Colon polyps Neg Hx        Prior to Admission medications   Medication Sig Start Date End Date Taking? Authorizing Provider  Ascorbic Acid (VITAMIN C) 1000 MG tablet Take 1,000 mg by mouth daily.    Provider, Historical, Brooks  aspirin EC 81 MG tablet Take 81 mg by mouth every evening.     Provider, Historical, Brooks  Blood Glucose Monitoring Suppl (ONE TOUCH ULTRA 2) w/Device KIT See admin instructions. 12/08/18   Provider, Historical, Brooks  Calcium Carbonate-Vitamin D 600-400 MG-UNIT tablet Take 1 tablet by mouth daily.    Provider, Historical, Brooks  clopidogrel (PLAVIX) 75 MG tablet Take 1 tablet (75 mg total) by mouth daily. 11/15/20   Peter Brooks  Coenzyme Q10 (COQ10) 200 MG CAPS Take 200 mg by mouth daily.    Provider, Historical, Brooks  diazepam (VALIUM) 2 MG tablet Take 1 tablet (2 mg total) by mouth every 12 (twelve) hours as needed for anxiety. 07/11/15   Peter Brooks  docusate sodium (COLACE) 100 MG capsule Take 100 mg by mouth daily as needed for mild constipation.    Provider, Historical, Brooks  furosemide (LASIX) 40 MG tablet Take 1 tablet (40 mg total) by mouth 2 (two) times daily. 03/17/20   Peter Brooks  levothyroxine (SYNTHROID, LEVOTHROID) 50 MCG tablet Take 50 mcg by mouth daily.    Provider, Historical, Brooks  metFORMIN (GLUCOPHAGE) 500 MG tablet Take 500 mg by mouth 2 (two) times daily.    Provider, Historical, Brooks  metoprolol succinate (TOPROL XL) 100 MG 24 hr tablet Take 1.5 tablets (150 mg total) by mouth  daily. 10/14/20   Peter Brooks  Multiple Vitamin (MULITIVITAMIN WITH MINERALS) TABS Take 1 tablet by mouth daily.    Provider, Historical, Brooks  Multiple Vitamins-Minerals (PRESERVISION AREDS 2) CAPS Take 1 capsule by mouth 2 (two) times daily.     Provider, Historical, Brooks  nitroGLYCERIN (NITROSTAT) 0.4 MG SL tablet Place 1 tablet (0.4 mg total) under the tongue every 5 (five) minutes as needed. 02/18/20   Sande Rives E, Brooks  ONETOUCH ULTRA test strip USE 1 STRIP TO CHECK GLUCOSE ONCE DAILY 12/08/18   Provider, Historical, Brooks  potassium chloride SA (KLOR-CON) 20 MEQ tablet Take 1 tablet (20 mEq total) by mouth 2 (two) times daily. 03/17/20   Peter Brooks  Probiotic Product (PROBIOTIC DAILY PO) Take  1 tablet by mouth daily.    Provider, Historical, Brooks  rosuvastatin (CRESTOR) 10 MG tablet Take 10 mg by mouth every evening. 08/16/20   Provider, Historical, Brooks    Physical Exam: BP (!) 152/79   Pulse (!) 59   Temp 97.8 F (36.6 C)   Resp (!) 24   SpO2 93%   Exam:  General: 75 y.o. year-old male well developed well nourished in no acute distress.  Alert and oriented x3.  Pleasant no distress overweight Cardiovascular: Regular rate and rhythm with no rubs or gallops.  No thyromegaly or JVD noted.  Probably systolic murmur mild 2/6 Respiratory: Clear to auscultation with no wheezes or rales. Good inspiratory effort. Abdomen: Soft nontender nondistended with normal bowel sounds x4 quadrants. Musculoskeletal: Trace lower extremity edema. 2/4 pulses in all 4 extremities. Skin: No ulcerative lesions noted or rashes, Psychiatry: Mood is appropriate for condition and setting           Labs on Admission:  Basic Metabolic Panel: Recent Labs  Lab 12/04/20 1233  NA 138  K 4.3  CL 104  CO2 25  GLUCOSE 132*  BUN 13  CREATININE 1.10  CALCIUM 9.8   Liver Function Tests: No results for input(s): AST, ALT, ALKPHOS, BILITOT, PROT, ALBUMIN in the last 168 hours. No  results for input(s): LIPASE, AMYLASE in the last 168 hours. No results for input(s): AMMONIA in the last 168 hours. CBC: Recent Labs  Lab 12/04/20 1233  WBC 9.0  HGB 14.4  HCT 43.9  MCV 91.1  PLT 267   Cardiac Enzymes: No results for input(s): CKTOTAL, CKMB, CKMBINDEX, TROPONINI in the last 168 hours.  BNP (last 3 results) Recent Labs    12/04/20 1233  BNP 147.5*    ProBNP (last 3 results) Recent Labs    08/23/20 1024  PROBNP 137    CBG: Recent Labs  Lab 12/04/20 1327  GLUCAP 124*    Radiological Exams on Admission: CT Head Wo Contrast  Result Date: 12/04/2020 CLINICAL DATA:  Syncope, history of atrial fibrillation and coronary artery disease EXAM: CT HEAD WITHOUT CONTRAST TECHNIQUE: Contiguous axial images were obtained from the base of the skull through the vertex without intravenous contrast. COMPARISON:  08/08/2011 FINDINGS: Brain: No acute infarct or hemorrhage. Lateral ventricles and midline structures are unremarkable. No acute extra-axial fluid collections. No mass effect. Vascular: No hyperdense vessel or unexpected calcification. Skull: Normal. Negative for fracture or focal lesion. Sinuses/Orbits: No acute finding. Other: None. IMPRESSION: 1. No acute intracranial process. Electronically Signed   By: Randa Ngo M.D.   On: 12/04/2020 16:12   DG Chest Port 1 View  Result Date: 12/04/2020 CLINICAL DATA:  Pt experienced LOC earlier today while working at his desk. Family member found him slumped over desk with no memory of feeling faint. Unsure how long he was unconscious. Now states he just feels weak. Hx of stents, tachycardia EXAM: PORTABLE CHEST - 1 VIEW COMPARISON:  08/08/2011 FINDINGS: Lungs are clear. Heart size and mediastinal contours are within normal limits. No effusion.  No pneumothorax. Visualized bones unremarkable. IMPRESSION: No acute cardiopulmonary disease. Electronically Signed   By: Lucrezia Europe M.D.   On: 12/04/2020 14:03    EKG:  Independently reviewed.  Sinus tachycardia right bundle branch block possible LVH  Assessment/Plan Present on Admission:  Syncope  Syncope and collapse  Active Problems:   Syncope   Syncope and collapse  #1 syncope with collapse patient may have had an arrhythmia as he is still  showing some SVT/A. fib on monitor Consult cardiology in the morning   Coronary artery disease.  Patient recently had a stress cardiac catheterization which was normal.  Patient will be admitted to telemetry Consult cardiology in the morning  Hypertension we will continue his home medicine  Hypercholesterolemia continue on Crestor  Hypothyroidism continue Synthroid 50 mcg daily  Type 2 diabetes mellitus on metformin we will initiate sliding scale and continue on metformin  Lower extremity edema continue Lasix 40 mg twice daily we will start him on TED hose  Severity of Illness:  Patient will be admitted to observation she warrants overnight stay for monitoring  DVT prophylaxis: Lovenox  Code Status: Full  Family Communication: Wife at bedside  Disposition Plan: Home when stable  Consults called: None  Admission status: Observation/telemetry    Cristal Deer Brooks [facility] Pager [phone]  If 7PM-7AM, please contact night-coverage [email or web address] Password [facility]  12/04/2020, 8:22 PM

## 2020-12-04 NOTE — ED Provider Notes (Signed)
  Physical Exam  BP (!) 145/77   Pulse 60   Temp 97.8 F (36.6 C)   Resp (!) 22   SpO2 92%   Physical Exam  ED Course/Procedures     Procedures  MDM  Care assumed at 3:30 pm. Sign out pending hospitalist consult.   4:05 pm Talked to Dr. Kyung Bacca from [facility]. She states that she will come and admit patient   8:04 PM Patient still have no orders placed. Repaged hospitalist for admission.      Drenda Freeze, MD 12/05/20 0130

## 2020-12-04 NOTE — ED Triage Notes (Signed)
Patient coming from church. 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 Unknown how long, wife found him. Hx of stents, tachycardia.

## 2020-12-05 ENCOUNTER — Encounter (HOSPITAL_COMMUNITY): Payer: Self-pay | Admitting: Family Medicine

## 2020-12-05 ENCOUNTER — Inpatient Hospital Stay (HOSPITAL_BASED_OUTPATIENT_CLINIC_OR_DEPARTMENT_OTHER): Payer: PPO

## 2020-12-05 DIAGNOSIS — R55 Syncope and collapse: Secondary | ICD-10-CM

## 2020-12-05 DIAGNOSIS — I10 Essential (primary) hypertension: Secondary | ICD-10-CM | POA: Diagnosis not present

## 2020-12-05 DIAGNOSIS — I2510 Atherosclerotic heart disease of native coronary artery without angina pectoris: Secondary | ICD-10-CM | POA: Diagnosis not present

## 2020-12-05 LAB — GLUCOSE, CAPILLARY
Glucose-Capillary: 145 mg/dL — ABNORMAL HIGH (ref 70–99)
Glucose-Capillary: 197 mg/dL — ABNORMAL HIGH (ref 70–99)

## 2020-12-05 LAB — SARS CORONAVIRUS 2 (TAT 6-24 HRS): SARS Coronavirus 2: NEGATIVE

## 2020-12-05 NOTE — ED Notes (Signed)
Report Larita Fife, RN of [phone]

## 2020-12-05 NOTE — Plan of Care (Signed)
  Problem: Health Behavior/Discharge Planning: Goal: Ability to manage health-related needs will improve Outcome: Progressing   Problem: Clinical Measurements: Goal: Will remain free from infection Outcome: Progressing Goal: Diagnostic test results will improve Outcome: Progressing   

## 2020-12-05 NOTE — Discharge Summary (Signed)
Physician Discharge Summary  Peter Brooks:[id number] DOB: April 06, 1946 DOA: 12/04/2020  PCP: Venetia Maxon, Sharon Mt, MD  Admit date: 12/04/2020 Discharge date: 12/05/2020  Admitted From: Home Disposition:  Home  Recommendations for Outpatient Follow-up:  Follow up with PCP in 1-2 weeks Follow up with Cardiology as scheduled  Discharge Condition:Stable CODE STATUS:Full Diet recommendation: Heart healthy, diabetic   Brief/Interim Summary: 75yo who presents with syncope on 7/17 while using a laptop in his office from a seated position. Patient presented for workup for syncope.  Discharge Diagnoses:  Principal Problem:   Syncope Active Problems:   Essential hypertension   CAD (coronary artery disease)  Neurocardiogenic syncope -Presenting with acute syncope without presyncopal symptoms, no witnessed seizure like activity -Pt is on lasix prior to admit. Reported working outdoors in the heat day prior to symptoms -Family report pt appeared pale upon awakening  -No post-ictal symptoms reported or observed by family  -Given hx of intermittent palpitations followed by Cardiology, had consulted Cardiology in-house. Appreciate input. Per Cardiology, no identifiable issue that would warrant further in hospital stay.  -patient is recommended to follow up closely with Cardiology as originally scheduled and to stay well hydrated in the outdoor heat  CAD -Followed by Cardiology. Pt is s/p stenting -Continue antiplatelet as tolerated  HTN -BP remained stable -Cont antihypertensive meds as tolerated  HLD -cont on statin as tolerated  Hypothyroid -Most recent TSH of 2.150 from 2019  DM2 -Glucose trends stable -Cont home regimen on d/c   Discharge Instructions   Allergies as of 12/05/2020       Reactions   Cefdinir Other (See Comments)   Swelling of the throat        Medication List     TAKE these medications    amLODipine 5 MG tablet Commonly known as:  NORVASC Take 5 mg by mouth daily.   aspirin EC 81 MG tablet Take 81 mg by mouth every evening.   Calcium Carbonate-Vitamin D 600-400 MG-UNIT tablet Take 1 tablet by mouth daily.   clopidogrel 75 MG tablet Commonly known as: PLAVIX Take 1 tablet (75 mg total) by mouth daily.   CoQ10 200 MG Caps Take 200 mg by mouth daily.   diazepam 2 MG tablet Commonly known as: VALIUM Take 1 tablet (2 mg total) by mouth every 12 (twelve) hours as needed for anxiety.   docusate sodium 100 MG capsule Commonly known as: COLACE Take 100 mg by mouth daily as needed for mild constipation.   furosemide 40 MG tablet Commonly known as: LASIX Take 1 tablet (40 mg total) by mouth 2 (two) times daily.   levothyroxine 50 MCG tablet Commonly known as: SYNTHROID Take 50 mcg by mouth daily.   metFORMIN 500 MG tablet Commonly known as: GLUCOPHAGE Take 500 mg by mouth 2 (two) times daily.   metoprolol succinate 100 MG 24 hr tablet Commonly known as: Toprol XL Take 1.5 tablets (150 mg total) by mouth daily.   multivitamin with minerals Tabs tablet Take 1 tablet by mouth daily.   nitroGLYCERIN 0.4 MG SL tablet Commonly known as: Nitrostat Place 1 tablet (0.4 mg total) under the tongue every 5 (five) minutes as needed.   ONE TOUCH ULTRA 2 w/Device Kit See admin instructions.   OneTouch Ultra test strip Generic drug: glucose blood USE 1 STRIP TO CHECK GLUCOSE ONCE DAILY   potassium chloride SA 20 MEQ tablet Commonly known as: KLOR-CON Take 1 tablet (20 mEq total) by mouth 2 (two) times daily.  PreserVision AREDS 2 Caps Take 1 capsule by mouth 2 (two) times daily.   PROBIOTIC DAILY PO Take 1 tablet by mouth daily.   rosuvastatin 10 MG tablet Commonly known as: CRESTOR Take 10 mg by mouth every evening.   vitamin C 1000 MG tablet Take 1,000 mg by mouth daily.        Follow-up Information     Street, Sharon Mt, MD Follow up in 2 week(s).   Specialty: Family Medicine Why:  Hospital follow up Contact information: [place] [address] [phone]         Burnell Blanks, MD .   Specialty: Cardiology Contact information: [address] [place] [address] [phone]                Allergies  Allergen Reactions   Cefdinir Other (See Comments)    Swelling of the throat    Consultations: Cardiology   Procedures/Studies: X-ray chest PA and lateral  Result Date: 12/04/2020 CLINICAL DATA:  Syncope, collapse EXAM: CHEST - 2 VIEW COMPARISON:  12/04/2020 at 1337 hours FINDINGS: Mild patchy left lower lobe opacity, atelectasis versus pneumonia. Mild right basilar opacity, likely atelectasis. No pleural effusion or pneumothorax. The heart is normal in size. Degenerative changes of the thoracic spine. IMPRESSION: Mild patchy left lower lobe opacity, atelectasis versus pneumonia. Electronically Signed   By: Julian Hy M.D.   On: 12/04/2020 21:08   CT Head Wo Contrast  Result Date: 12/04/2020 CLINICAL DATA:  Syncope, history of atrial fibrillation and coronary artery disease EXAM: CT HEAD WITHOUT CONTRAST TECHNIQUE: Contiguous axial images were obtained from the base of the skull through the vertex without intravenous contrast. COMPARISON:  08/08/2011 FINDINGS: Brain: No acute infarct or hemorrhage. Lateral ventricles and midline structures are unremarkable. No acute extra-axial fluid collections. No mass effect. Vascular: No hyperdense vessel or unexpected calcification. Skull: Normal. Negative for fracture or focal lesion. Sinuses/Orbits: No acute finding. Other: None. IMPRESSION: 1. No acute intracranial process. Electronically Signed   By: Randa Ngo M.D.   On: 12/04/2020 16:12   DG Chest Port 1 View  Result Date: 12/04/2020 CLINICAL DATA:  Pt experienced LOC earlier today while working at his desk. Family member found him slumped over desk with no memory of feeling faint. Unsure how long he was  unconscious. Now states he just feels weak. Hx of stents, tachycardia EXAM: PORTABLE CHEST - 1 VIEW COMPARISON:  08/08/2011 FINDINGS: Lungs are clear. Heart size and mediastinal contours are within normal limits. No effusion.  No pneumothorax. Visualized bones unremarkable. IMPRESSION: No acute cardiopulmonary disease. Electronically Signed   By: Lucrezia Europe M.D.   On: 12/04/2020 14:03    Subjective: Eager to go home  Discharge Exam: Vitals:   12/05/20 1139 12/05/20 1144  BP: (!) 160/92 (!) 154/88  Pulse:    Resp: 20 20  Temp:    SpO2:     Vitals:   12/05/20 1133 12/05/20 1137 12/05/20 1139 12/05/20 1144  BP: (!) 152/86 (!) 160/91 (!) 160/92 (!) 154/88  Pulse: 68 68    Resp: '18 20 20 20  ' Temp: 98.1 F (36.7 C)     TempSrc: Oral     SpO2: 95% 96%    Weight:      Height:        General: Pt is alert, awake, not in acute distress Cardiovascular: RRR, S1/S2 + Respiratory: CTA bilaterally, no wheezing, no rhonchi Abdominal: Soft, NT, ND, bowel sounds + Extremities:  no edema, no cyanosis   The results of significant diagnostics from this hospitalization (including imaging, microbiology, ancillary and laboratory) are listed below for reference.     Microbiology: Recent Results (from the past 240 hour(s))  SARS CORONAVIRUS 2 (TAT 6-24 HRS) Nasopharyngeal Nasopharyngeal Swab     Status: None   Collection Time: 12/04/20  9:26 PM   Specimen: Nasopharyngeal Swab  Result Value Ref Range Status   SARS Coronavirus 2 NEGATIVE NEGATIVE Final    Comment: (NOTE) SARS-CoV-2 target nucleic acids are NOT DETECTED.  The SARS-CoV-2 RNA is generally detectable in upper and lower respiratory specimens during the acute phase of infection. Negative results do not preclude SARS-CoV-2 infection, do not rule out co-infections with other pathogens, and should not be used as the sole basis for treatment or other patient management decisions. Negative results must be combined with clinical  observations, patient history, and epidemiological information. The expected result is Negative.  Fact Sheet for Patients: SugarRoll.be  Fact Sheet for Healthcare Providers: https://www.woods-mathews.com/  This test is not yet approved or cleared by the [place] FDA and  has been authorized for detection and/or diagnosis of SARS-CoV-2 by FDA under an Emergency Use Authorization (EUA). This EUA will remain  in effect (meaning this test can be used) for the duration of the COVID-19 declaration under Se ction 564(b)(1) of the Act, 21 U.S.C. section 360bbb-3(b)(1), unless the authorization is terminated or revoked sooner.  Performed at [facility], [place] [address] [place], [place] [address]      Labs: BNP (last 3 results) Recent Labs    12/04/20 1233  BNP 428.7*   Basic Metabolic Panel: Recent Labs  Lab 12/04/20 1233 12/04/20 2104  NA 138  --   K 4.3  --   CL 104  --   CO2 25  --   GLUCOSE 132*  --   BUN 13  --   CREATININE 1.10 1.07  CALCIUM 9.8  --    Liver Function Tests: No results for input(s): AST, ALT, ALKPHOS, BILITOT, PROT, ALBUMIN in the last 168 hours. No results for input(s): LIPASE, AMYLASE in the last 168 hours. No results for input(s): AMMONIA in the last 168 hours. CBC: Recent Labs  Lab 12/04/20 1233 12/04/20 2104  WBC 9.0 7.2  HGB 14.4 13.4  HCT 43.9 41.3  MCV 91.1 91.2  PLT 267 237   Cardiac Enzymes: No results for input(s): CKTOTAL, CKMB, CKMBINDEX, TROPONINI in the last 168 hours. BNP: Invalid input(s): POCBNP CBG: Recent Labs  Lab 12/04/20 1327 12/04/20 2354 12/05/20 0456 12/05/20 1129  GLUCAP 124* 175* 145* 197*   D-Dimer No results for input(s): DDIMER in the last 72 hours. Hgb A1c Recent Labs    12/04/20 2104  HGBA1C 7.8*   Lipid Profile No results for input(s): CHOL, HDL, LDLCALC, TRIG, CHOLHDL, LDLDIRECT in the last 72 hours. Thyroid function studies No results  for input(s): TSH, T4TOTAL, T3FREE, THYROIDAB in the last 72 hours.  Invalid input(s): FREET3 Anemia work up No results for input(s): VITAMINB12, FOLATE, FERRITIN, TIBC, IRON, RETICCTPCT in the last 72 hours. Urinalysis    Component Value Date/Time   COLORURINE STRAW (A) 12/04/2020 1343   APPEARANCEUR CLEAR 12/04/2020 1343   LABSPEC 1.008 12/04/2020 1343   PHURINE 6.0 12/04/2020 1343   GLUCOSEU NEGATIVE 12/04/2020 1343   HGBUR NEGATIVE 12/04/2020 1343   BILIRUBINUR NEGATIVE 12/04/2020 1343   KETONESUR NEGATIVE 12/04/2020 1343   PROTEINUR NEGATIVE 12/04/2020 1343   NITRITE NEGATIVE 12/04/2020 1343  LEUKOCYTESUR NEGATIVE 12/04/2020 1343   Sepsis Labs Invalid input(s): PROCALCITONIN,  WBC,  LACTICIDVEN Microbiology Recent Results (from the past 240 hour(s))  SARS CORONAVIRUS 2 (TAT 6-24 HRS) Nasopharyngeal Nasopharyngeal Swab     Status: None   Collection Time: 12/04/20  9:26 PM   Specimen: Nasopharyngeal Swab  Result Value Ref Range Status   SARS Coronavirus 2 NEGATIVE NEGATIVE Final    Comment: (NOTE) SARS-CoV-2 target nucleic acids are NOT DETECTED.  The SARS-CoV-2 RNA is generally detectable in upper and lower respiratory specimens during the acute phase of infection. Negative results do not preclude SARS-CoV-2 infection, do not rule out co-infections with other pathogens, and should not be used as the sole basis for treatment or other patient management decisions. Negative results must be combined with clinical observations, patient history, and epidemiological information. The expected result is Negative.  Fact Sheet for Patients: SugarRoll.be  Fact Sheet for Healthcare Providers: https://www.woods-mathews.com/  This test is not yet approved or cleared by the [place] FDA and  has been authorized for detection and/or diagnosis of SARS-CoV-2 by FDA under an Emergency Use Authorization (EUA). This EUA will remain  in  effect (meaning this test can be used) for the duration of the COVID-19 declaration under Se ction 564(b)(1) of the Act, 21 U.S.C. section 360bbb-3(b)(1), unless the authorization is terminated or revoked sooner.  Performed at [facility], [place] [address] [place], [place] [address]    Time spent: 27mn  SIGNED:   SMarylu Lund MD  [facility] 12/05/2020, 3:03 PM  If 7PM-7AM, please contact night-coverage

## 2020-12-05 NOTE — Progress Notes (Signed)
  Echocardiogram 2D Echocardiogram has been performed.  Peter Brooks 12/05/2020, 12:00 PM

## 2020-12-05 NOTE — Care Management CC44 (Signed)
Condition Code 44 Documentation Completed  Patient Details  Name: Peter Brooks MRN: [id number] Date of Birth: 04/27/1946   Condition Code 44 given:  Yes Patient signature on Condition Code 44 notice:  Yes Documentation of 2 MD's agreement:  Yes Code 44 added to claim:  Yes    Dawayne Patricia, RN 12/05/2020, 3:59 PM

## 2020-12-05 NOTE — Care Management Obs Status (Signed)
[facility] NOTIFICATION   Patient Details  Name: Peter Brooks MRN: [id number] Date of Birth: Dec 22, 1945   Medicare Observation Status Notification Given:  Yes    Dawayne Patricia, RN 12/05/2020, 3:59 PM

## 2020-12-05 NOTE — Progress Notes (Signed)
Discharge instructions provided to patient. Follow-up appointments reviewed. All questions answered. IV removed. Patient to be escorted home by his family.   Gailen Shelter RN

## 2020-12-05 NOTE — Consult Note (Addendum)
Cardiology Consultation:   Patient ID: Peter Brooks MRN: [id number]; DOB: July 21, 1945  Admit date: 12/04/2020 Date of Consult: 12/05/2020  PCP:  Street, Peter Mt, MD   [place] [facility] Cardiologist:  Lauree Chandler, MD     Patient Profile:   Peter Brooks is a 75 y.o. male with a hx of CAD, hypertension, hyperlipidemia, SVT, DM2, and hypothyroidism who is being seen 12/05/2020 for the evaluation of possible syncope at the request of Dr. Wyline Copas.  History of Present Illness:   [name] is a pleasant 75 year old male with past medical history of CAD, hypertension, hyperlipidemia, SVT, DM2, and hypothyroidism.  He previously underwent DES to RPDA in October 2019.  Myoview in August 2021 was low risk without ischemia.  He underwent DES to OM1 in September 2021.  Last echocardiogram obtained on 10/12/2019 showed EF 60 to 65%, grade 1 DD, RVSP 31.1 mmHg, no significant valve issue.  More recently, he started complaining of dyspnea on exertion that is similar to his previous anginal equivalent, therefore he was set up for outpatient cardiac catheterization on 09/06/2020, this revealed 50 to 70% mid LAD lesion, 50% diagonal lesion, patent OM and PDA stent, LAD lesion is unchanged compared to the previous Film, medical therapy was recommended.  Due to occasional palpitation, a 2-week heart monitor was recommended.  This was done in May that revealed 19 bursts of SVT, longest episode lasting 2 minutes and 22 seconds, questionable junctional rhythm, 3.2% PVCs.  Most of the patient triggered event correlated with PVCs.  His low-dose Norvasc was discontinued and the Toprol-XL increased to 150 mg daily.  Due to elevation of the blood pressure, Norvasc was later resumed.  According to [employer] last note, if patient continues to have a lightheadedness and rapid palpitation, may consider increase metoprolol succinate to 100 mg twice a day.  Patient was in his usual state of health  until Sunday 12/04/2020 when he had a possible syncope episode.  He says he was out in the yard pushing a lawnmower for several hours on Saturday.  He woke up on Sunday feeling fine.  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  It is unknown how long he was out.  His wife and daughter came back into his office find him eyes wide open, lips pale, and was not responding.  He subsequently regained control of his body.  No blood pressure or heart rate was obtained.  He managed to walk to his car then came to [facility] for further evaluation.  Since he has been in the emergency room, his blood pressure has been elevated.  Orthostatic vital sign was negative.  ED physician has noted episodes of nonsustained A. fib on the monitor, however I do not have access to the telemetry that showed no sustained A. fib.  Urinalysis negative.  Troponin normal.  Chest x-ray shows no acute finding.  Electrolyte normal.  BNP borderline at 147.5.  EKG showed sinus rhythm, right bundle branch block, QTC 420 ms.  Hemoglobin A1c 7.8.  Cardiology has been consulted for possible syncope.  Talking with the patient, he was asymptomatic prior to the event.  He denies any flushing sensation, chest pain or worsening shortness of breath preceding to the event.   Past Medical History:  Diagnosis Date   Anxiety    Arthritis    CAD (coronary artery disease)    LHC 02/18/2020: prior stent to  RPDA patent, 90% stenosis of OM1 s/p PTCA/DES, 50% 1st Diag, 50% proximal to mid LAD, 20% distal LAD, 30% ostial to proximal CX, 30% OM2.   Cataract    removed bilaterally    Chest pain    Diabetes mellitus without complication ([place])    Dizziness    GERD (gastroesophageal reflux disease)    Hyperlipidemia    Hypertension    Hypothyroidism    Lumbar herniated disc     Past Surgical History:  Procedure Laterality Date   CARDIAC CATHETERIZATION  11/11/2008   CATARACT EXTRACTION  Right    CATARACT EXTRACTION Left    COLONOSCOPY     CORONARY STENT INTERVENTION N/A 03/11/2018   Procedure: CORONARY STENT INTERVENTION;  Surgeon: Martinique, Peter M, MD;  Location: [place] [facility];  Service: Cardiovascular;  Laterality: N/A;   CORONARY STENT INTERVENTION N/A 02/18/2020   Procedure: CORONARY STENT INTERVENTION;  Surgeon: Burnell Blanks, MD;  Location: [place] [facility];  Service: Cardiovascular;  Laterality: N/A;   KNEE ARTHROSCOPY Bilateral 2010 Right   POLYPECTOMY     RIGHT/LEFT HEART CATH AND CORONARY ANGIOGRAPHY N/A 03/11/2018   Procedure: RIGHT/LEFT HEART CATH AND CORONARY ANGIOGRAPHY;  Surgeon: Martinique, Peter M, MD;  Location: [place] [facility];  Service: Cardiovascular;  Laterality: N/A;   RIGHT/LEFT HEART CATH AND CORONARY ANGIOGRAPHY N/A 02/18/2020   Procedure: RIGHT/LEFT HEART CATH AND CORONARY ANGIOGRAPHY;  Surgeon: Burnell Blanks, MD;  Location: [place] [facility];  Service: Cardiovascular;  Laterality: N/A;   RIGHT/LEFT HEART CATH AND CORONARY ANGIOGRAPHY N/A 09/06/2020   Procedure: RIGHT/LEFT HEART CATH AND CORONARY ANGIOGRAPHY;  Surgeon: Belva Crome, MD;  Location: [place] [facility];  Service: Cardiovascular;  Laterality: N/A;     Home Medications:  Prior to Admission medications   Medication Sig Start Date End Date Taking? Authorizing Provider  amLODipine (NORVASC) 5 MG tablet Take 5 mg by mouth daily. 11/04/20  Yes [provider]  Ascorbic Acid (VITAMIN C) 1000 MG tablet Take 1,000 mg by mouth daily.   Yes [provider]  aspirin EC 81 MG tablet Take 81 mg by mouth every evening.    Yes [provider]  Calcium Carbonate-Vitamin D 600-400 MG-UNIT tablet Take 1 tablet by mouth daily.   Yes [provider]  clopidogrel (PLAVIX) 75 MG tablet Take 1 tablet (75 mg total) by mouth daily. 11/15/20  Yes Burnell Blanks, MD  Coenzyme Q10 (COQ10) 200 MG CAPS Take 200 mg by mouth daily.   Yes [provider]  diazepam (VALIUM) 2 MG tablet Take 1 tablet (2 mg total) by mouth every 12 (twelve) hours as needed for anxiety. 07/11/15  Yes Burnell Blanks, MD  docusate sodium (COLACE) 100 MG capsule Take 100 mg by mouth daily as needed for mild constipation.   Yes [provider]  furosemide (LASIX) 40 MG tablet Take 1 tablet (40 mg total) by mouth 2 (two) times daily. 03/17/20  Yes Burnell Blanks, MD  levothyroxine (SYNTHROID, LEVOTHROID) 50 MCG tablet Take 50 mcg by mouth daily.   Yes [provider]  metFORMIN (GLUCOPHAGE) 500 MG tablet Take 500 mg by mouth 2 (two) times daily.   Yes [provider]  metoprolol succinate (TOPROL XL) 100 MG 24 hr tablet Take 1.5 tablets (150 mg total) by mouth daily. 10/14/20  Yes Weaver, Scott T, PA-C  Multiple Vitamin (MULITIVITAMIN WITH MINERALS) TABS Take 1 tablet by mouth daily.   Yes [provider]  Multiple Vitamins-Minerals (PRESERVISION AREDS 2) CAPS Take 1 capsule by mouth 2 (two) times daily.    Yes [provider]  nitroGLYCERIN (NITROSTAT) 0.4 MG SL tablet Place 1 tablet (0.4 mg total) under the tongue every 5 (five) minutes as needed. 02/18/20  Yes Sande Rives E, PA-C  potassium chloride SA (KLOR-CON) 20 MEQ tablet Take 1 tablet (20 mEq total) by mouth 2 (two) times daily. 03/17/20  Yes Burnell Blanks, MD  Probiotic Product (PROBIOTIC DAILY PO) Take 1 tablet by mouth daily.   Yes [provider]  rosuvastatin (CRESTOR) 10 MG tablet Take 10 mg by mouth every evening. 08/16/20  Yes [provider]  Blood Glucose Monitoring Suppl (ONE TOUCH ULTRA 2) w/Device KIT See admin instructions. 12/08/18   [provider]  Donald Siva test strip USE 1 STRIP TO [place] DAILY 12/08/18   [provider]    Inpatient Medications: Scheduled Meds:  vitamin C  1,000 mg Oral Daily   aspirin EC  81 mg Oral QPM   clopidogrel  75 mg Oral Daily    enoxaparin (LOVENOX) injection  40 mg Subcutaneous QHS   furosemide  40 mg Oral BID   levothyroxine  50 mcg Oral Daily   metFORMIN  500 mg Oral BID   metoprolol succinate  150 mg Oral Daily   multivitamin  1 tablet Oral BID   multivitamin with minerals  1 tablet Oral Daily   potassium chloride SA  20 mEq Oral BID   rosuvastatin  10 mg Oral QPM   sodium chloride flush  3 mL Intravenous Q12H   Continuous Infusions:  PRN Meds: diazepam, docusate sodium, nitroGLYCERIN  Allergies:    Allergies  Allergen Reactions   Cefdinir Other (See Comments)    Swelling of the throat    Social History:   Social History   Socioeconomic History   Marital status: Married    Spouse name: Not on file   Number of children: Not on file   Years of education: Not on file   Highest education level: Not on file  Occupational History   Not on file  Tobacco Use   Smoking status: Never   Smokeless tobacco: Never  Vaping Use   Vaping Use: Never used  Substance and Sexual Activity   Alcohol use: No   Drug use: No   Sexual activity: Yes    Comment: married  Other Topics Concern   Not on file  Social History Narrative   Not on file   Social Determinants of Health   Financial Resource Strain: Not on file  Food Insecurity: Not on file  Transportation Needs: Not on file  Physical Activity: Not on file  Stress: Not on file  Social Connections: Not on file  Intimate Partner Violence: Not on file    Family History:    Family History  Problem Relation Age of Onset   Colon cancer Paternal Uncle 52   Coronary artery disease Father        with CABG valve replacement in his 75's   Heart attack Mother        in her 28's   Stomach cancer Paternal Grandmother    Esophageal cancer Neg Hx    Rectal cancer Neg Hx    Colon polyps Neg Hx      ROS:  Please see the history of present illness.   All other ROS reviewed and negative.     Physical Exam/Data:   Vitals:  12/05/20 1133 12/05/20  1137 12/05/20 1139 12/05/20 1144  BP: (!) 152/86 (!) 160/91 (!) 160/92 (!) 154/88  Pulse: 68 68    Resp: '18 20 20 20  ' Temp: 98.1 F (36.7 C)     TempSrc: Oral     SpO2: 95% 96%    Weight:      Height:        Intake/Output Summary (Last 24 hours) at 12/05/2020 1339 Last data filed at 12/05/2020 1152 Gross per 24 hour  Intake 240 ml  Output 600 ml  Net -360 ml   Last 3 Weights 12/05/2020 09/20/2020 09/06/2020  Weight (lbs) 270 lb 14.4 oz 264 lb 3.2 oz 258 lb  Weight (kg) 122.879 kg 119.84 kg 117.028 kg     Body mass index is 34.78 kg/m.  General:  Well nourished, well developed, in no acute distress HEENT: normal Lymph: no adenopathy Neck: no JVD Endocrine:  No thryomegaly Vascular: No carotid bruits; FA pulses 2+ bilaterally without bruits  Cardiac:  normal S1, S2; RRR; 1/6 murmur at RUSB  Lungs:  clear to auscultation bilaterally, no wheezing, rhonchi or rales  Abd: soft, nontender, no hepatomegaly  Ext: no edema Musculoskeletal:  No deformities, BUE and BLE strength normal and equal Skin: warm and dry  Neuro:  CNs 2-12 intact, no focal abnormalities noted Psych:  Normal affect   EKG:  The EKG was personally reviewed and demonstrates:  NSR with RBBB, normal QTc Telemetry:  Telemetry was personally reviewed and demonstrates: NSR without significant ventricular ectopy  Relevant CV Studies:  Echo 10/12/2019  1. Left ventricular ejection fraction, by estimation, is 60 to 65%. The  left ventricle has normal function. The left ventricle has no regional  wall motion abnormalities. There is mild concentric left ventricular  hypertrophy. Left ventricular diastolic  parameters are consistent with Grade I diastolic dysfunction (impaired  relaxation).   2. Right ventricular systolic function is normal. The right ventricular  size is normal. There is normal pulmonary artery systolic pressure. The  estimated right ventricular systolic pressure is 91.4 mmHg.   3. The mitral valve is  normal in structure. No evidence of mitral valve  regurgitation. No evidence of mitral stenosis.   4. The aortic valve is tricuspid. Aortic valve regurgitation is not  visualized. Mild to moderate aortic valve sclerosis/calcification is  present, without any evidence of aortic stenosis.   5. The inferior vena cava is normal in size with greater than 50%  respiratory variability, suggesting right atrial pressure of 3 mmHg.    Left and right heart cath 09/06/2020 Right dominant with diffuse mild luminal irregularities.  Patent stent proximal PDA.  PDA is small. Widely patent left main. LAD reaches the left ventricular apex.  The mid vessel contains a 50 to 70% stenosis and forms a [place] 011 bifurcation stenosis with the diagonal.  Diagonal contains 50% stenosis.  The LAD is unchanged compared to prior imaging in 2021. Circumflex has moderate diffuse disease.  The stent in the small first obtuse marginal is widely patent. Normal LV function.  LVEDP 12 mmHg.  EF 55%. Normal right heart pressures.   RECOMMENDATIONS:   No significant change in anatomy compared to September 2021. Patent obtuse marginal and PDA stents. Moderate mid LAD bifurcation stenosis, unchanged compared to prior. Okay to pause antiplatelet therapy for colonoscopy Consider phase 2 cardiac rehab to further assess the patient's exertional fatigue.  Consider sleep study.  Consider arrhythmia.   Laboratory Data:  High Sensitivity Troponin:   Recent  Labs  Lab 12/04/20 1343 12/04/20 2104  TROPONINIHS 5 4     Chemistry Recent Labs  Lab 12/04/20 1233 12/04/20 2104  NA 138  --   K 4.3  --   CL 104  --   CO2 25  --   GLUCOSE 132*  --   BUN 13  --   CREATININE 1.10 1.07  CALCIUM 9.8  --   GFRNONAA >60 >60  ANIONGAP 9  --     No results for input(s): PROT, ALBUMIN, AST, ALT, ALKPHOS, BILITOT in the last 168 hours. Hematology Recent Labs  Lab 12/04/20 1233 12/04/20 2104  WBC 9.0 7.2  RBC 4.82 4.53  HGB 14.4  13.4  HCT 43.9 41.3  MCV 91.1 91.2  MCH 29.9 29.6  MCHC 32.8 32.4  RDW 13.3 13.5  PLT 267 237   BNP Recent Labs  Lab 12/04/20 1233  BNP 147.5*    DDimer No results for input(s): DDIMER in the last 168 hours.   Radiology/Studies:  X-ray chest PA and lateral  Result Date: 12/04/2020 CLINICAL DATA:  Syncope, collapse EXAM: CHEST - 2 VIEW COMPARISON:  12/04/2020 at 1337 hours FINDINGS: Mild patchy left lower lobe opacity, atelectasis versus pneumonia. Mild right basilar opacity, likely atelectasis. No pleural effusion or pneumothorax. The heart is normal in size. Degenerative changes of the thoracic spine. IMPRESSION: Mild patchy left lower lobe opacity, atelectasis versus pneumonia. Electronically Signed   By: Julian Hy M.D.   On: 12/04/2020 21:08   CT Head Wo Contrast  Result Date: 12/04/2020 CLINICAL DATA:  Syncope, history of atrial fibrillation and coronary artery disease EXAM: CT HEAD WITHOUT CONTRAST TECHNIQUE: Contiguous axial images were obtained from the base of the skull through the vertex without intravenous contrast. COMPARISON:  08/08/2011 FINDINGS: Brain: No acute infarct or hemorrhage. Lateral ventricles and midline structures are unremarkable. No acute extra-axial fluid collections. No mass effect. Vascular: No hyperdense vessel or unexpected calcification. Skull: Normal. Negative for fracture or focal lesion. Sinuses/Orbits: No acute finding. Other: None. IMPRESSION: 1. No acute intracranial process. Electronically Signed   By: Randa Ngo M.D.   On: 12/04/2020 16:12   DG Chest Port 1 View  Result Date: 12/04/2020 CLINICAL DATA:  Pt experienced LOC earlier today while working at his desk. Family member found him slumped over desk with no memory of feeling faint. Unsure how long he was unconscious. Now states he just feels weak. Hx of stents, tachycardia EXAM: PORTABLE CHEST - 1 VIEW COMPARISON:  08/08/2011 FINDINGS: Lungs are clear. Heart size and mediastinal  contours are within normal limits. No effusion.  No pneumothorax. Visualized bones unremarkable. IMPRESSION: No acute cardiopulmonary disease. Electronically Signed   By: Lucrezia Europe M.D.   On: 12/04/2020 14:03     Assessment and Plan:   Possible syncope:  -Wife and daughter from the patient slumped in the chair yesterday with his eyes wide open and lips pale.  Patient had no prodromal symptom and in no obvious symptom afterward.  He was able to walk to his car and come to the [facility] ED. -Patient had recently had extensive work-up include cardiac catheterization and a 2-week heart monitor that showed PVCs, rare bigeminy and trigeminy and 19 bursts of SVT, longest episode was 2 minutes.  His Toprol-XL was increased to 150 mg daily. -Symptom occurred while sitting down.  He says he was out mowing the yard for several hours the day before.  Therefore dehydration is a possibility.  Since being admitted, I  have not seen any significant irregular rhythm or prolonged pauses on the telemetry to explain his symptoms. -Orthostatic vital sign was negative. -Pending echocardiogram.  If normal, I likely would not recommend any further work-up.  Unless symptom occurs again, patient likely does not need a loop recorder.  CAD: Coronary anatomy stable on recent cardiac catheterization in April 2022.  Continue aspirin and Plavix.  Hypertension: Blood pressure is high in the emergency room, no sign of low blood pressure to explain his symptoms.  Hyperlipidemia: On Crestor  History of SVT: Obtain TSH.  19 episodes on recent 2-week heart monitor, longest was 2-minute.  Metoprolol succinate was increased to 150 mg daily in May as result of the heart monitor report.  DM2  Hypothyroidism: On levothyroxine.   Risk Assessment/Risk Scores:      None   For questions or updates, please contact [place] Please consult [email or web address] for contact info under    Signed, Almyra Deforest, [place]  12/05/2020 1:39  PM  ---------------------------------------------------------------------------------------------   History and all data above reviewed.  Patient examined.  I agree with the findings as above.  Peter Brooks is a 75 year old male presenting with possible syncope with a history of CAD with recent stable coronary angiogram, hypertension, hyperlipidemia, SVT and PVCs, diabetes and hypothyroidism.  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  He is on sure if he lost consciousness but his wife and daughter saw him slumped over in the chair with pale lips.  He does not recall preceding palpitations or chest pain.  He worked in the yard for a long time on Saturday and felt he may have been dehydrated.  He has recently had ischemic evaluation with stable moderate coronary artery disease and patent prior stents as noted above.  He also recently underwent cardiac monitor for palpitations which showed symptomatic PVCs and intermittent SVT which he did not document as symptomatic.  His metoprolol has recently been increased.  Emergency department notes suggest intermittent atrial fibrillation, however on our independent review of the telemetry we are unable to see any episodes representative of atrial fibrillation.  He has no known history of A. fib.  Echocardiogram performed today.  Formal interpretation pending however on my independent review there is no significant valvular heart disease, LVEF is preserved at approximately 55 to 60% with a inferolateral regional wall motion abnormality which appears unchanged from the prior echocardiogram 10/12/2019.  IVC is small and easily collapsible with an estimated RA pressure of approximately 3 mmHg.  He is seen at the bedside with his wife and daughter who provide additional history.  Constitutional: No acute distress Eyes: pupils equally round and reactive to light, sclera non-icteric, normal conjunctiva and  lids ENMT: normal dentition, moist mucous membranes Cardiovascular: regular rhythm, normal rate, no murmurs. S1 and S2 normal. Radial pulses normal bilaterally. No jugular venous distention.  Respiratory: clear to auscultation bilaterally GI : normal bowel sounds, soft and nontender. No distention.   MSK: extremities warm, well perfused. No edema.  NEURO: grossly nonfocal exam, moves all extremities. PSYCH: alert and oriented x 3, normal mood and affect.   ECG: Sinus bradycardia with right bundle branch block, essentially unchanged from ECG 09/20/2020 aside from very mild prolongation of QRS duration.  All available labs, radiology testing, previous records reviewed. Agree with documented assessment and plan of my colleague as stated above with the following additions or changes:  Principal Problem:   Syncope Active Problems:  Essential hypertension   CAD (coronary artery disease)    Plan: Patient most likely experienced neurocardiogenic syncope from dehydration if in fact he had syncope.  I have recommended oral hydration over the next several days since he appears back to baseline and is feeling well.  He has a longstanding history of snoring and his wife states that she often hears him have apneic episodes as well.  He has not yet been screened for sleep apnea and I recommended that he address this with Dr. Angelena Form his primary cardiologist.  This may help with burden of palpitations and overall cardiovascular health.  He inquires what to do when he has episodes of symptomatic palpitations.  His metoprolol has recently been increased, however could consider low-dose metoprolol tartrate for days when he is particularly symptomatic and episodes are prolonged.  He will review this with his primary cardiologist.  If he has recurrent symptoms would recommend event monitor or loop recorder for extended cardiac monitoring, however at this time there is no significant concern for arrhythmia as the  source of his possible syncope.  Follow-up is already arranged in cardiology, he is stable for hospital discharge from a cardiac perspective.  Length of Stay:  LOS: 1 day   Elouise Munroe, MD [facility] 2:58 PM  12/05/2020

## 2020-12-06 LAB — ECHOCARDIOGRAM COMPLETE
Area-P 1/2: 2.42 cm2
Height: 74 in
S' Lateral: 3.2 cm
Weight: 4334.4 oz

## 2020-12-13 DIAGNOSIS — Z7984 Long term (current) use of oral hypoglycemic drugs: Secondary | ICD-10-CM | POA: Diagnosis not present

## 2020-12-13 DIAGNOSIS — Z0000 Encounter for general adult medical examination without abnormal findings: Secondary | ICD-10-CM | POA: Diagnosis not present

## 2020-12-13 DIAGNOSIS — F411 Generalized anxiety disorder: Secondary | ICD-10-CM | POA: Diagnosis not present

## 2020-12-13 DIAGNOSIS — K2960 Other gastritis without bleeding: Secondary | ICD-10-CM | POA: Diagnosis not present

## 2020-12-13 DIAGNOSIS — E1169 Type 2 diabetes mellitus with other specified complication: Secondary | ICD-10-CM | POA: Diagnosis not present

## 2020-12-13 DIAGNOSIS — E039 Hypothyroidism, unspecified: Secondary | ICD-10-CM | POA: Diagnosis not present

## 2020-12-13 DIAGNOSIS — Z1331 Encounter for screening for depression: Secondary | ICD-10-CM | POA: Diagnosis not present

## 2020-12-13 DIAGNOSIS — I25119 Atherosclerotic heart disease of native coronary artery with unspecified angina pectoris: Secondary | ICD-10-CM | POA: Diagnosis not present

## 2020-12-13 DIAGNOSIS — Z6836 Body mass index (BMI) 36.0-36.9, adult: Secondary | ICD-10-CM | POA: Diagnosis not present

## 2020-12-13 DIAGNOSIS — E785 Hyperlipidemia, unspecified: Secondary | ICD-10-CM | POA: Diagnosis not present

## 2020-12-13 DIAGNOSIS — I5189 Other ill-defined heart diseases: Secondary | ICD-10-CM | POA: Diagnosis not present

## 2021-01-12 ENCOUNTER — Ambulatory Visit: Payer: PPO | Admitting: Cardiovascular Disease

## 2021-01-12 ENCOUNTER — Other Ambulatory Visit: Payer: Self-pay

## 2021-01-12 ENCOUNTER — Encounter: Payer: Self-pay | Admitting: Cardiovascular Disease

## 2021-01-12 VITALS — BP 124/70 | HR 62 | Ht 74.0 in | Wt 269.0 lb

## 2021-01-12 DIAGNOSIS — E782 Mixed hyperlipidemia: Secondary | ICD-10-CM

## 2021-01-12 DIAGNOSIS — I471 Supraventricular tachycardia: Secondary | ICD-10-CM

## 2021-01-12 DIAGNOSIS — I5032 Chronic diastolic (congestive) heart failure: Secondary | ICD-10-CM

## 2021-01-12 DIAGNOSIS — I10 Essential (primary) hypertension: Secondary | ICD-10-CM | POA: Diagnosis not present

## 2021-01-12 DIAGNOSIS — I2510 Atherosclerotic heart disease of native coronary artery without angina pectoris: Secondary | ICD-10-CM | POA: Diagnosis not present

## 2021-01-12 NOTE — Progress Notes (Signed)
Chief Complaint  Patient presents with   Follow-up    CAD    History of Present Illness: 75 yo male with history of CAD, chronic diastolic CHF, HTN, HLD, GERD and SVT here today for cardiac follow up.  Cardiac cath June 2010 with non-obstructive CAD and LVEF 60%.  He was seen in January 2015 and had c/o heart racing, dizziness, fatigue. Stress myoview 07/27/13 without ischemia. Echo 07/27/13 with normal LV function, no significant valve issues. Event monitor in 2015 without any arrythmias. He called our office in July 2019 with c/o palpitations. Cardiac monitor August 2019 with PVCs, PACs, short run of SVT and 4 beat run of non-sustained VT. Toprol was started. I saw him in the office in October 2019 and he c/o progressive dyspnea and chest pain with exertion. Echo October 2019 with normal LV systolic function. No significant valve disease. Cardiac cath 03/11/18 with severe stenosis in the right PDA treated with a drug eluting stent. Mild non-obstructive disease in the LAD and Circumflex. He was seen via telemedicine visit 01/19/19 and had c/o chest pain with exertion. Nuclear stress test 02/20/19 with no evidence of ischemia. Cardiac monitor September 2020 with sinus, several short runs of SVT. I saw him in May 2021 and he reported increased dyspnea and LE edema. Lasix was started. Echo 10/12/19 with LVEF=60-65%, no valve disease. Nuclear stress test August 2021 with no ischemia. He continued to have chest pain and dyspnea so we arranged a cardiac cath on 02/18/20. This showed a patent right PDA stent, non-obstructive LAD disease and a severe stenosis in the first obtuse marginal branch which was treated with a drug eluting stent. Right heart cath with low filling pressures, PCWP 7, LVEDP 8. Repeat cardiac cath April 2022 with stable CAD. He was seen by Richardson Dopp, PA-C in May 2022 and c/o ongoing weakness and dizziness. Norvasc was lowered. 14 day cardiac monitor with sinus with 19 runs of SVT, longest 2  minutes and 22 seconds. PVCs. He was admitted to [facility] in July 2022 after a possible syncopal episode while sitting at home. He was seen by Cardiology. Echo with LVEF=60-65%, no significant valve disease.   He is here today for follow up. The patient denies any chest pain, dyspnea, lower extremity edema, orthopnea, PND, dizziness, near syncope or syncope. Overall feeling well. Rare palpitations.   Primary Care Physician: Street, Sharon Mt, MD  Past Medical History:  Diagnosis Date   Anxiety    Arthritis    CAD (coronary artery disease)    LHC 02/18/2020: prior stent to RPDA patent, 90% stenosis of OM1 s/p PTCA/DES, 50% 1st Diag, 50% proximal to mid LAD, 20% distal LAD, 30% ostial to proximal CX, 30% OM2.   Cataract    removed bilaterally    Chest pain    Diabetes mellitus without complication ([place])    Dizziness    GERD (gastroesophageal reflux disease)    Hyperlipidemia    Hypertension    Hypothyroidism    Lumbar herniated disc     Past Surgical History:  Procedure Laterality Date   CARDIAC CATHETERIZATION  11/11/2008   CATARACT EXTRACTION Right    CATARACT EXTRACTION Left    COLONOSCOPY     CORONARY STENT INTERVENTION N/A 03/11/2018   Procedure: CORONARY STENT INTERVENTION;  Surgeon: Martinique, Peter M, MD;  Location: [place] [facility];  Service: Cardiovascular;  Laterality: N/A;   CORONARY STENT INTERVENTION N/A 02/18/2020   Procedure: CORONARY STENT INTERVENTION;  Surgeon: Burnell Blanks, MD;  Location: [place] [facility];  Service: Cardiovascular;  Laterality: N/A;   KNEE ARTHROSCOPY Bilateral 2010 Right   POLYPECTOMY     RIGHT/LEFT HEART CATH AND CORONARY ANGIOGRAPHY N/A 03/11/2018   Procedure: RIGHT/LEFT HEART CATH AND CORONARY ANGIOGRAPHY;  Surgeon: Martinique, Peter M, MD;  Location: [place] [facility];  Service: Cardiovascular;  Laterality: N/A;   RIGHT/LEFT HEART CATH AND CORONARY ANGIOGRAPHY N/A 02/18/2020   Procedure: RIGHT/LEFT HEART CATH AND CORONARY  ANGIOGRAPHY;  Surgeon: Burnell Blanks, MD;  Location: [place] [facility];  Service: Cardiovascular;  Laterality: N/A;   RIGHT/LEFT HEART CATH AND CORONARY ANGIOGRAPHY N/A 09/06/2020   Procedure: RIGHT/LEFT HEART CATH AND CORONARY ANGIOGRAPHY;  Surgeon: Belva Crome, MD;  Location: [place] [facility];  Service: Cardiovascular;  Laterality: N/A;    Current Outpatient Medications  Medication Sig Dispense Refill   amLODipine (NORVASC) 5 MG tablet Take 5 mg by mouth daily.     Ascorbic Acid (VITAMIN C) 1000 MG tablet Take 1,000 mg by mouth daily.     aspirin EC 81 MG tablet Take 81 mg by mouth every evening.      Blood Glucose Monitoring Suppl (ONE TOUCH ULTRA 2) w/Device KIT See admin instructions.     Calcium Carbonate-Vitamin D 600-400 MG-UNIT tablet Take 1 tablet by mouth daily.     clopidogrel (PLAVIX) 75 MG tablet Take 1 tablet (75 mg total) by mouth daily. 90 tablet 3   Coenzyme Q10 (COQ10) 200 MG CAPS Take 200 mg by mouth daily.     diazepam (VALIUM) 2 MG tablet Take 1 tablet (2 mg total) by mouth every 12 (twelve) hours as needed for anxiety. 30 tablet 1   docusate sodium (COLACE) 100 MG capsule Take 100 mg by mouth daily as needed for mild constipation.     furosemide (LASIX) 40 MG tablet Take 1 tablet (40 mg total) by mouth 2 (two) times daily. 180 tablet 3   levothyroxine (SYNTHROID, LEVOTHROID) 50 MCG tablet Take 50 mcg by mouth daily.     metFORMIN (GLUCOPHAGE) 500 MG tablet Take 500 mg by mouth 2 (two) times daily.     metoprolol succinate (TOPROL XL) 100 MG 24 hr tablet Take 1.5 tablets (150 mg total) by mouth daily. 180 tablet 3   Multiple Vitamin (MULITIVITAMIN WITH MINERALS) TABS Take 1 tablet by mouth daily.     Multiple Vitamins-Minerals (PRESERVISION AREDS 2) CAPS Take 1 capsule by mouth 2 (two) times daily.      nitroGLYCERIN (NITROSTAT) 0.4 MG SL tablet Place 1 tablet (0.4 mg total) under the tongue every 5 (five) minutes as needed. 25 tablet 3   ONETOUCH ULTRA  test strip USE 1 STRIP TO CHECK GLUCOSE ONCE DAILY     potassium chloride SA (KLOR-CON) 20 MEQ tablet Take 1 tablet (20 mEq total) by mouth 2 (two) times daily. 180 tablet 3   Probiotic Product (PROBIOTIC DAILY PO) Take 1 tablet by mouth daily.     rosuvastatin (CRESTOR) 10 MG tablet Take 10 mg by mouth every evening.     No current facility-administered medications for this visit.    Allergies  Allergen Reactions   Cefdinir Other (See Comments)    Swelling of the throat    Social History   Socioeconomic History   Marital status: Married    Spouse name: Not on file   Number of children: Not on file   Years of education: Not on file   Highest education level: Not on file  Occupational  History   Not on file  Tobacco Use   Smoking status: Never   Smokeless tobacco: Never  Vaping Use   Vaping Use: Never used  Substance and Sexual Activity   Alcohol use: No   Drug use: No   Sexual activity: Yes    Comment: married  Other Topics Concern   Not on file  Social History Narrative   Not on file   Social Determinants of Health   Financial Resource Strain: Not on file  Food Insecurity: Not on file  Transportation Needs: Not on file  Physical Activity: Not on file  Stress: Not on file  Social Connections: Not on file  Intimate Partner Violence: Not on file    Family History  Problem Relation Age of Onset   Colon cancer Paternal Uncle 39   Coronary artery disease Father        with CABG valve replacement in his 85's   Heart attack Mother        in her 81's   Stomach cancer Paternal Grandmother    Esophageal cancer Neg Hx    Rectal cancer Neg Hx    Colon polyps Neg Hx     Review of Systems:  As stated in the HPI and otherwise negative.   BP 124/70   Pulse 62   Ht _0  (1.88 m)   Wt 269 lb (122 kg)   SpO2 96%   BMI 34.54 kg/m   Physical Examination:  General: Well developed, well nourished, NAD  HEENT: OP clear, mucus membranes moist  SKIN: warm, dry. No  rashes. Neuro: No focal deficits  Musculoskeletal: Muscle strength 5/5 all ext  Psychiatric: Mood and affect normal  Neck: No JVD, no carotid bruits, no thyromegaly, no lymphadenopathy.  Lungs:Clear bilaterally, no wheezes, rhonci, crackles Cardiovascular: Regular rate and rhythm. No murmurs, gallops or rubs. Abdomen:Soft. Bowel sounds present. Non-tender.  Extremities: No lower extremity edema. Pulses are 2 + in the bilateral DP/PT.  Echo July 2022:  1. Left ventricular ejection fraction, by estimation, is 60 to 65%. The  left ventricle has normal function. The left ventricle has no regional  wall motion abnormalities. Left ventricular diastolic parameters are  consistent with Grade I diastolic  dysfunction (impaired relaxation).   2. Right ventricular systolic function is normal. The right ventricular  size is normal. There is mildly elevated pulmonary artery systolic  pressure.   3. Left atrial size was mildly dilated.   4. The mitral valve is normal in structure. No evidence of mitral valve  regurgitation. No evidence of mitral stenosis.   5. The aortic valve is normal in structure. There is mild calcification  of the aortic valve. Aortic valve regurgitation is not visualized. Mild  aortic valve sclerosis is present, with no evidence of aortic valve  stenosis.   6. The inferior vena cava is normal in size with greater than 50%  respiratory variability, suggesting right atrial pressure of 3 mmHg.   EKG:  EKG is not ordered today. The ekg ordered today demonstrates   Recent Labs: 06/01/2020: ALT 15 08/23/2020: NT-Pro BNP 137 12/04/2020: B Natriuretic Peptide 147.5; BUN 13; Creatinine, Ser 1.07; Hemoglobin 13.4; Platelets 237; Potassium 4.3; Sodium 138   Lipid Panel Followed in primary care   Wt Readings from Last 3 Encounters:  01/12/21 269 lb (122 kg)  12/05/20 270 lb 14.4 oz (122.9 kg)  09/20/20 264 lb 3.2 oz (119.8 kg)     Other studies Reviewed: Additional studies/  records that were  reviewed today include: . Review of the above records demonstrates:    Assessment and Plan:   1. CAD with without angina: No chest pain. CAD stable by cardiac cath April 2022. Will continue ASA, statin and beta blocker.   He will stop Plavix  2. HTN: BP is controlled. No changes  3. HLD:  Lipids followed in primary care. LDL 52 in January 2022. Continue statin.    4. PVCs/PACs/SVT: He has rare palpitations. Continue Toprol  5. Chronic diastolic CHF: LV systolic function normal by echo in July 2022. No valve disease. Continue Lasix.   Current medicines are reviewed at length with the patient today.  The patient does not have concerns regarding medicines.  The following changes have been made:    Labs/ tests ordered today include:   No orders of the defined types were placed in this encounter.   Disposition:   Follow up me in 6 months.   Signed, Lauree Chandler, MD 01/12/2021 9:53 AM    [facility] [address], [place], [place]  [address] Phone: [phone]; Fax: [phone]

## 2021-01-12 NOTE — Patient Instructions (Addendum)
Medication Instructions:  Your physician has recommended you make the following change in your medication:   You may stop your Plavix once you finish the current bottle  *If you need a refill on your cardiac medications before your next appointment, please call your pharmacy*   Lab Work: None ordered  If you have labs (blood work) drawn today and your tests are completely normal, you will receive your results only by: [place] (if you have MyChart) OR A paper copy in the mail If you have any lab test that is abnormal or we need to change your treatment, we will call you to review the results.   Testing/Procedures: None ordered   Follow-Up: At [facility], you and your health needs are our priority.  As part of our continuing mission to provide you with exceptional heart care, we have created designated Provider Care Teams.  These Care Teams include your primary Cardiologist (physician) and Advanced Practice Providers (APPs -  Physician Assistants and Nurse Practitioners) who all work together to provide you with the care you need, when you need it.  We recommend signing up for the patient portal called "MyChart".  Sign up information is provided on this After Visit Summary.  MyChart is used to connect with patients for Virtual Visits (Telemedicine).  Patients are able to view lab/test results, encounter notes, upcoming appointments, etc.  Non-urgent messages can be sent to your provider as well.   To learn more about what you can do with MyChart, go to [email or web address].    Your next appointment:   6 month(s)  07/18/20 10:00  The format for your next appointment:   In Person  Provider:   You may see Lauree Chandler, MD or one of the following Advanced Practice Providers on your designated Care Team:   Melina Copa, PA-C Ermalinda Barrios, PA-C   Other Instructions

## 2021-02-06 ENCOUNTER — Ambulatory Visit: Payer: PPO | Admitting: Podiatry

## 2021-02-15 ENCOUNTER — Ambulatory Visit: Payer: PPO | Admitting: Podiatry

## 2021-02-17 DIAGNOSIS — J0110 Acute frontal sinusitis, unspecified: Secondary | ICD-10-CM | POA: Diagnosis not present

## 2021-02-17 DIAGNOSIS — Z20828 Contact with and (suspected) exposure to other viral communicable diseases: Secondary | ICD-10-CM | POA: Diagnosis not present

## 2021-02-21 ENCOUNTER — Ambulatory Visit: Payer: PPO | Admitting: Gastroenterology

## 2021-03-13 DIAGNOSIS — Z23 Encounter for immunization: Secondary | ICD-10-CM | POA: Diagnosis not present

## 2021-03-16 ENCOUNTER — Encounter: Payer: Self-pay | Admitting: Physician Assistant

## 2021-03-16 ENCOUNTER — Ambulatory Visit: Payer: PPO | Admitting: Physician Assistant

## 2021-03-16 VITALS — BP 110/72 | HR 62 | Ht 74.0 in | Wt 268.0 lb

## 2021-03-16 DIAGNOSIS — K219 Gastro-esophageal reflux disease without esophagitis: Secondary | ICD-10-CM | POA: Diagnosis not present

## 2021-03-16 DIAGNOSIS — Z86010 Personal history of colonic polyps: Secondary | ICD-10-CM | POA: Diagnosis not present

## 2021-03-16 NOTE — Patient Instructions (Addendum)
If you are age 77 or older, your body mass index should be between 23-30. Your Body mass index is 34.41 kg/m. If this is out of the aforementioned range listed, please consider follow up with your Primary Care Provider. ________________________________________________________  The [facility] providers would like to encourage you to use [facility] to communicate with providers for non-urgent requests or questions.  Due to long hold times on the telephone, sending your provider a message by [facility] may be a faster and more efficient way to get a response.  Please allow 48 business hours for a response.  Please remember that this is for non-urgent requests.  _______________________________________________________  Use over the counter Famotidine 20 mg 1 tablet after dinner for GERD.  Follow up with Dr. Ardis Hughs as needed.  Thank you for entrusting me with your care and choosing [facility].  Amy Esterwood, PA-C

## 2021-03-16 NOTE — Progress Notes (Signed)
Subjective:    Patient ID: Peter Brooks, male    DOB: 01/23/1946, 75 y.o.   MRN: [id number]  HPI Peter Brooks is a pleasant 75 year old white male, established with Dr. Ardis Hughs who comes in today to discuss recall colonoscopy.  He had recently been on Plavix and aspirin, however Plavix was stopped about a month ago. Patient has history of adenomatous colon polyps with colonoscopy in 2014 with a 1.1 cm sessile serrated adenoma and follow-up in October 2017 with removal of one 8 mm polyp which path proved to be a hyperplastic polyp.  Patient was told to have interval 5-year follow-up. He says that he is not having any GI issues currently, has not noted any changes in bowel habits, melena or hematochezia.  No complaints of abdominal pain.  He does mention that he is having some occasional nocturnal reflux symptoms, and is not on any therapy.  He says this is not happening on a daily basis but a couple of times a week recently. He relates that his wife had recently been in to see Dr. Ardis Hughs and had discussed not continuing surveillance colonoscopies due to advanced age.  He is asking for recommendations for himself today. Patient does have history of hypertension, coronary artery disease status post drug-eluting stent in September 2021.  Also with history of adult onset diabetes mellitus, nonsustained V. tach.  He had undergone cardiac catheterization in April 2020 to [facility] to complaints of dyspnea and was found to have stable disease with no changes.  50 to 70% LAD lesion and 50% diag. He then underwent monitoring and was found to be in atrial fibrillation which is now under control with medication. He had admission in July 2022 due to a syncopal episode.  He was evaluated by audiology during that admission, not felt to be cardiac induced but rather neurocardiogenic and likely related to relative dehydration.  He has not had any recurrent symptoms since.  He had been seen by Dr. Angelena Form in August 2022 at  which time the Plavix was stopped. Family history negative for first-degree relatives for with colon cancer, he did have an uncle with colon cancer.  Review of Systems  Pertinent positive and negative review of systems were noted in the above HPI section.  All other review of systems was otherwise negative.   Outpatient Encounter Medications as of 03/16/2021  Medication Sig   amLODipine (NORVASC) 5 MG tablet Take 5 mg by mouth daily.   Ascorbic Acid (VITAMIN C) 1000 MG tablet Take 1,000 mg by mouth daily.   aspirin EC 81 MG tablet Take 81 mg by mouth every evening.    Blood Glucose Monitoring Suppl (ONE TOUCH ULTRA 2) w/Device KIT See admin instructions.   Calcium Carbonate-Vitamin D 600-400 MG-UNIT tablet Take 1 tablet by mouth daily.   Coenzyme Q10 (COQ10) 200 MG CAPS Take 200 mg by mouth daily.   diazepam (VALIUM) 2 MG tablet Take 1 tablet (2 mg total) by mouth every 12 (twelve) hours as needed for anxiety.   docusate sodium (COLACE) 100 MG capsule Take 100 mg by mouth daily as needed for mild constipation.   furosemide (LASIX) 40 MG tablet Take 1 tablet (40 mg total) by mouth 2 (two) times daily.   levothyroxine (SYNTHROID, LEVOTHROID) 50 MCG tablet Take 50 mcg by mouth daily.   metFORMIN (GLUCOPHAGE) 500 MG tablet Take 500 mg by mouth 2 (two) times daily.   metoprolol succinate (TOPROL XL) 100 MG 24 hr tablet Take 1.5 tablets (150 mg  total) by mouth daily.   Multiple Vitamin (MULITIVITAMIN WITH MINERALS) TABS Take 1 tablet by mouth daily.   Multiple Vitamins-Minerals (PRESERVISION AREDS 2) CAPS Take 1 capsule by mouth 2 (two) times daily.    nitroGLYCERIN (NITROSTAT) 0.4 MG SL tablet Place 1 tablet (0.4 mg total) under the tongue every 5 (five) minutes as needed.   ONETOUCH ULTRA test strip USE 1 STRIP TO CHECK GLUCOSE ONCE DAILY   potassium chloride SA (KLOR-CON) 20 MEQ tablet Take 1 tablet (20 mEq total) by mouth 2 (two) times daily.   Probiotic Product (PROBIOTIC DAILY PO) Take 1  tablet by mouth daily.   rosuvastatin (CRESTOR) 10 MG tablet Take 10 mg by mouth every evening.   [DISCONTINUED] clopidogrel (PLAVIX) 75 MG tablet Take 1 tablet (75 mg total) by mouth daily.   No facility-administered encounter medications on file as of 03/16/2021.   Allergies  Allergen Reactions   Cefdinir Other (See Comments)    Swelling of the throat   Patient Active Problem List   Diagnosis Date Noted   Syncope 12/04/2020   Syncope and collapse 12/04/2020   Shortness of breath    Type 2 diabetes mellitus with complication, without long-term current use of insulin ([place]) 02/18/2020   Hypothyroidism 02/18/2020   Unstable angina (H[facility])    Vertigo 03/19/2018   NSVT (nonsustained ventricular tachycardia) 03/18/2018   Chest pain 08/09/2011   DIZZINESS 11/30/2008   HYPERCHOLESTEROLEMIA 10/28/2008   Hyperlipidemia 10/28/2008   HYPERTENSION, BENIGN 10/28/2008   Essential hypertension 10/28/2008   CAD (coronary artery disease) 10/28/2008   GERD 10/28/2008   CHEST PAIN-PRECORDIAL 10/28/2008   Social History   Socioeconomic History   Marital status: Married    Spouse name: Not on file   Number of children: 3   Years of education: Not on file   Highest education level: Not on file  Occupational History   Not on file  Tobacco Use   Smoking status: Never   Smokeless tobacco: Never  Vaping Use   Vaping Use: Never used  Substance and Sexual Activity   Alcohol use: No   Drug use: No   Sexual activity: Yes    Comment: married  Other Topics Concern   Not on file  Social History Narrative   Not on file   Social Determinants of Health   Financial Resource Strain: Not on file  Food Insecurity: Not on file  Transportation Needs: Not on file  Physical Activity: Not on file  Stress: Not on file  Social Connections: Not on file  Intimate Partner Violence: Not on file    Peter Brooks family history includes Colon cancer (age of onset: 55) in his paternal uncle; Coronary  artery disease in his father; Heart attack in his mother; Stomach cancer in his paternal grandmother.      Objective:    Vitals:   03/16/21 1456  BP: 110/72  Pulse: 62    Physical Exam Well-developed well-nourished elderly WM  in no acute distress.  Height, Weight, 268 BMI 34.4  HEENT; nontraumatic normocephalic, EOMI, PE R LA, sclera anicteric.  Neuro/Psych; alert and oriented x4, grossly nonfocal mood and affect appropriate        Assessment & Plan:   #78 75 year old white male with previous history of adenomatous colon polyps, negative colonoscopy October 2017 with exception of one 8 mm hyperplastic polyp who comes in today to discuss follow-up surveillance colonoscopy. He is currently asymptomatic #2 coronary artery disease status post drug-eluting stent September 2021 recently stopped  Plavix, repeat cath April 2022 with stable disease #3 atrial fibrillation-controlled #4 history of NSVT #5 adult onset diabetes mellitus #6.  Obesity #7.  GERD-intermittent nocturnal symptoms not on chronic therapy  Plan; We discussed current colon cancer surveillance recommendations, including stopping follow-up surveillance between the age of 85-80. Given last colonoscopy was negative other than 1 hyperplastic polyp ,advanced age of 47, and patient's comorbidities, I do not think that he needs to undergo follow-up colonoscopy, or future colonoscopies for screening purposes. Patient is in agreement with this plan . Start Pepcid 20 mg every afternoon after dinner OTC for intermittent GERD symptoms.  Patient is encouraged to call if that is not effective and can start him on a prescription medication. Happy to see him in follow-up for any GI issues.   Nadja Lina S Rylann Munford PA-C 03/16/2021   Cc: Street, [place], [place]

## 2021-03-17 NOTE — Progress Notes (Signed)
I agree with the above note, plan 

## 2021-03-20 ENCOUNTER — Other Ambulatory Visit: Payer: Self-pay | Admitting: Cardiovascular Disease

## 2021-03-27 ENCOUNTER — Other Ambulatory Visit: Payer: Self-pay | Admitting: Physician Assistant

## 2021-03-30 ENCOUNTER — Telehealth: Payer: Self-pay | Admitting: Cardiovascular Disease

## 2021-03-30 DIAGNOSIS — D6869 Other thrombophilia: Secondary | ICD-10-CM | POA: Diagnosis not present

## 2021-03-30 DIAGNOSIS — E1169 Type 2 diabetes mellitus with other specified complication: Secondary | ICD-10-CM | POA: Diagnosis not present

## 2021-03-30 DIAGNOSIS — R351 Nocturia: Secondary | ICD-10-CM | POA: Diagnosis not present

## 2021-03-30 DIAGNOSIS — I5189 Other ill-defined heart diseases: Secondary | ICD-10-CM | POA: Diagnosis not present

## 2021-03-30 DIAGNOSIS — E039 Hypothyroidism, unspecified: Secondary | ICD-10-CM | POA: Diagnosis not present

## 2021-03-30 DIAGNOSIS — Z79899 Other long term (current) drug therapy: Secondary | ICD-10-CM | POA: Diagnosis not present

## 2021-03-30 DIAGNOSIS — F411 Generalized anxiety disorder: Secondary | ICD-10-CM | POA: Diagnosis not present

## 2021-03-30 DIAGNOSIS — E785 Hyperlipidemia, unspecified: Secondary | ICD-10-CM | POA: Diagnosis not present

## 2021-03-30 DIAGNOSIS — I480 Paroxysmal atrial fibrillation: Secondary | ICD-10-CM | POA: Diagnosis not present

## 2021-03-30 DIAGNOSIS — I25119 Atherosclerotic heart disease of native coronary artery with unspecified angina pectoris: Secondary | ICD-10-CM | POA: Diagnosis not present

## 2021-03-30 DIAGNOSIS — N401 Enlarged prostate with lower urinary tract symptoms: Secondary | ICD-10-CM | POA: Diagnosis not present

## 2021-03-30 DIAGNOSIS — I471 Supraventricular tachycardia: Secondary | ICD-10-CM

## 2021-03-30 NOTE — Telephone Encounter (Signed)
I spoke with the patient.  He saw his PCP today and had to sit and wait before his BP could accurately be taken due to his heart skipping all around.  After resting 15 min his BP was obtained 114/  He did not notice the skipping at that time but has been experiencing more freq episodes of his heart pounding and skipping.  Often it happens during periods of exertion - walking the dog.  Occurred the other night at 3am when he got up to use the bathroom.  Pounding and irregular beats and took about an hour to settle down.  His Toprol was increased to 150 mg daily in May after he wore the monitor.  For a while the episodes decreased but lately he is noticing them more frequently.  When he has them, he gets lightheaded, weak and short of breath.    I adv pt that I would forward to Dr. Angelena Form for review and will call him back with further recommendations.

## 2021-03-30 NOTE — Telephone Encounter (Signed)
Patient c/o Palpitations:  High priority if patient c/o lightheadedness, shortness of breath, or chest pain  How long have you had palpitations/irregular HR/ Afib? Are you having the symptoms now? 15 minutes.. no current symptoms  Are you currently experiencing lightheadedness, SOB or CP? no  Do you have a history of afib (atrial fibrillation) or irregular heart rhythm? yes  Have you checked your BP or HR? (document readings if available): hr 68 bp 114/70  Are you experiencing any other symptoms? no

## 2021-03-31 NOTE — Telephone Encounter (Signed)
Message from Dr. Angelena Form: Peter Brooks and I had talked about possibly referring him to EP if he had more symptoms. Not sure we can go much higher on his beta blocker with history of soft BP at times. Can we make an appt for him to see EP? Thanks, chris      Referral placed to EP.  Called pt to let him know.  He is appreciative for assistance.

## 2021-04-10 ENCOUNTER — Other Ambulatory Visit: Payer: Self-pay

## 2021-04-10 ENCOUNTER — Encounter: Payer: Self-pay | Admitting: Internal Medicine

## 2021-04-10 ENCOUNTER — Ambulatory Visit: Payer: PPO | Admitting: Internal Medicine

## 2021-04-10 VITALS — BP 120/82 | HR 67 | Ht 74.0 in | Wt 266.0 lb

## 2021-04-10 DIAGNOSIS — I4729 Other ventricular tachycardia: Secondary | ICD-10-CM | POA: Diagnosis not present

## 2021-04-10 DIAGNOSIS — R002 Palpitations: Secondary | ICD-10-CM | POA: Diagnosis not present

## 2021-04-10 MED ORDER — METOPROLOL SUCCINATE ER 100 MG PO TB24: 150.0000 mg | ORAL_TABLET | Freq: Every day | ORAL | 0 refills | Status: DC

## 2021-04-10 MED ORDER — AMIODARONE HCL 200 MG PO TABS: 200.0000 mg | ORAL_TABLET | Freq: Every day | ORAL | 3 refills | Status: DC

## 2021-04-10 MED ORDER — METOPROLOL SUCCINATE ER 100 MG PO TB24: ORAL_TABLET | ORAL | 3 refills | Status: DC

## 2021-04-10 NOTE — Patient Instructions (Addendum)
Medication Instructions:  Your physician has recommended you make the following change in your medication:    START taking amiodarone 200 mg-  Take one tablet by mouth daily  2.   In 1 week-decrease metoprolol succinate- Take 100 mg by mouth daily  3.   In 3 weeks-decrease metoprolol succinate-  Take 50 mg by mouth daily  4.  In 6 weeks-  STOP metoprolol   Labwork: None ordered.  Testing/Procedures: None ordered.  Follow-Up: Your physician wants you to follow-up in: 8 weeks with Cristopher Peru, MD   June 13, 2021 at 11:30 am  Any Other Special Instructions Will Be Listed Below (If Applicable).  If you need a refill on your cardiac medications before your next appointment, please call your pharmacy.    Amiodarone Tablets What is this medication? AMIODARONE (a MEE oh da rone) prevents and treats a fast or irregular heartbeat (arrhythmia). It works by slowing down overactive electric signals in the heart, which stabilizes your heart rhythm. It belongs to a group of medications called antiarrhythmics. This medicine may be used for other purposes; ask your health care provider or pharmacist if you have questions. COMMON BRAND NAME(S): Cordarone, Pacerone What should I tell my care team before I take this medication? They need to know if you have any of these conditions: Liver disease Lung disease Other heart problems Thyroid disease An unusual or allergic reaction to amiodarone, iodine, other medications, foods, dyes, or preservatives Pregnant or trying to get pregnant Breast-feeding How should I use this medication? Take this medication by mouth with a glass of water. Follow the directions on the prescription label. You can take this medication with or without food. However, you should always take it the same way each time. Take your doses at regular intervals. Do not take your medication more often than directed. Do not stop taking except on the advice of your care team. A  special MedGuide will be given to you by the pharmacist with each prescription and refill. Be sure to read this information carefully each time. Talk to your care team regarding the use of this medication in children. Special care may be needed. Overdosage: If you think you have taken too much of this medicine contact a poison control center or emergency room at once. NOTE: This medicine is only for you. Do not share this medicine with others. What if I miss a dose? If you miss a dose, take it as soon as you can. If it is almost time for your next dose, take only that dose. Do not take double or extra doses. What may interact with this medication? Do not take this medication with any of the following: Abarelix Apomorphine Arsenic trioxide Certain antibiotics like erythromycin, gemifloxacin, levofloxacin, pentamidine Certain medications for depression like amoxapine, tricyclic antidepressants Certain medications for fungal infections like fluconazole, itraconazole, ketoconazole, posaconazole, voriconazole Certain medications for irregular heartbeat like disopyramide, dronedarone, ibutilide, propafenone, sotalol Certain medications for malaria like chloroquine, halofantrine Cisapride Droperidol Haloperidol [place]orn Maprotiline Methadone Phenothiazines like chlorpromazine, mesoridazine, thioridazine Pimozide Ranolazine Red yeast rice Vardenafil This medication may also interact with the following: Antiviral medications for HIV or AIDS Certain medications for blood pressure, heart disease, irregular heart beat Certain medications for cholesterol like atorvastatin, cerivastatin, lovastatin, simvastatin Certain medications for hepatitis C like sofosbuvir and ledipasvir; sofosbuvir Certain medications for seizures like phenytoin Certain medications for thyroid problems Certain medications that treat or prevent blood clots like  warfarin Cholestyramine Cimetidine Clopidogrel Cyclosporine Dextromethorphan Diuretics Dofetilide Fentanyl General  anesthetics Grapefruit juice Lidocaine Loratadine Methotrexate Other medications that prolong the QT interval (cause an abnormal heart rhythm) Procainamide Quinidine Rifabutin, rifampin, or rifapentine St. John's Wort Trazodone Ziprasidone This list may not describe all possible interactions. Give your health care provider a list of all the medicines, herbs, non-prescription drugs, or dietary supplements you use. Also tell them if you smoke, drink alcohol, or use illegal drugs. Some items may interact with your medicine. What should I watch for while using this medication? Your condition will be monitored closely when you first begin therapy. Often, this medication is first started in a hospital or other monitored health care setting. Once you are on maintenance therapy, visit your care team for regular checks on your progress. Because your condition and use of this medication carry some risk, it is a good idea to carry an identification card, necklace or bracelet with details of your condition, medications, and care team. You may get drowsy or dizzy. Do not drive, use machinery, or do anything that needs mental alertness until you know how this medication affects you. Do not stand or sit up quickly, especially if you are an older patient. This reduces the risk of dizzy or fainting spells. This medication can make you more sensitive to the sun. Keep out of the sun. If you cannot avoid being in the sun, wear protective clothing and use sunscreen. Do not use sun lamps or tanning beds/booths. You should have regular eye exams before and during treatment. Call your care team if you have blurred vision, see halos, or your eyes become sensitive to light. Your eyes may get dry. It may be helpful to use a lubricating eye solution or artificial tears solution. If you are going to have  surgery or a procedure that requires contrast dyes, tell your care team that you are taking this medication. What side effects may I notice from receiving this medication? Side effects that you should report to your care team as soon as possible: Allergic reactions--skin rash, itching, hives, swelling of the face, lips, tongue, or throat Bluish-gray skin Change in vision such as blurry vision, seeing halos around lights, vision loss Heart failure--shortness of breath, swelling of the ankles, feet, or hands, sudden weight gain, unusual weakness or fatigue Heart rhythm changes--fast or irregular heartbeat, dizziness, feeling faint or lightheaded, chest pain, trouble breathing High thyroid levels (hyperthyroidism)--fast or irregular heartbeat, weight loss, excessive sweating or sensitivity to heat, tremors or shaking, anxiety, nervousness, irregular menstrual cycle or spotting Liver injury--right upper belly pain, loss of appetite, nausea, light-colored stool, dark yellow or brown urine, yellowing skin or eyes, unusual weakness or fatigue Low thyroid levels (hypothyroidism)--unusual weakness or fatigue, sensitivity to cold, constipation, hair loss, dry skin, weight gain, feelings of depression Lung injury--shortness of breath or trouble breathing, cough, spitting up blood, chest pain, fever Pain, tingling, or numbness in the hands or feet, muscle weakness, trouble walking, loss of balance or coordination Side effects that usually do not require medical attention (report to your care team if they continue or are bothersome): Nausea Vomiting This list may not describe all possible side effects. Call your doctor for medical advice about side effects. You may report side effects to FDA at 1-800-FDA-1088. Where should I keep my medication? Keep out of the reach of children and pets. Store at room temperature between 20 and 25 degrees C (68 and 77 degrees F). Protect from light. Keep container tightly  closed. Throw away any unused medication after the expiration date. NOTE:  This sheet is a summary. It may not cover all possible information. If you have questions about this medicine, talk to your doctor, pharmacist, or health care provider.  2022 Elsevier/Gold Standard (2020-07-01 00:00:00)

## 2021-04-10 NOTE — Progress Notes (Signed)
HPI Peter Brooks is referred by Dr. Angelena Form for evaluation of PVC's and palpitations. 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 He has been found to have PVC's and was placed on toprol. He has had gradually increasing doses and has now become symptomatic from his beta blockers thought the PVC's are much improved. He denies chest pain or sob. He has a generalized lack of energy. No syncope.  Allergies  Allergen Reactions   Cefdinir Other (See Comments)    Swelling of the throat     Current Outpatient Medications  Medication Sig Dispense Refill   amLODipine (NORVASC) 5 MG tablet Take 1 tablet by mouth once daily 90 tablet 0   Ascorbic Acid (VITAMIN C) 1000 MG tablet Take 1,000 mg by mouth daily.     aspirin EC 81 MG tablet Take 81 mg by mouth every evening.      Blood Glucose Monitoring Suppl (ONE TOUCH ULTRA 2) w/Device KIT See admin instructions.     Calcium Carbonate-Vitamin D 600-400 MG-UNIT tablet Take 1 tablet by mouth daily.     Coenzyme Q10 (COQ10) 200 MG CAPS Take 200 mg by mouth daily.     diazepam (VALIUM) 2 MG tablet Take 1 tablet (2 mg total) by mouth every 12 (twelve) hours as needed for anxiety. 30 tablet 1   docusate sodium (COLACE) 100 MG capsule Take 100 mg by mouth daily as needed for mild constipation.     furosemide (LASIX) 40 MG tablet TAKE 1 TABLET BY MOUTH ONCE DAILY. MAY TAKE 1 EXTRA TABLET DAILY AS NEEDED FOR WEIGHT GAIN OR SWELLING 180 tablet 0   levothyroxine (SYNTHROID, LEVOTHROID) 50 MCG tablet Take 50 mcg by mouth daily.     metFORMIN (GLUCOPHAGE) 500 MG tablet Take 500 mg by mouth 2 (two) times daily.     metoprolol succinate (TOPROL XL) 100 MG 24 hr tablet Take 1.5 tablets (150 mg total) by mouth daily. 180 tablet 3   Multiple Vitamin (MULITIVITAMIN WITH MINERALS) TABS Take 1 tablet by mouth daily.      Multiple Vitamins-Minerals (PRESERVISION AREDS 2) CAPS Take 1 capsule by mouth 2 (two) times daily.      nitroGLYCERIN (NITROSTAT) 0.4 MG SL tablet Place 1 tablet (0.4 mg total) under the tongue every 5 (five) minutes as needed. 25 tablet 3   ONETOUCH ULTRA test strip USE 1 STRIP TO CHECK GLUCOSE ONCE DAILY     potassium chloride SA (KLOR-CON) 20 MEQ tablet Take 1 tablet (20 mEq total) by mouth 2 (two) times daily. 180 tablet 3   Probiotic Product (PROBIOTIC DAILY PO) Take 1 tablet by mouth daily.     rosuvastatin (CRESTOR) 10 MG tablet Take 10 mg by mouth every evening.     No current facility-administered medications for this visit.     Past Medical History:  Diagnosis Date   Anxiety    Arthritis    CAD (coronary artery disease)    LHC 02/18/2020: prior stent to RPDA patent, 90% stenosis of OM1 s/p PTCA/DES, 50% 1st Diag, 50% proximal to mid LAD, 20% distal LAD, 30% ostial to proximal CX, 30% OM2.   Cataract    removed bilaterally    Chest pain    Diabetes mellitus without complication (HCC)    Dizziness    GERD (gastroesophageal reflux disease)  Hyperlipidemia    Hypertension    Hypothyroidism    Lumbar herniated disc     ROS:   All systems reviewed and negative except as noted in the HPI.   Past Surgical History:  Procedure Laterality Date   CARDIAC CATHETERIZATION  11/11/2008   CATARACT EXTRACTION Right    CATARACT EXTRACTION Left    COLONOSCOPY     CORONARY STENT INTERVENTION N/A 03/11/2018   Procedure: CORONARY STENT INTERVENTION;  Surgeon: Martinique, Peter M, MD;  Location: [facility];  Service: Cardiovascular;  Laterality: N/A;   CORONARY STENT INTERVENTION N/A 02/18/2020   Procedure: CORONARY STENT INTERVENTION;  Surgeon: Burnell Blanks, MD;  Location: [facility];  Service: Cardiovascular;  Laterality: N/A;   KNEE ARTHROSCOPY Bilateral 2010 Right   POLYPECTOMY     RIGHT/LEFT HEART CATH AND CORONARY ANGIOGRAPHY N/A 03/11/2018   Procedure:  RIGHT/LEFT HEART CATH AND CORONARY ANGIOGRAPHY;  Surgeon: Martinique, Peter M, MD;  Location: [facility];  Service: Cardiovascular;  Laterality: N/A;   RIGHT/LEFT HEART CATH AND CORONARY ANGIOGRAPHY N/A 02/18/2020   Procedure: RIGHT/LEFT HEART CATH AND CORONARY ANGIOGRAPHY;  Surgeon: Burnell Blanks, MD;  Location: [facility];  Service: Cardiovascular;  Laterality: N/A;   RIGHT/LEFT HEART CATH AND CORONARY ANGIOGRAPHY N/A 09/06/2020   Procedure: RIGHT/LEFT HEART CATH AND CORONARY ANGIOGRAPHY;  Surgeon: Belva Crome, MD;  Location: [facility];  Service: Cardiovascular;  Laterality: N/A;     Family History  Problem Relation Age of Onset   Colon cancer Paternal Uncle 41   Coronary artery disease Father        with CABG valve replacement in his 33's   Heart attack Mother        in her 72's   Stomach cancer Paternal Grandmother    Esophageal cancer Neg Hx    Rectal cancer Neg Hx    Colon polyps Neg Hx      Social History   Socioeconomic History   Marital status: Married    Spouse name: Not on file   Number of children: 3   Years of education: Not on file   Highest education level: Not on file  Occupational History   Not on file  Tobacco Use   Smoking status: Never   Smokeless tobacco: Never  Vaping Use   Vaping Use: Never used  Substance and Sexual Activity   Alcohol use: No   Drug use: No   Sexual activity: Yes    Comment: married  Other Topics Concern   Not on file  Social History Narrative   Not on file   Social Determinants of Health   Financial Resource Strain: Not on file  Food Insecurity: Not on file  Transportation Needs: Not on file  Physical Activity: Not on file  Stress: Not on file  Social Connections: Not on file  Intimate Partner Violence: Not on file     BP 120/82   Pulse 67   Ht '6\' 2"'  (1.88 m)   Wt 266 lb (120.7 kg)   SpO2 95%   BMI 34.15 kg/m   Physical Exam:  Well appearing NAD HEENT: Unremarkable Neck:   No JVD, no thyromegally Lymphatics:  No adenopathy Back:  No CVA tenderness Lungs:  Clear with no wheezes HEART:  Regular rate rhythm, no murmurs, no rubs, no clicks Abd:  soft, positive bowel sounds, no organomegally, no rebound, no guarding Ext:  2 plus pulses, no edema, no cyanosis, no clubbing  Skin:  No rashes no nodules Neuro:  CN II through XII intact, motor grossly intact  EKG - NSR  Assess/Plan:  PVC's - his PVC's are much improved but he has become intolerant to the higher dose of toprol and I have asked the patient to wean off of the beta blocker and start a low dose of amiodarone.  CAD - he denies anginal symptoms. We will follow closely, especially with him coming off of his beta blocker. HTN - his bp is well controlled. He might have some increase with weaning of the beta blocker. Obesity - we discussed the importance of weight loss but I would mention that his weight is not likely causing the PVC's.  Peter Overlie Shiza Thelen,MD

## 2021-04-19 DIAGNOSIS — M1711 Unilateral primary osteoarthritis, right knee: Secondary | ICD-10-CM | POA: Diagnosis not present

## 2021-04-19 DIAGNOSIS — M170 Bilateral primary osteoarthritis of knee: Secondary | ICD-10-CM | POA: Diagnosis not present

## 2021-04-19 DIAGNOSIS — M25561 Pain in right knee: Secondary | ICD-10-CM | POA: Diagnosis not present

## 2021-04-19 DIAGNOSIS — M1712 Unilateral primary osteoarthritis, left knee: Secondary | ICD-10-CM | POA: Diagnosis not present

## 2021-05-05 ENCOUNTER — Other Ambulatory Visit: Payer: Self-pay | Admitting: Cardiovascular Disease

## 2021-05-28 DIAGNOSIS — Z20828 Contact with and (suspected) exposure to other viral communicable diseases: Secondary | ICD-10-CM | POA: Diagnosis not present

## 2021-05-28 DIAGNOSIS — R0981 Nasal congestion: Secondary | ICD-10-CM | POA: Diagnosis not present

## 2021-05-28 DIAGNOSIS — R509 Fever, unspecified: Secondary | ICD-10-CM | POA: Diagnosis not present

## 2021-05-28 DIAGNOSIS — R519 Headache, unspecified: Secondary | ICD-10-CM | POA: Diagnosis not present

## 2021-06-13 ENCOUNTER — Encounter: Payer: Self-pay | Admitting: Internal Medicine

## 2021-06-13 ENCOUNTER — Ambulatory Visit: Payer: PPO | Admitting: Internal Medicine

## 2021-06-13 ENCOUNTER — Other Ambulatory Visit: Payer: Self-pay

## 2021-06-13 VITALS — BP 126/74 | HR 74 | Ht 73.0 in | Wt 268.0 lb

## 2021-06-13 DIAGNOSIS — I10 Essential (primary) hypertension: Secondary | ICD-10-CM | POA: Diagnosis not present

## 2021-06-13 DIAGNOSIS — I2510 Atherosclerotic heart disease of native coronary artery without angina pectoris: Secondary | ICD-10-CM

## 2021-06-13 DIAGNOSIS — I493 Ventricular premature depolarization: Secondary | ICD-10-CM | POA: Diagnosis not present

## 2021-06-13 NOTE — Progress Notes (Signed)
HPI Mr. Naji returns for evaluation of PVC's and palpitations. 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 He has been found to have PVC's and was placed on toprol. He has had gradually increasing doses and has now become symptomatic from his beta blockers thought the PVC's are much improved. He denies chest pain or sob. He has a generalized lack of energy. No syncope.  When I saw him last several weeks ago, I recommended that he wean off of his beta-blocker and start amiodarone for control of his ventricular arrhythmias.  In the interim he is improved.  His palpitations are markedly reduced.  He thinks that is much as anything he feels better after weaning off of the beta-blocker. Allergies  Allergen Reactions   Cefdinir Other (See Comments)    Swelling of the throat     Current Outpatient Medications  Medication Sig Dispense Refill   amiodarone (PACERONE) 200 MG tablet Take 1 tablet (200 mg total) by mouth daily. 90 tablet 3   amLODipine (NORVASC) 5 MG tablet Take 1 tablet by mouth once daily 90 tablet 0   Ascorbic Acid (VITAMIN C) 1000 MG tablet Take 1,000 mg by mouth daily.     aspirin EC 81 MG tablet Take 81 mg by mouth every evening.      Blood Glucose Monitoring Suppl (ONE TOUCH ULTRA 2) w/Device KIT See admin instructions.     Calcium Carbonate-Vitamin D 600-400 MG-UNIT tablet Take 1 tablet by mouth daily.     Coenzyme Q10 (COQ10) 200 MG CAPS Take 200 mg by mouth daily.     diazepam (VALIUM) 2 MG tablet Take 1 tablet (2 mg total) by mouth every 12 (twelve) hours as needed for anxiety. 30 tablet 1   docusate sodium (COLACE) 100 MG capsule Take 100 mg by mouth daily as needed for mild constipation.     furosemide (LASIX) 40 MG tablet TAKE 1 TABLET BY MOUTH ONCE DAILY. MAY TAKE 1 EXTRA TABLET DAILY AS NEEDED FOR WEIGHT GAIN OR  SWELLING 180 tablet 0   levothyroxine (SYNTHROID, LEVOTHROID) 50 MCG tablet Take 50 mcg by mouth daily.     metFORMIN (GLUCOPHAGE) 500 MG tablet Take 500 mg by mouth 2 (two) times daily.     Multiple Vitamin (MULITIVITAMIN WITH MINERALS) TABS Take 1 tablet by mouth daily.     Multiple Vitamins-Minerals (PRESERVISION AREDS 2) CAPS Take 1 capsule by mouth 2 (two) times daily.      nitroGLYCERIN (NITROSTAT) 0.4 MG SL tablet Place 1 tablet (0.4 mg total) under the tongue every 5 (five) minutes as needed. 25 tablet 3   ONETOUCH ULTRA test strip USE 1 STRIP TO CHECK GLUCOSE ONCE DAILY     potassium chloride SA (KLOR-CON M) 20 MEQ tablet Take 1 tablet by mouth twice daily 180 tablet 2   Probiotic Product (PROBIOTIC DAILY PO) Take 1 tablet by mouth daily.     rosuvastatin (CRESTOR) 10 MG tablet Take 10 mg by mouth every evening.     No current facility-administered medications for this visit.     Past Medical History:  Diagnosis Date   Anxiety    Arthritis    CAD (coronary artery disease)    LHC 02/18/2020: prior stent to RPDA patent, 90% stenosis of OM1 s/p PTCA/DES, 50% 1st Diag, 50% proximal to mid  LAD, 20% distal LAD, 30% ostial to proximal CX, 30% OM2.   Cataract    removed bilaterally    Chest pain    Diabetes mellitus without complication (HCC)    Dizziness    GERD (gastroesophageal reflux disease)    Hyperlipidemia    Hypertension    Hypothyroidism    Lumbar herniated disc     ROS:   All systems reviewed and negative except as noted in the HPI.   Past Surgical History:  Procedure Laterality Date   CARDIAC CATHETERIZATION  11/11/2008   CATARACT EXTRACTION Right    CATARACT EXTRACTION Left    COLONOSCOPY     CORONARY STENT INTERVENTION N/A 03/11/2018   Procedure: CORONARY STENT INTERVENTION;  Surgeon: Martinique, Peter M, MD;  Location: [facility];  Service: Cardiovascular;  Laterality: N/A;   CORONARY STENT INTERVENTION N/A 02/18/2020   Procedure: CORONARY STENT  INTERVENTION;  Surgeon: Burnell Blanks, MD;  Location: [facility];  Service: Cardiovascular;  Laterality: N/A;   KNEE ARTHROSCOPY Bilateral 2010 Right   POLYPECTOMY     RIGHT/LEFT HEART CATH AND CORONARY ANGIOGRAPHY N/A 03/11/2018   Procedure: RIGHT/LEFT HEART CATH AND CORONARY ANGIOGRAPHY;  Surgeon: Martinique, Peter M, MD;  Location: [facility] [facility];  Service: Cardiovascular;  Laterality: N/A;   RIGHT/LEFT HEART CATH AND CORONARY ANGIOGRAPHY N/A 02/18/2020   Procedure: RIGHT/LEFT HEART CATH AND CORONARY ANGIOGRAPHY;  Surgeon: Burnell Blanks, MD;  Location: [place] [facility];  Service: Cardiovascular;  Laterality: N/A;   RIGHT/LEFT HEART CATH AND CORONARY ANGIOGRAPHY N/A 09/06/2020   Procedure: RIGHT/LEFT HEART CATH AND CORONARY ANGIOGRAPHY;  Surgeon: Belva Crome, MD;  Location: [facility];  Service: Cardiovascular;  Laterality: N/A;     Family History  Problem Relation Age of Onset   Colon cancer Paternal Uncle 103   Coronary artery disease Father        with CABG valve replacement in his 82's   Heart attack Mother        in her 108's   Stomach cancer Paternal Grandmother    Esophageal cancer Neg Hx    Rectal cancer Neg Hx    Colon polyps Neg Hx      Social History   Socioeconomic History   Marital status: Married    Spouse name: Not on file   Number of children: 3   Years of education: Not on file   Highest education level: Not on file  Occupational History   Not on file  Tobacco Use   Smoking status: Never   Smokeless tobacco: Never  Vaping Use   Vaping Use: Never used  Substance and Sexual Activity   Alcohol use: No   Drug use: No   Sexual activity: Yes    Comment: married  Other Topics Concern   Not on file  Social History Narrative   Not on file   Social Determinants of Health   Financial Resource Strain: Not on file  Food Insecurity: Not on file  Transportation Needs: Not on file  Physical Activity: Not on file  Stress:  Not on file  Social Connections: Not on file  Intimate Partner Violence: Not on file     BP 126/74    Pulse 74    Ht 6' 1" (1.854 m)    Wt 268 lb (121.6 kg)    SpO2 96%    BMI 35.36 kg/m   Physical Exam:  Well appearing NAD HEENT: Unremarkable Neck:  No JVD,  no thyromegally Lymphatics:  No adenopathy Back:  No CVA tenderness Lungs:  Clear, with no wheezes, rales, or rhonchi HEART:  Regular rate rhythm, no murmurs, no rubs, no clicks Abd:  soft, positive bowel sounds, no organomegally, no rebound, no guarding Ext:  2 plus pulses, no edema, no cyanosis, no clubbing Skin:  No rashes no nodules Neuro:  CN II through XII intact, motor grossly intact  EKG -normal sinus rhythm with no PVCs   Assess/Plan:  1.  Symptomatic PVCs -his symptoms are much improved with low-dose amiodarone.  He will continue 200 mg daily.  I would anticipate seeing him back in approximately 1 year.  At that time we will plan to reduce the dose of his amiodarone from once daily 7 days a week to once daily 6 days a week. 2.  Coronary artery disease -despite coming off of his high dose of beta-blocker, he has not had recurrent anginal symptoms. 3.  Hypertension -despite coming off of his beta-blocker, his blood pressure is well controlled. 4.  Obesity -going forward he is encouraged to work on weight loss.  He is actually up 2 pounds since his last visit.  Cristopher Peru, MD

## 2021-06-13 NOTE — Patient Instructions (Addendum)
Medication Instructions:  Your physician recommends that you continue on your current medications as directed. Please refer to the Current Medication list given to you today.  Labwork: None ordered.  Testing/Procedures: None ordered.  Follow-Up: Your physician wants you to follow-up in: one year with Cristopher Peru, MD. You will receive a reminder letter in the mail two months in advance. If you don't receive a letter, please call our office to schedule the follow-up appointment.   Any Other Special Instructions Will Be Listed Below (If Applicable).  If you need a refill on your cardiac medications before your next appointment, please call your pharmacy.

## 2021-06-23 ENCOUNTER — Telehealth: Payer: Self-pay | Admitting: Internal Medicine

## 2021-06-23 MED ORDER — AMLODIPINE BESYLATE 10 MG PO TABS: 10.0000 mg | ORAL_TABLET | Freq: Every day | ORAL | 3 refills | Status: DC

## 2021-06-23 NOTE — Telephone Encounter (Signed)
Pt c/o BP issue: STAT if pt c/o blurred vision, one-sided weakness or slurred speech  1. What are your last 5 BP readings?  01/28 194/105 01/30 158/90 01/31 158/83 02/01 169/89 02/02 159/93 02/03 159/89  2. Are you having any other symptoms (ex. Dizziness, headache, blurred vision, passed out)? Occasional headache  3. What is your BP issue? Hypertension since stopping metoprolol.

## 2021-06-23 NOTE — Telephone Encounter (Signed)
Left detailed message for Pt.  Advised per Dr. Enriqueta Shutter amlodipine to 10 mg PO daily.  If this does not help with blood pressure have Pt call back (may add low dose Toprol).  Sent in new prescription to pharmacy.

## 2021-06-27 NOTE — Telephone Encounter (Signed)
Left message on VM trying to confirm if pt received message re: increasing amlodipine.  Asked that pt call back to confirm he received instructions.

## 2021-07-17 NOTE — Progress Notes (Signed)
Chief Complaint  Patient presents with   Follow-up    CAD    History of Present Illness: 76 yo male with history of CAD, chronic diastolic CHF, HTN, HLD, GERD and SVT here today for cardiac follow up.  Cardiac cath June 2010 with non-obstructive CAD and LVEF 60%.  He was seen in January 2015 and had c/o heart racing, dizziness, fatigue. Stress myoview 07/27/13 without ischemia. Echo 07/27/13 with normal LV function, no significant valve issues. Event monitor in 2015 without any arrythmias. He called our office in July 2019 with c/o palpitations. Cardiac monitor August 2019 with PVCs, PACs, short run of SVT and 4 beat run of non-sustained VT. Toprol was started. I saw him in the office in October 2019 and he c/o progressive dyspnea and chest pain with exertion. Echo October 2019 with normal LV systolic function. No significant valve disease. Cardiac cath 03/11/18 with severe stenosis in the right PDA treated with a drug eluting stent. Mild non-obstructive disease in the LAD and Circumflex. He was seen via telemedicine visit 01/19/19 and had c/o chest pain with exertion. Nuclear stress test 02/20/19 with no evidence of ischemia. Cardiac monitor September 2020 with sinus, several short runs of SVT. I saw him in May 2021 and he reported increased dyspnea and LE edema. Lasix was started. Echo 10/12/19 with LVEF=60-65%, no valve disease. Nuclear stress test August 2021 with no ischemia. He continued to have chest pain and dyspnea so we arranged a cardiac cath on 02/18/20. This showed a patent right PDA stent, non-obstructive LAD disease and a severe stenosis in the first obtuse marginal branch which was treated with a drug eluting stent. Right heart cath with low filling pressures, PCWP 7, LVEDP 8. Repeat cardiac cath April 2022 with stable CAD. He was seen by Richardson Dopp, PA-C in May 2022 and c/o ongoing weakness and dizziness. Norvasc was lowered. 14 day cardiac monitor with sinus with 19 runs of SVT, longest 2  minutes and 22 seconds. PVCs. He was admitted to [facility] in July 2022 after a possible syncopal episode while sitting at home. He was seen by Cardiology. Echo with LVEF=60-65%, no significant valve disease. He was seen in the EP clinic by Dr. Lovena Le and his Toprol was stopped. Amiodarone was started for treatment of his PVCs.   He is here today for follow up. The patient denies any chest pain, dyspnea, palpitations, lower extremity edema, orthopnea, PND, dizziness, near syncope or syncope. Overall doing well.   Primary Care Physician: Street, Sharon Mt, MD  Past Medical History:  Diagnosis Date   Anxiety    Arthritis    CAD (coronary artery disease)    LHC 02/18/2020: prior stent to RPDA patent, 90% stenosis of OM1 s/p PTCA/DES, 50% 1st Diag, 50% proximal to mid LAD, 20% distal LAD, 30% ostial to proximal CX, 30% OM2.   Cataract    removed bilaterally    Chest pain    Diabetes mellitus without complication ([place])    Dizziness    GERD (gastroesophageal reflux disease)    Hyperlipidemia    Hypertension    Hypothyroidism    Lumbar herniated disc     Past Surgical History:  Procedure Laterality Date   CARDIAC CATHETERIZATION  11/11/2008   CATARACT EXTRACTION Right    CATARACT EXTRACTION Left    COLONOSCOPY     CORONARY STENT INTERVENTION N/A 03/11/2018   Procedure: CORONARY STENT INTERVENTION;  Surgeon: Martinique, Peter M, MD;  Location: [facility];  Service: Cardiovascular;  Laterality: N/A;   CORONARY STENT INTERVENTION N/A 02/18/2020   Procedure: CORONARY STENT INTERVENTION;  Surgeon: Burnell Blanks, MD;  Location: [facility];  Service: Cardiovascular;  Laterality: N/A;   KNEE ARTHROSCOPY Bilateral 2010 Right   POLYPECTOMY     RIGHT/LEFT HEART CATH AND CORONARY ANGIOGRAPHY N/A 03/11/2018   Procedure: RIGHT/LEFT HEART CATH AND CORONARY ANGIOGRAPHY;  Surgeon: Martinique, Peter M, MD;  Location: [facility];  Service: Cardiovascular;  Laterality: N/A;    RIGHT/LEFT HEART CATH AND CORONARY ANGIOGRAPHY N/A 02/18/2020   Procedure: RIGHT/LEFT HEART CATH AND CORONARY ANGIOGRAPHY;  Surgeon: Burnell Blanks, MD;  Location: [facility];  Service: Cardiovascular;  Laterality: N/A;   RIGHT/LEFT HEART CATH AND CORONARY ANGIOGRAPHY N/A 09/06/2020   Procedure: RIGHT/LEFT HEART CATH AND CORONARY ANGIOGRAPHY;  Surgeon: Belva Crome, MD;  Location: [facility];  Service: Cardiovascular;  Laterality: N/A;    Current Outpatient Medications  Medication Sig Dispense Refill   amiodarone (PACERONE) 200 MG tablet Take 1 tablet (200 mg total) by mouth daily. 90 tablet 3   amLODipine (NORVASC) 10 MG tablet Take 1 tablet (10 mg total) by mouth daily. 90 tablet 3   Ascorbic Acid (VITAMIN C) 1000 MG tablet Take 1,000 mg by mouth daily.     aspirin EC 81 MG tablet Take 81 mg by mouth every evening.      Blood Glucose Monitoring Suppl (ONE TOUCH ULTRA 2) w/Device KIT See admin instructions.     Calcium Carbonate-Vitamin D 600-400 MG-UNIT tablet Take 1 tablet by mouth daily.     Coenzyme Q10 (COQ10) 200 MG CAPS Take 200 mg by mouth daily.     diazepam (VALIUM) 2 MG tablet Take 1 tablet (2 mg total) by mouth every 12 (twelve) hours as needed for anxiety. 30 tablet 1   furosemide (LASIX) 40 MG tablet TAKE 1 TABLET BY MOUTH ONCE DAILY. MAY TAKE 1 EXTRA TABLET DAILY AS NEEDED FOR WEIGHT GAIN OR SWELLING 180 tablet 0   levothyroxine (SYNTHROID, LEVOTHROID) 50 MCG tablet Take 50 mcg by mouth daily.     metFORMIN (GLUCOPHAGE) 500 MG tablet Take 500 mg by mouth 2 (two) times daily.     Multiple Vitamin (MULITIVITAMIN WITH MINERALS) TABS Take 1 tablet by mouth daily.     Multiple Vitamins-Minerals (PRESERVISION AREDS 2) CAPS Take 1 capsule by mouth 2 (two) times daily.      nitroGLYCERIN (NITROSTAT) 0.4 MG SL tablet Place 1 tablet (0.4 mg total) under the tongue every 5 (five) minutes as needed. 25 tablet 3   ONETOUCH ULTRA test strip USE 1 STRIP TO CHECK  GLUCOSE ONCE DAILY     potassium chloride SA (KLOR-CON M) 20 MEQ tablet Take 1 tablet by mouth twice daily 180 tablet 2   Probiotic Product (PROBIOTIC DAILY PO) Take 1 tablet by mouth daily.     rosuvastatin (CRESTOR) 10 MG tablet Take 10 mg by mouth every evening.     No current facility-administered medications for this visit.    Allergies  Allergen Reactions   Cefdinir Other (See Comments)    Swelling of the throat    Social History   Socioeconomic History   Marital status: Married    Spouse name: Not on file   Number of children: 3   Years of education: Not on file   Highest education level: Not on file  Occupational History   Not on file  Tobacco Use   Smoking status: Never  Smokeless tobacco: Never  Vaping Use   Vaping Use: Never used  Substance and Sexual Activity   Alcohol use: No   Drug use: No   Sexual activity: Yes    Comment: married  Other Topics Concern   Not on file  Social History Narrative   Not on file   Social Determinants of Health   Financial Resource Strain: Not on file  Food Insecurity: Not on file  Transportation Needs: Not on file  Physical Activity: Not on file  Stress: Not on file  Social Connections: Not on file  Intimate Partner Violence: Not on file    Family History  Problem Relation Age of Onset   Colon cancer Paternal Uncle 33   Coronary artery disease Father        with CABG valve replacement in his 10's   Heart attack Mother        in her 70's   Stomach cancer Paternal Grandmother    Esophageal cancer Neg Hx    Rectal cancer Neg Hx    Colon polyps Neg Hx     Review of Systems:  As stated in the HPI and otherwise negative.   BP 116/70    Pulse 73    Ht '6\' 1"'  (1.854 m)    Wt 268 lb (121.6 kg)    SpO2 98%    BMI 35.36 kg/m   Physical Examination:  General: Well developed, well nourished, NAD  HEENT: OP clear, mucus membranes moist  SKIN: warm, dry. No rashes. Neuro: No focal deficits  Musculoskeletal: Muscle  strength 5/5 all ext  Psychiatric: Mood and affect normal  Neck: No JVD, no carotid bruits, no thyromegaly, no lymphadenopathy.  Lungs:Clear bilaterally, no wheezes, rhonci, crackles Cardiovascular: Regular rate and rhythm. No murmurs, gallops or rubs. Abdomen:Soft. Bowel sounds present. Non-tender.  Extremities: No lower extremity edema. Pulses are 2 + in the bilateral DP/PT.  Echo July 2022:  1. Left ventricular ejection fraction, by estimation, is 60 to 65%. The  left ventricle has normal function. The left ventricle has no regional  wall motion abnormalities. Left ventricular diastolic parameters are  consistent with Grade I diastolic  dysfunction (impaired relaxation).   2. Right ventricular systolic function is normal. The right ventricular  size is normal. There is mildly elevated pulmonary artery systolic  pressure.   3. Left atrial size was mildly dilated.   4. The mitral valve is normal in structure. No evidence of mitral valve  regurgitation. No evidence of mitral stenosis.   5. The aortic valve is normal in structure. There is mild calcification  of the aortic valve. Aortic valve regurgitation is not visualized. Mild  aortic valve sclerosis is present, with no evidence of aortic valve  stenosis.   6. The inferior vena cava is normal in size with greater than 50%  respiratory variability, suggesting right atrial pressure of 3 mmHg.   EKG:  EKG is not ordered today. The ekg ordered today demonstrates   Recent Labs: 08/23/2020: NT-Pro BNP 137 12/04/2020: B Natriuretic Peptide 147.5; BUN 13; Creatinine, Ser 1.07; Hemoglobin 13.4; Platelets 237; Potassium 4.3; Sodium 138   Lipid Panel Followed in primary care   Wt Readings from Last 3 Encounters:  07/18/21 268 lb (121.6 kg)  06/13/21 268 lb (121.6 kg)  04/10/21 266 lb (120.7 kg)     Other studies Reviewed: Additional studies/ records that were reviewed today include: . Review of the above records demonstrates:     Assessment and Plan:  1. CAD with without angina: He has no chest pain.  CAD stable by cardiac cath April 2022. Will continue ASA and statin. Beta blocker stopped due to fatigue.   2. HTN: BP is well controlled. No changes today  3. HLD:  Lipids followed in primary care. LDL 52 in January 2022. Continue statin. Will check lipids and LFTs now.   4. PVCs/PACs/SVT: Rare palpitations. Continue amiodarone.   5. Chronic diastolic CHF: LV systolic function normal by echo in July 2022. No valve disease. Weight is stable. No volume overload on exam. Continue Lasix.   Current medicines are reviewed at length with the patient today.  The patient does not have concerns regarding medicines.  The following changes have been made:    Labs/ tests ordered today include:   Orders Placed This Encounter  Procedures   Hepatic function panel   Lipid panel     Disposition:   Follow up me in 12 months.   Signed, Lauree Chandler, MD 07/18/2021 10:42 AM    [facility] [address], [place], [place]  [address] Phone: [phone]; Fax: [phone]

## 2021-07-18 ENCOUNTER — Encounter: Payer: Self-pay | Admitting: Cardiovascular Disease

## 2021-07-18 ENCOUNTER — Other Ambulatory Visit: Payer: Self-pay

## 2021-07-18 ENCOUNTER — Ambulatory Visit: Payer: PPO | Admitting: Cardiovascular Disease

## 2021-07-18 VITALS — BP 116/70 | HR 73 | Ht 73.0 in | Wt 268.0 lb

## 2021-07-18 DIAGNOSIS — E782 Mixed hyperlipidemia: Secondary | ICD-10-CM | POA: Diagnosis not present

## 2021-07-18 DIAGNOSIS — I2510 Atherosclerotic heart disease of native coronary artery without angina pectoris: Secondary | ICD-10-CM | POA: Diagnosis not present

## 2021-07-18 DIAGNOSIS — I10 Essential (primary) hypertension: Secondary | ICD-10-CM

## 2021-07-18 DIAGNOSIS — I5032 Chronic diastolic (congestive) heart failure: Secondary | ICD-10-CM | POA: Diagnosis not present

## 2021-07-18 DIAGNOSIS — I471 Supraventricular tachycardia, unspecified: Secondary | ICD-10-CM

## 2021-07-18 LAB — LIPID PANEL
Chol/HDL Ratio: 2.9 ratio (ref 0.0–5.0)
Cholesterol, Total: 106 mg/dL (ref 100–199)
HDL: 37 mg/dL — ABNORMAL LOW (ref >39)
LDL Chol Calc (NIH): 46 mg/dL (ref 0–99)
Triglycerides: 132 mg/dL (ref 0–149)
VLDL Cholesterol Cal: 23 mg/dL (ref 5–40)

## 2021-07-18 LAB — HEPATIC FUNCTION PANEL
ALT: 23 IU/L (ref 0–44)
AST: 18 IU/L (ref 0–40)
Albumin: 4.8 g/dL — ABNORMAL HIGH (ref 3.7–4.7)
Alkaline Phosphatase: 70 IU/L (ref 44–121)
Bilirubin Total: 0.4 mg/dL (ref 0.0–1.2)
Bilirubin, Direct: 0.14 mg/dL (ref 0.00–0.40)
Total Protein: 7.3 g/dL (ref 6.0–8.5)

## 2021-07-18 NOTE — Patient Instructions (Signed)
Medication Instructions:  No changes *If you need a refill on your cardiac medications before your next appointment, please call your pharmacy*   Lab Work: Today: lipids/liver function  If you have labs (blood work) drawn today and your tests are completely normal, you will receive your results only by: [place] (if you have MyChart) OR A paper copy in the mail If you have any lab test that is abnormal or we need to change your treatment, we will call you to review the results.   Testing/Procedures: none   Follow-Up: At [facility], you and your health needs are our priority.  As part of our continuing mission to provide you with exceptional heart care, we have created designated Provider Care Teams.  These Care Teams include your primary Cardiologist (physician) and Advanced Practice Providers (APPs -  Physician Assistants and Nurse Practitioners) who all work together to provide you with the care you need, when you need it.  Your next appointment:   12 month(s)  The format for your next appointment:   In Person  Provider:   Lauree Chandler, MD

## 2021-08-14 ENCOUNTER — Ambulatory Visit: Payer: PPO | Admitting: Cardiovascular Disease

## 2021-08-22 DIAGNOSIS — J209 Acute bronchitis, unspecified: Secondary | ICD-10-CM | POA: Diagnosis not present

## 2021-08-22 DIAGNOSIS — J019 Acute sinusitis, unspecified: Secondary | ICD-10-CM | POA: Diagnosis not present

## 2021-09-01 ENCOUNTER — Other Ambulatory Visit: Payer: Self-pay | Admitting: Cardiovascular Disease

## 2021-09-04 ENCOUNTER — Other Ambulatory Visit: Payer: Self-pay | Admitting: Cardiovascular Disease

## 2021-09-15 DIAGNOSIS — D6869 Other thrombophilia: Secondary | ICD-10-CM | POA: Diagnosis not present

## 2021-09-15 DIAGNOSIS — I480 Paroxysmal atrial fibrillation: Secondary | ICD-10-CM | POA: Diagnosis not present

## 2021-09-15 DIAGNOSIS — E1169 Type 2 diabetes mellitus with other specified complication: Secondary | ICD-10-CM | POA: Diagnosis not present

## 2021-09-15 DIAGNOSIS — I25119 Atherosclerotic heart disease of native coronary artery with unspecified angina pectoris: Secondary | ICD-10-CM | POA: Diagnosis not present

## 2021-09-15 DIAGNOSIS — E785 Hyperlipidemia, unspecified: Secondary | ICD-10-CM | POA: Diagnosis not present

## 2021-09-15 DIAGNOSIS — Z6836 Body mass index (BMI) 36.0-36.9, adult: Secondary | ICD-10-CM | POA: Diagnosis not present

## 2021-10-23 DIAGNOSIS — E039 Hypothyroidism, unspecified: Secondary | ICD-10-CM | POA: Diagnosis not present

## 2021-10-23 DIAGNOSIS — E1169 Type 2 diabetes mellitus with other specified complication: Secondary | ICD-10-CM | POA: Diagnosis not present

## 2021-10-23 DIAGNOSIS — E785 Hyperlipidemia, unspecified: Secondary | ICD-10-CM | POA: Diagnosis not present

## 2021-10-23 DIAGNOSIS — Z79899 Other long term (current) drug therapy: Secondary | ICD-10-CM | POA: Diagnosis not present

## 2021-10-24 DIAGNOSIS — M79672 Pain in left foot: Secondary | ICD-10-CM | POA: Diagnosis not present

## 2021-10-24 DIAGNOSIS — M7662 Achilles tendinitis, left leg: Secondary | ICD-10-CM | POA: Diagnosis not present

## 2021-10-27 DIAGNOSIS — I480 Paroxysmal atrial fibrillation: Secondary | ICD-10-CM | POA: Diagnosis not present

## 2021-10-27 DIAGNOSIS — I5189 Other ill-defined heart diseases: Secondary | ICD-10-CM | POA: Diagnosis not present

## 2021-10-27 DIAGNOSIS — F411 Generalized anxiety disorder: Secondary | ICD-10-CM | POA: Diagnosis not present

## 2021-10-27 DIAGNOSIS — E785 Hyperlipidemia, unspecified: Secondary | ICD-10-CM | POA: Diagnosis not present

## 2021-10-27 DIAGNOSIS — E039 Hypothyroidism, unspecified: Secondary | ICD-10-CM | POA: Diagnosis not present

## 2021-10-27 DIAGNOSIS — Z6835 Body mass index (BMI) 35.0-35.9, adult: Secondary | ICD-10-CM | POA: Diagnosis not present

## 2021-10-27 DIAGNOSIS — I25119 Atherosclerotic heart disease of native coronary artery with unspecified angina pectoris: Secondary | ICD-10-CM | POA: Diagnosis not present

## 2021-10-27 DIAGNOSIS — D6869 Other thrombophilia: Secondary | ICD-10-CM | POA: Diagnosis not present

## 2021-10-27 DIAGNOSIS — E1169 Type 2 diabetes mellitus with other specified complication: Secondary | ICD-10-CM | POA: Diagnosis not present

## 2021-11-30 ENCOUNTER — Ambulatory Visit: Payer: PPO | Admitting: Internal Medicine

## 2021-12-29 DIAGNOSIS — G4733 Obstructive sleep apnea (adult) (pediatric): Secondary | ICD-10-CM | POA: Diagnosis not present

## 2021-12-29 DIAGNOSIS — R0602 Shortness of breath: Secondary | ICD-10-CM | POA: Diagnosis not present

## 2021-12-30 DIAGNOSIS — R0602 Shortness of breath: Secondary | ICD-10-CM | POA: Diagnosis not present

## 2021-12-30 DIAGNOSIS — G4733 Obstructive sleep apnea (adult) (pediatric): Secondary | ICD-10-CM | POA: Diagnosis not present

## 2022-01-09 DIAGNOSIS — G4733 Obstructive sleep apnea (adult) (pediatric): Secondary | ICD-10-CM | POA: Diagnosis not present

## 2022-01-15 DIAGNOSIS — N401 Enlarged prostate with lower urinary tract symptoms: Secondary | ICD-10-CM | POA: Diagnosis not present

## 2022-01-15 DIAGNOSIS — R351 Nocturia: Secondary | ICD-10-CM | POA: Diagnosis not present

## 2022-01-15 DIAGNOSIS — E785 Hyperlipidemia, unspecified: Secondary | ICD-10-CM | POA: Diagnosis not present

## 2022-01-15 DIAGNOSIS — E1169 Type 2 diabetes mellitus with other specified complication: Secondary | ICD-10-CM | POA: Diagnosis not present

## 2022-01-18 DIAGNOSIS — N401 Enlarged prostate with lower urinary tract symptoms: Secondary | ICD-10-CM | POA: Diagnosis not present

## 2022-01-18 DIAGNOSIS — R972 Elevated prostate specific antigen [PSA]: Secondary | ICD-10-CM | POA: Diagnosis not present

## 2022-01-18 DIAGNOSIS — D6869 Other thrombophilia: Secondary | ICD-10-CM | POA: Diagnosis not present

## 2022-01-18 DIAGNOSIS — E039 Hypothyroidism, unspecified: Secondary | ICD-10-CM | POA: Diagnosis not present

## 2022-01-18 DIAGNOSIS — I5189 Other ill-defined heart diseases: Secondary | ICD-10-CM | POA: Diagnosis not present

## 2022-01-18 DIAGNOSIS — I480 Paroxysmal atrial fibrillation: Secondary | ICD-10-CM | POA: Diagnosis not present

## 2022-01-18 DIAGNOSIS — E1169 Type 2 diabetes mellitus with other specified complication: Secondary | ICD-10-CM | POA: Diagnosis not present

## 2022-01-18 DIAGNOSIS — R351 Nocturia: Secondary | ICD-10-CM | POA: Diagnosis not present

## 2022-01-18 DIAGNOSIS — E785 Hyperlipidemia, unspecified: Secondary | ICD-10-CM | POA: Diagnosis not present

## 2022-01-18 DIAGNOSIS — Z0000 Encounter for general adult medical examination without abnormal findings: Secondary | ICD-10-CM | POA: Diagnosis not present

## 2022-01-18 DIAGNOSIS — I25119 Atherosclerotic heart disease of native coronary artery with unspecified angina pectoris: Secondary | ICD-10-CM | POA: Diagnosis not present

## 2022-01-18 DIAGNOSIS — G4733 Obstructive sleep apnea (adult) (pediatric): Secondary | ICD-10-CM | POA: Diagnosis not present

## 2022-01-26 DIAGNOSIS — G4730 Sleep apnea, unspecified: Secondary | ICD-10-CM | POA: Diagnosis not present

## 2022-02-05 DIAGNOSIS — J449 Chronic obstructive pulmonary disease, unspecified: Secondary | ICD-10-CM | POA: Diagnosis not present

## 2022-02-05 DIAGNOSIS — J410 Simple chronic bronchitis: Secondary | ICD-10-CM | POA: Diagnosis not present

## 2022-02-05 DIAGNOSIS — J430 Unilateral pulmonary emphysema [MacLeod's syndrome]: Secondary | ICD-10-CM | POA: Diagnosis not present

## 2022-02-05 DIAGNOSIS — J439 Emphysema, unspecified: Secondary | ICD-10-CM | POA: Diagnosis not present

## 2022-02-09 DIAGNOSIS — G4733 Obstructive sleep apnea (adult) (pediatric): Secondary | ICD-10-CM | POA: Diagnosis not present

## 2022-02-13 DIAGNOSIS — L578 Other skin changes due to chronic exposure to nonionizing radiation: Secondary | ICD-10-CM | POA: Diagnosis not present

## 2022-02-13 DIAGNOSIS — L820 Inflamed seborrheic keratosis: Secondary | ICD-10-CM | POA: Diagnosis not present

## 2022-02-13 DIAGNOSIS — L814 Other melanin hyperpigmentation: Secondary | ICD-10-CM | POA: Diagnosis not present

## 2022-02-16 DIAGNOSIS — E119 Type 2 diabetes mellitus without complications: Secondary | ICD-10-CM | POA: Diagnosis not present

## 2022-02-16 DIAGNOSIS — M9262 Juvenile osteochondrosis of tarsus, left ankle: Secondary | ICD-10-CM | POA: Diagnosis not present

## 2022-02-16 DIAGNOSIS — M7660 Achilles tendinitis, unspecified leg: Secondary | ICD-10-CM | POA: Diagnosis not present

## 2022-02-20 DIAGNOSIS — I5030 Unspecified diastolic (congestive) heart failure: Secondary | ICD-10-CM | POA: Insufficient documentation

## 2022-02-20 HISTORY — DX: Unspecified diastolic (congestive) heart failure: I50.30

## 2022-02-20 NOTE — Progress Notes (Unsigned)
Cardiology Office Note:    Date:  02/21/2022   ID:  Peter Brooks, DOB [birth year], MRN [id number]  PCP:  Street, Sharon Mt, MD  [place] Providers Cardiologist:  Lauree Chandler, MD     Referring MD: Venetia Maxon, Sharon Mt, *   Chief Complaint:  Leg Swelling    Patient Profile: Coronary artery disease [anginal equiv - CP/SOB] S/p DES to R PDA in 10/19 Myoview 8/21: no ischemia; low risk S/p DES to OM1 in 9/21 Cath 4/22: PDA and OM1 stents patent; mLAD 50-70, Dx 71 - Med Rx (HFpEF) heart failure with preserved ejection fraction  Echo 5/21: EF 60-65 mild LVH, Gr 1 DD, RVSP 31.1, AV sclerosis without stenosis Echo 7/22: EF 60-65, GR 1 DD Hypertension  Hyperlipidemia  Supraventricular Tachycardia Monitor 5/22: 19 Supraventricular Tachycardia episodes (longest 2"22'; fastest 5 beats HR 158) PVCs  Monitor 5/22: 3.2% burden  EP eval 11/22 - Dr. Lovena Le; beta-blocker ? to Amiodarone  GERD Diabetes mellitus  Hypothyroidism  Prior CV Studies: ECHO COMPLETE WO IMAGING ENHANCING AGENT 12/05/2020 EF 60-65, no RWMA, GR 1 DD, normal RVSF, mildly elevated PASP, mild LAE, mild AV sclerosis without stenosis, RA pressure 3   LONG TERM MONITOR (8-14 DAYS) INTERPRETATION 10/13/2020 Sinus rhythm with lowest heart rate 44 beats per minute and maximum heart rate 158 beats per minutes. 19 Supraventricular Tachycardia runs occurred, the run with the fastest interval lasting 5 beats with a max rate of 158 bpm, the longest lasting 2 mins 22 secs with an avg rate of 111 bpm. Junctional rhythm is present. Rare premature atrial contractions Occasional premature ventricular contractions  (3.2%, 43231), VE Couplets were rare (<1.0%, 63), and VE Triplets were rare (<1.0%, 6). Ventricular Bigeminy and Trigeminy were present.    RIGHT/LEFT HEART CATH AND CORONARY ANGIOGRAPHY 09/06/2020 Narrative  Right dominant with diffuse mild luminal irregularities.  Patent stent proximal PDA.   PDA is small.  Widely patent left main.  LAD reaches the left ventricular apex.  The mid vessel contains a 50 to 70% stenosis and forms a [place] 011 bifurcation stenosis with the diagonal.  Diagonal contains 50% stenosis.  The LAD is unchanged compared to prior imaging in 2021.  Circumflex has moderate diffuse disease.  The stent in the small first obtuse marginal is widely patent.  Normal LV function.  LVEDP 12 mmHg.  EF 55%.  Normal right heart pressures. RECOMMENDATIONS: Med Rx    RIGHT/LEFT HEART CATH 02/18/2020 RCA proximal-mid 20; RPDA stent patent LCx ostial-proximal 30; OM1 90, OM2 30 LAD proximal to mid 50, distal 20 PCI:  SYNERGY XD 2.25X12 to the OM1    GATED SPECT MYO PERF W/EXERCISE STRESS 1D 12/29/2019 Normal stress nuclear study with inferior thinning but no ischemia.  Gated ejection fraction 65% with normal wall motion.   ECHOCARDIOGRAM 10/12/19 EF 60-65, no RWMA, mild LVH, GR 1 DD, normal RVSF, RVSP 31.1, AV sclerosis without stenosis   LONG TERM MONITOR (3-7 DAYS) INTERPRETATION 02/23/2019 Sinus rhythm 20 episodes of supraventricular tachycardia. The longest episode lasted 1 minute, 11 seconds. Most of the episodes were only for a few seconds. Rare premature ventricular contractions  History of Present Illness:   Peter Brooks is a 76 y.o. male with the above problem list.  He was last seen by Dr. Angelena Form in 2/23.  He returns for evaluation of lower extremity swelling.  He notes significant worsening in leg swelling throughout the day over the past couple of months.  This does improve with elevation.  He recently saw podiatry for Achilles tendinitis and a bone spur in his foot.  He was asked to follow-up with cardiology given the extent of his swelling.  He has noted some shortness of breath with exertion.  He uses CPAP at night.  He was having nocturnal hypoxia and was placed on O2.  He has not had orthopnea, exertional chest pain or syncope.  His weight is down  about 4 pounds.  He has not had further palpitations since starting on amiodarone.     Past Medical History:  Diagnosis Date   Anxiety    Arthritis    CAD (coronary artery disease)    LHC 02/18/2020: prior stent to RPDA patent, 90% stenosis of OM1 s/p PTCA/DES, 50% 1st Diag, 50% proximal to mid LAD, 20% distal LAD, 30% ostial to proximal CX, 30% OM2.   Cataract    removed bilaterally    Chest pain    Diabetes mellitus without complication ([facility])    Dizziness    GERD (gastroesophageal reflux disease)    Hyperlipidemia    Hypertension    Hypothyroidism    Lumbar herniated disc    Current Medications: Current Meds  Medication Sig   amiodarone (PACERONE) 200 MG tablet Take 1 tablet (200 mg total) by mouth daily.   amLODipine (NORVASC) 5 MG tablet Take 1 tablet (5 mg total) by mouth daily.   aspirin EC 81 MG tablet Take 81 mg by mouth every evening.    Blood Glucose Monitoring Suppl (ONE TOUCH ULTRA 2) w/Device KIT See admin instructions.   diazepam (VALIUM) 2 MG tablet Take 1 tablet (2 mg total) by mouth every 12 (twelve) hours as needed for anxiety.   furosemide (LASIX) 40 MG tablet Take 1 tablet by mouth twice a day for 3 days then go back down to 1 tablet by mouth daily   levothyroxine (SYNTHROID, LEVOTHROID) 50 MCG tablet Take 50 mcg by mouth daily.   Multiple Vitamin (MULITIVITAMIN WITH MINERALS) TABS Take 1 tablet by mouth daily.   Multiple Vitamins-Minerals (PRESERVISION AREDS 2) CAPS Take 1 capsule by mouth 2 (two) times daily.    nitroGLYCERIN (NITROSTAT) 0.4 MG SL tablet Place 1 tablet (0.4 mg total) under the tongue every 5 (five) minutes as needed.   ONETOUCH ULTRA test strip USE 1 STRIP TO CHECK GLUCOSE ONCE DAILY   potassium chloride SA (KLOR-CON M) 20 MEQ tablet Take 1 tablet by mouth twice daily   Probiotic Product (PROBIOTIC DAILY PO) Take 1 tablet by mouth daily.   rosuvastatin (CRESTOR) 10 MG tablet Take 10 mg by mouth every evening.   [DISCONTINUED] amLODipine  (NORVASC) 10 MG tablet Take 1 tablet (10 mg total) by mouth daily.   [DISCONTINUED] furosemide (LASIX) 40 MG tablet TAKE 1 TABLET BY MOUTH ONCE DAILY. MAY TAKE 1 EXTRA TABLET DAILY AS NEEDED FOR WEIGHT GAIN OR SWELLING    Allergies:   Cefdinir   Social History   Tobacco Use   Smoking status: Never   Smokeless tobacco: Never  Vaping Use   Vaping Use: Never used  Substance Use Topics   Alcohol use: No   Drug use: No    Family Hx: The patient's family history includes Colon cancer (age of onset: 49) in his paternal uncle; Coronary artery disease in his father; Heart attack in his mother; Stomach cancer in his paternal grandmother. There is no history of Esophageal cancer, Rectal cancer, or Colon polyps.  Review of Systems  Respiratory:  Negative for cough and wheezing.  Gastrointestinal:  Positive for constipation.     EKGs/Labs/Other Test Reviewed:    EKG:  EKG is not ordered today.    Recent Labs: 07/18/2021: ALT 23 02/21/2022: BUN 13; Creatinine, Ser 1.16; Hemoglobin WILL FOLLOW; NT-Pro BNP WILL FOLLOW; Platelets WILL FOLLOW; Potassium 4.2; Sodium 138   Recent Lipid Panel Recent Labs    07/18/21 1032  CHOL 106  TRIG 132  HDL 37*  LDLCALC 46     Risk Assessment/Calculations/Metrics:              Physical Exam:    VS:  BP 130/68   Pulse 78   Ht _0  (1.88 m)   Wt 264 lb 3.2 oz (119.8 kg)   SpO2 98%   BMI 33.92 kg/m     Wt Readings from Last 3 Encounters:  02/21/22 264 lb 3.2 oz (119.8 kg)  07/18/21 268 lb (121.6 kg)  06/13/21 268 lb (121.6 kg)    Constitutional:      Appearance: Healthy appearance. Not in distress.  Neck:     Vascular: No JVR. JVD normal.  Pulmonary:     Effort: Pulmonary effort is normal.     Breath sounds: No wheezing. No rales.  Cardiovascular:     Normal rate. Regular rhythm. Normal S1. Normal S2.      Murmurs: There is no murmur.  Edema:    Peripheral edema present.    Pretibial: bilateral 1+ edema of the pretibial area.     Ankle: bilateral 2+ edema of the ankle.    Feet: bilateral 2+ edema of the feet. Abdominal:     Palpations: Abdomen is soft.  Skin:    General: Skin is warm and dry.  Neurological:     General: No focal deficit present.     Mental Status: Alert and oriented to person, place and time.         ASSESSMENT & PLAN:   (HFpEF) heart failure with preserved ejection fraction ([place]) He presents with worsening lower extremity edema and exertional shortness of breath over the past couple of months.  His weight is actually down.  His lungs are clear on exam.  He is somewhat concerned about amiodarone toxicity.  He does have significant improvement with elevation.  I suspect he has multifactorial edema due to venous insufficiency, heart failure and possibly amlodipine.  Obtain BMET, BNP, CBC today.  Increase furosemide to 40 mg twice daily for 3 days then reduce to 40 mg daily.  Reduce dose of amlodipine to 5 mg daily.  Obtain chest x-ray to screen for the possibility of pulmonary toxicity.  Follow-up in 3 to 4 weeks.  Essential hypertension Blood pressure is well controlled.  As noted, I will reduce his dose of amlodipine to 5 mg daily to see if this helps his swelling.  If his blood pressure starts to increase, we can consider placing him on an ARB versus Entresto as he does have history of HFpEF.  CAD (coronary artery disease) History of DES to the RPDA in 2019 and DES to the [place] in 2021.  Cardiac catheterization in April 2022 demonstrated patent stents and moderate disease in the LAD which is treated medically.  He is not having anginal symptoms.  Continue aspirin 81 mg daily, Crestor 10 mg daily.  PVC's (premature ventricular contractions) Palpitations overall controlled on amiodarone.  ALT in August 2023 was 22.  TSH in June 2023 was 3.417  Hyperlipidemia LDL 46 in February 2023.  Lipids optimal.  Continue Crestor 10  mg daily.           Dispo:  Return in about 4 weeks (around 03/21/2022) for  Routine Follow Up with Dr. Angelena Form, or Richardson Dopp, PA-C.   Medication Adjustments/Labs and Tests Ordered: Current medicines are reviewed at length with the patient today.  Concerns regarding medicines are outlined above.  Tests Ordered: Orders Placed This Encounter  Procedures   DG Chest 2 View   Basic metabolic panel   Pro b natriuretic peptide (BNP)   CBC   ECHOCARDIOGRAM COMPLETE   Medication Changes: Meds ordered this encounter  Medications   amLODipine (NORVASC) 5 MG tablet    Sig: Take 1 tablet (5 mg total) by mouth daily.    Dispense:  90 tablet    Refill:  3   furosemide (LASIX) 40 MG tablet    Sig: Take 1 tablet by mouth twice a day for 3 days then go back down to 1 tablet by mouth daily    Dispense:  90 tablet    Refill:  3   Signed, Richardson Dopp, PA-C  02/21/2022 6:32 PM    [facility] [address], [place], [place]  [address] Phone: [phone]; Fax: [phone]

## 2022-02-21 ENCOUNTER — Encounter: Payer: Self-pay | Admitting: Physician Assistant

## 2022-02-21 ENCOUNTER — Ambulatory Visit: Payer: PPO | Attending: Physician Assistant | Admitting: Physician Assistant

## 2022-02-21 ENCOUNTER — Ambulatory Visit
Admission: RE | Admit: 2022-02-21 | Discharge: 2022-02-21 | Disposition: A | Discharge status: Home / Self Care | Payer: PPO | Source: Ambulatory Visit | Attending: Physician Assistant | Admitting: Physician Assistant

## 2022-02-21 VITALS — BP 130/68 | HR 78 | Ht 74.0 in | Wt 264.2 lb

## 2022-02-21 DIAGNOSIS — E782 Mixed hyperlipidemia: Secondary | ICD-10-CM | POA: Diagnosis not present

## 2022-02-21 DIAGNOSIS — I2510 Atherosclerotic heart disease of native coronary artery without angina pectoris: Secondary | ICD-10-CM | POA: Diagnosis not present

## 2022-02-21 DIAGNOSIS — I5032 Chronic diastolic (congestive) heart failure: Secondary | ICD-10-CM | POA: Diagnosis not present

## 2022-02-21 DIAGNOSIS — I10 Essential (primary) hypertension: Secondary | ICD-10-CM | POA: Diagnosis not present

## 2022-02-21 DIAGNOSIS — I493 Ventricular premature depolarization: Secondary | ICD-10-CM | POA: Diagnosis not present

## 2022-02-21 DIAGNOSIS — R0602 Shortness of breath: Secondary | ICD-10-CM

## 2022-02-21 MED ORDER — AMLODIPINE BESYLATE 5 MG PO TABS: 5.0000 mg | ORAL_TABLET | Freq: Every day | ORAL | 3 refills | Status: DC

## 2022-02-21 MED ORDER — FUROSEMIDE 40 MG PO TABS: ORAL_TABLET | ORAL | 3 refills | Status: DC

## 2022-02-21 NOTE — Patient Instructions (Signed)
Medication Instructions:  Your physician has recommended you make the following change in your medication:   REDUCE the Amlodipine to 5 mg taking 1 daily  INCREASE the Lasix to twice a day for 3 days then go back to 1 tablet daily  *If you need a refill on your cardiac medications before your next appointment, please call your pharmacy*   Lab Work: TODAY:  BMET, PRO BNP, & CBC  If you have labs (blood work) drawn today and your tests are completely normal, you will receive your results only by: [place] (if you have MyChart) OR A paper copy in the mail If you have any lab test that is abnormal or we need to change your treatment, we will call you to review the results.   Testing/Procedures: A chest x-ray takes a picture of the organs and structures inside the chest, including the heart, lungs, and blood vessels. This test can show several things, including, whether the heart is enlarges; whether fluid is building up in the lungs; and whether pacemaker / defibrillator leads are still in place. Go to [place], you don't need an appointment.  [address]  [place], [place] [address]    Your physician has requested that you have an echocardiogram. Echocardiography is a painless test that uses sound waves to create images of your heart. It provides your doctor with information about the size and shape of your heart and how well your heart's chambers and valves are working. This procedure takes approximately one hour. There are no restrictions for this procedure.    Follow-Up: At [facility], you and your health needs are our priority.  As part of our continuing mission to provide you with exceptional heart care, we have created designated Provider Care Teams.  These Care Teams include your primary Cardiologist (physician) and Advanced Practice Providers (APPs -  Physician Assistants and Nurse Practitioners) who all work together to provide you with the care  you need, when you need it.  We recommend signing up for the patient portal called "MyChart".  Sign up information is provided on this After Visit Summary.  MyChart is used to connect with patients for Virtual Visits (Telemedicine).  Patients are able to view lab/test results, encounter notes, upcoming appointments, etc.  Non-urgent messages can be sent to your provider as well.   To learn more about what you can do with MyChart, go to [email or web address].    Your next appointment:   3-4 WEEKS   The format for your next appointment:   In Person  Provider:   Lauree Chandler, MD  or Richardson Dopp, PA-C         Other Instructions Get some knee high compression stockings   Call us if your blood pressure runs higher than 140/90 for 3 days   Important Information About Sugar

## 2022-02-21 NOTE — Assessment & Plan Note (Signed)
Blood pressure is well controlled.  As noted, I will reduce his dose of amlodipine to 5 mg daily to see if this helps his swelling.  If his blood pressure starts to increase, we can consider placing him on an ARB versus Entresto as he does have history of HFpEF.

## 2022-02-21 NOTE — Assessment & Plan Note (Signed)
LDL 46 in February 2023.  Lipids optimal.  Continue Crestor 10 mg daily.

## 2022-02-21 NOTE — Assessment & Plan Note (Signed)
History of DES to the RPDA in 2019 and DES to the [place] in 2021.  Cardiac catheterization in April 2022 demonstrated patent stents and moderate disease in the LAD which is treated medically.  He is not having anginal symptoms.  Continue aspirin 81 mg daily, Crestor 10 mg daily.

## 2022-02-21 NOTE — Assessment & Plan Note (Signed)
He presents with worsening lower extremity edema and exertional shortness of breath over the past couple of months.  His weight is actually down.  His lungs are clear on exam.  He is somewhat concerned about amiodarone toxicity.  He does have significant improvement with elevation.  I suspect he has multifactorial edema due to venous insufficiency, heart failure and possibly amlodipine.  Obtain BMET, BNP, CBC today.  Increase furosemide to 40 mg twice daily for 3 days then reduce to 40 mg daily.  Reduce dose of amlodipine to 5 mg daily.  Obtain chest x-ray to screen for the possibility of pulmonary toxicity.  Follow-up in 3 to 4 weeks.

## 2022-02-21 NOTE — Assessment & Plan Note (Signed)
Palpitations overall controlled on amiodarone.  ALT in August 2023 was 22.  TSH in June 2023 was 3.417

## 2022-02-22 LAB — CBC
Hematocrit: 46 % (ref 37.5–51.0)
Hemoglobin: 15 g/dL (ref 13.0–17.7)
MCH: 29 pg (ref 26.6–33.0)
MCHC: 32.6 g/dL (ref 31.5–35.7)
MCV: 89 fL (ref 79–97)
Platelets: 314 10*3/uL (ref 150–450)
RBC: 5.18 x10E6/uL (ref 4.14–5.80)
RDW: 13.6 % (ref 11.6–15.4)
WBC: 7.2 10*3/uL (ref 3.4–10.8)

## 2022-02-22 LAB — BASIC METABOLIC PANEL
BUN/Creatinine Ratio: 11 (ref 10–24)
BUN: 13 mg/dL (ref 8–27)
CO2: 23 mmol/L (ref 20–29)
Calcium: 9.8 mg/dL (ref 8.6–10.2)
Chloride: 100 mmol/L (ref 96–106)
Creatinine, Ser: 1.16 mg/dL (ref 0.76–1.27)
Glucose: 138 mg/dL — ABNORMAL HIGH (ref 70–99)
Potassium: 4.2 mmol/L (ref 3.5–5.2)
Sodium: 138 mmol/L (ref 134–144)
eGFR: 65 mL/min/{1.73_m2} (ref >59)

## 2022-02-22 LAB — PRO B NATRIURETIC PEPTIDE: NT-Pro BNP: 88 pg/mL (ref 0–486)

## 2022-02-23 ENCOUNTER — Telehealth: Payer: Self-pay

## 2022-02-23 DIAGNOSIS — R918 Other nonspecific abnormal finding of lung field: Secondary | ICD-10-CM

## 2022-02-23 DIAGNOSIS — R9389 Abnormal findings on diagnostic imaging of other specified body structures: Secondary | ICD-10-CM

## 2022-02-23 NOTE — Telephone Encounter (Signed)
-----   Message from Liliane Shi, [place] sent at 02/23/2022  2:32 PM EDT ----- CXR with?patchy opacity L lung base. Pt was seen for leg edema. He did report some shortness of breath. He did not report cough or fevers. So, I doubt this represents pneumonia. Cefdinir is listed as a true allergy. He is on amiodarone. Would like to avoid fluoroquinolones if possible.  PLAN:  -Ask pt if he has ever taken penicillin. -Ask pt if he has had any recent cough, wheezing, fever, chills. -Ask pt if breathing any better with extra Lasix. -Repeat CXR in 1 week to recheck L lung base finding (Dx: abnormal CXR) Richardson Dopp, PA-C    02/23/2022 2:26 PM

## 2022-02-23 NOTE — Telephone Encounter (Signed)
Spoke with pt and advised of CXR result and recommendation per Richardson Dopp, PA-C as below.  Pt denies cough, wheezing, fever or chills at this time.  He reports SOB is not improved with additional Lasix.  He has taken PCN many years ago and states he does not have a known allergy.  Pt is agreeable to return in one week for repeat CXR.  Order placed.  Pt advised will contact for any further recommendations from PA-C.

## 2022-02-25 NOTE — Telephone Encounter (Signed)
Let's get CXR on 10/18 to compare to this CXR. If pt develops cough, fevers, chills, flu like symptoms he should call or see PCP. Richardson Dopp, PA-C    02/25/2022 9:47 PM

## 2022-02-26 NOTE — Telephone Encounter (Signed)
Patient is returning call. He states he has been having issues with his cell phone and requested that a detailed message be left on his home phone if at all possible.

## 2022-02-26 NOTE — Telephone Encounter (Signed)
Detailed message left on the pts voicemail per the pts request with Trinidad Curet recommendations... order placed for his repeat CXR.

## 2022-02-26 NOTE — Telephone Encounter (Signed)
Left message for patient to call back  

## 2022-02-27 NOTE — Telephone Encounter (Signed)
Pt called back today to let Nicki Reaper know that his fluid has come off with the Lasix... his breathing has improved and his edema is better... his weight is down from 264 in the office 02/23/22 to 250 lbs today.   He will continue to monitor and let us know if he develop any worrisome symptoms..   I will forward to [facility] for his review.   Pt to have Chest xray on 03/07/22 the same day as his Echo 03/07/22.   Follow up 03/20/22.

## 2022-02-27 NOTE — Telephone Encounter (Signed)
I'm glad he is feeling better.  Richardson Dopp, PA-C    02/27/2022 5:27 PM

## 2022-03-01 DIAGNOSIS — G4733 Obstructive sleep apnea (adult) (pediatric): Secondary | ICD-10-CM | POA: Diagnosis not present

## 2022-03-01 DIAGNOSIS — E785 Hyperlipidemia, unspecified: Secondary | ICD-10-CM | POA: Diagnosis not present

## 2022-03-01 DIAGNOSIS — G4734 Idiopathic sleep related nonobstructive alveolar hypoventilation: Secondary | ICD-10-CM | POA: Diagnosis not present

## 2022-03-01 DIAGNOSIS — G720 Drug-induced myopathy: Secondary | ICD-10-CM | POA: Diagnosis not present

## 2022-03-01 DIAGNOSIS — T466X5A Adverse effect of antihyperlipidemic and antiarteriosclerotic drugs, initial encounter: Secondary | ICD-10-CM | POA: Diagnosis not present

## 2022-03-01 DIAGNOSIS — Z6834 Body mass index (BMI) 34.0-34.9, adult: Secondary | ICD-10-CM | POA: Diagnosis not present

## 2022-03-06 DIAGNOSIS — Z23 Encounter for immunization: Secondary | ICD-10-CM | POA: Diagnosis not present

## 2022-03-07 ENCOUNTER — Ambulatory Visit
Admission: RE | Admit: 2022-03-07 | Discharge: 2022-03-07 | Disposition: A | Discharge status: Home / Self Care | Payer: PPO | Source: Ambulatory Visit | Attending: Physician Assistant | Admitting: Physician Assistant

## 2022-03-07 ENCOUNTER — Ambulatory Visit (HOSPITAL_COMMUNITY): Payer: PPO | Attending: Internal Medicine

## 2022-03-07 DIAGNOSIS — I5032 Chronic diastolic (congestive) heart failure: Secondary | ICD-10-CM | POA: Diagnosis not present

## 2022-03-07 DIAGNOSIS — R0602 Shortness of breath: Secondary | ICD-10-CM | POA: Diagnosis not present

## 2022-03-07 DIAGNOSIS — R918 Other nonspecific abnormal finding of lung field: Secondary | ICD-10-CM

## 2022-03-07 DIAGNOSIS — I493 Ventricular premature depolarization: Secondary | ICD-10-CM | POA: Insufficient documentation

## 2022-03-07 DIAGNOSIS — R9389 Abnormal findings on diagnostic imaging of other specified body structures: Secondary | ICD-10-CM

## 2022-03-07 DIAGNOSIS — I2510 Atherosclerotic heart disease of native coronary artery without angina pectoris: Secondary | ICD-10-CM | POA: Insufficient documentation

## 2022-03-07 DIAGNOSIS — J439 Emphysema, unspecified: Secondary | ICD-10-CM | POA: Diagnosis not present

## 2022-03-07 DIAGNOSIS — J449 Chronic obstructive pulmonary disease, unspecified: Secondary | ICD-10-CM | POA: Diagnosis not present

## 2022-03-07 DIAGNOSIS — J410 Simple chronic bronchitis: Secondary | ICD-10-CM | POA: Diagnosis not present

## 2022-03-07 DIAGNOSIS — J430 Unilateral pulmonary emphysema [MacLeod's syndrome]: Secondary | ICD-10-CM | POA: Diagnosis not present

## 2022-03-07 LAB — ECHOCARDIOGRAM COMPLETE
Area-P 1/2: 2.95 cm2
P 1/2 time: 481 msec
S' Lateral: 3.9 cm

## 2022-03-08 ENCOUNTER — Encounter: Payer: Self-pay | Admitting: Physician Assistant

## 2022-03-11 DIAGNOSIS — G4733 Obstructive sleep apnea (adult) (pediatric): Secondary | ICD-10-CM | POA: Diagnosis not present

## 2022-03-12 NOTE — Progress Notes (Signed)
Pt has been made aware of normal result and verbalized understanding.  jw

## 2022-03-14 ENCOUNTER — Ambulatory Visit: Payer: PPO | Admitting: Physician Assistant

## 2022-03-20 ENCOUNTER — Encounter: Payer: Self-pay | Admitting: Physician Assistant

## 2022-03-20 ENCOUNTER — Ambulatory Visit: Payer: PPO | Attending: Physician Assistant | Admitting: Physician Assistant

## 2022-03-20 VITALS — BP 110/62 | HR 76 | Ht 74.0 in | Wt 247.6 lb

## 2022-03-20 DIAGNOSIS — I2510 Atherosclerotic heart disease of native coronary artery without angina pectoris: Secondary | ICD-10-CM | POA: Diagnosis not present

## 2022-03-20 DIAGNOSIS — I10 Essential (primary) hypertension: Secondary | ICD-10-CM

## 2022-03-20 DIAGNOSIS — I5032 Chronic diastolic (congestive) heart failure: Secondary | ICD-10-CM | POA: Diagnosis not present

## 2022-03-20 DIAGNOSIS — I493 Ventricular premature depolarization: Secondary | ICD-10-CM

## 2022-03-20 MED ORDER — AMIODARONE HCL 200 MG PO TABS: ORAL_TABLET | ORAL | 3 refills | Status: DC

## 2022-03-20 NOTE — Assessment & Plan Note (Signed)
ALT in August 2023 was 22.  TSH in June 2023 was 3.417  He is overall stable and is not having symptoms.  He would like to reduce the dose of amiodarone if possible.  I think it is reasonable to cut back on his amiodarone to 200 mg daily Monday through Friday and 100 mg on Saturdays and Sundays.

## 2022-03-20 NOTE — Assessment & Plan Note (Signed)
Blood pressure is controlled.  Continue current dose of amlodipine.

## 2022-03-20 NOTE — Patient Instructions (Signed)
Medication Instructions:  Your physician has recommended you make the following change in your medication:   CHANGE the Amiodarone to 1 tablet daily Monday - Friday and 1/2 tablet on Saturdays & Sundays   *If you need a refill on your cardiac medications before your next appointment, please call your pharmacy*   Lab Work: None ordered  If you have labs (blood work) drawn today and your tests are completely normal, you will receive your results only by: [place] (if you have MyChart) OR A paper copy in the mail If you have any lab test that is abnormal or we need to change your treatment, we will call you to review the results.   Testing/Procedures: None ordered   Follow-Up: At [facility], you and your health needs are our priority.  As part of our continuing mission to provide you with exceptional heart care, we have created designated Provider Care Teams.  These Care Teams include your primary Cardiologist (physician) and Advanced Practice Providers (APPs -  Physician Assistants and Nurse Practitioners) who all work together to provide you with the care you need, when you need it.  We recommend signing up for the patient portal called "MyChart".  Sign up information is provided on this After Visit Summary.  MyChart is used to connect with patients for Virtual Visits (Telemedicine).  Patients are able to view lab/test results, encounter notes, upcoming appointments, etc.  Non-urgent messages can be sent to your provider as well.   To learn more about what you can do with MyChart, go to [email or web address].    Your next appointment:   As scheduled   The format for your next appointment:   In Person  Provider:   Lauree Chandler, MD     Other Instructions   Important Information About Sugar

## 2022-03-20 NOTE — Progress Notes (Addendum)
Cardiology Office Note:    Date:  03/20/2022   ID:  Peter Brooks, DOB 25-Sep-1945, MRN [id number]  PCP:  Street, Sharon Mt, MD  [place] [facility] Cardiologist:  Lauree Chandler, MD    Referring MD: Street, Sharon Mt, *   Chief Complaint:  F/u on CHF    Patient Profile: Coronary artery disease [anginal equiv - CP/SOB] S/p DES to R PDA in 10/19 Myoview 8/21: no ischemia; low risk S/p 2.25 x 12 mm DES to OM1 in 9/21 Cath 4/22: PDA and OM1 stents patent; mLAD 50-70, Dx 41 - Med Rx (HFpEF) heart failure with preserved ejection fraction  Echo 5/21: EF 60-65 mild LVH, Gr 1 DD, RVSP 31.1, AV sclerosis without stenosis Echo 7/22: EF 60-65, GR 1 DD Hypertension  Hyperlipidemia  Supraventricular Tachycardia Monitor 5/22: 19 Supraventricular Tachycardia episodes (longest 2"22'; fastest 5 beats HR 158) PVCs  Monitor 5/22: 3.2% burden  EP eval 11/22 - Dr. Lovena Le; beta-blocker ? to Amiodarone  GERD Diabetes mellitus  Hypothyroidism  Prior CV Studies: ECHO COMPLETE WO IMAGING ENHANCING AGENT 03/07/2022 EF 60-65, no RWMA, normal RVSF, mild LAE, trivial MR, mild AI, RAP 8, RVSP 35.2  ECHO COMPLETE WO IMAGING ENHANCING AGENT 12/05/2020 EF 60-65, no RWMA, GR 1 DD, normal RVSF, mildly elevated PASP, mild LAE, mild AV sclerosis without stenosis, RA pressure 3   LONG TERM MONITOR (8-14 DAYS) INTERPRETATION 10/13/2020 Sinus rhythm with lowest heart rate 44 beats per minute and maximum heart rate 158 beats per minutes. 19 Supraventricular Tachycardia runs occurred, the run with the fastest interval lasting 5 beats with a max rate of 158 bpm, the longest lasting 2 mins 22 secs with an avg rate of 111 bpm. Junctional rhythm is present. Rare premature atrial contractions Occasional premature ventricular contractions  (3.2%, 43231), VE Couplets were rare (<1.0%, 63), and VE Triplets were rare (<1.0%, 6). Ventricular Bigeminy and Trigeminy were present.    RIGHT/LEFT  HEART CATH AND CORONARY ANGIOGRAPHY 09/06/2020 Narrative  Right dominant with diffuse mild luminal irregularities.  Patent stent proximal PDA.  PDA is small.  Widely patent left main.  LAD reaches the left ventricular apex.  The mid vessel contains a 50 to 70% stenosis and forms a [place] 011 bifurcation stenosis with the diagonal.  Diagonal contains 50% stenosis.  The LAD is unchanged compared to prior imaging in 2021.  Circumflex has moderate diffuse disease.  The stent in the small first obtuse marginal is widely patent.  Normal LV function.  LVEDP 12 mmHg.  EF 55%.  Normal right heart pressures. RECOMMENDATIONS: Med Rx    RIGHT/LEFT HEART CATH 02/18/2020 RCA proximal-mid 20; RPDA stent patent LCx ostial-proximal 30; OM1 90, OM2 30 LAD proximal to mid 50, distal 20 PCI:  SYNERGY XD 2.25X12 to the OM1    GATED SPECT MYO PERF W/EXERCISE STRESS 1D 12/29/2019 Normal stress nuclear study with inferior thinning but no ischemia.  Gated ejection fraction 65% with normal wall motion.   ECHOCARDIOGRAM 10/12/19 EF 60-65, no RWMA, mild LVH, GR 1 DD, normal RVSF, RVSP 31.1, AV sclerosis without stenosis   LONG TERM MONITOR (3-7 DAYS) INTERPRETATION 02/23/2019 Sinus rhythm 20 episodes of supraventricular tachycardia. The longest episode lasted 1 minute, 11 seconds. Most of the episodes were only for a few seconds. Rare premature ventricular contractions  History of Present Illness:   Peter Brooks is a 76 y.o. male with the above problem list.  He was last seen 02/21/2022 for symptoms of lower extremity edema and shortness  of breath.  I increased his dose of furosemide for 3 days and reduced his dose of amlodipine.  A NT proBNP was normal at 88.  Creatinine, potassium, hemoglobin were normal.  An echocardiogram demonstrated normal EF and no significant valve disease.  Chest x-ray demonstrated mild patchy opacity in left lung base.  He had no symptoms of pneumonia.  A repeat chest x-ray was  normal.    He returns for follow-up.  He is here with his wife.  He has been dieting and has lost about 18 pounds since he was here last.  His lower extremity swelling has resolved.  He is feeling much better.  He has not had any further shortness of breath.  He has not had chest pain.  He has not had any palpitations.  He would like to reduce the dose of his amiodarone if possible.     Past Medical History:  Diagnosis Date   (HFpEF) heart failure with preserved ejection fraction ([place]) 02/20/2022   Echo 02/2022: EF 60-65, no RWMA, mild RVE, normal PASP (RVSP 35.2), mild LAE, trivial MR, AV sclerosis without stenosis, mild AI, RAP 8   Anxiety    Arthritis    CAD (coronary artery disease)    LHC 02/18/2020: prior stent to RPDA patent, 90% stenosis of OM1 s/p PTCA/DES, 50% 1st Diag, 50% proximal to mid LAD, 20% distal LAD, 30% ostial to proximal CX, 30% OM2.   Cataract    removed bilaterally    Chest pain    Diabetes mellitus without complication (HCC)    Dizziness    GERD (gastroesophageal reflux disease)    Hyperlipidemia    Hypertension    Hypothyroidism    Lumbar herniated disc    Current Medications: Current Meds  Medication Sig   amiodarone (PACERONE) 200 MG tablet Take 1 tablet by mouth Monday - Friday, take 1/2 tablet by mouth on Saturdays & Sundays   amLODipine (NORVASC) 5 MG tablet Take 1 tablet (5 mg total) by mouth daily.   aspirin EC 81 MG tablet Take 81 mg by mouth every evening.    Blood Glucose Monitoring Suppl (ONE TOUCH ULTRA 2) w/Device KIT See admin instructions.   diazepam (VALIUM) 2 MG tablet Take 1 tablet (2 mg total) by mouth every 12 (twelve) hours as needed for anxiety.   diclofenac Sodium (VOLTAREN) 1 % GEL Apply 1 Application topically daily at 12 noon.   FARXIGA 10 MG TABS tablet Take 10 mg by mouth daily.   furosemide (LASIX) 40 MG tablet Take 1 tablet by mouth twice a day for 3 days then go back down to 1 tablet by mouth daily   levothyroxine (SYNTHROID,  LEVOTHROID) 50 MCG tablet Take 50 mcg by mouth daily.   Multiple Vitamin (MULITIVITAMIN WITH MINERALS) TABS Take 1 tablet by mouth daily.   Multiple Vitamins-Minerals (PRESERVISION AREDS 2) CAPS Take 1 capsule by mouth 2 (two) times daily.    nitroGLYCERIN (NITROSTAT) 0.4 MG SL tablet Place 1 tablet (0.4 mg total) under the tongue every 5 (five) minutes as needed.   ONETOUCH ULTRA test strip USE 1 STRIP TO CHECK GLUCOSE ONCE DAILY   potassium chloride SA (KLOR-CON M) 20 MEQ tablet Take 1 tablet by mouth twice daily   Probiotic Product (PROBIOTIC DAILY PO) Take 1 tablet by mouth daily.   rosuvastatin (CRESTOR) 10 MG tablet Take 10 mg by mouth 3 (three) times a week.   [DISCONTINUED] amiodarone (PACERONE) 200 MG tablet Take 1 tablet (200 mg total) by  mouth daily.    Allergies:   Cefdinir   Social History   Tobacco Use   Smoking status: Never   Smokeless tobacco: Never  Vaping Use   Vaping Use: Never used  Substance Use Topics   Alcohol use: No   Drug use: No    Family Hx: The patient's family history includes Colon cancer (age of onset: 45) in his paternal uncle; Coronary artery disease in his father; Heart attack in his mother; Stomach cancer in his paternal grandmother. There is no history of Esophageal cancer, Rectal cancer, or Colon polyps.  ROS   EKGs/Labs/Other Test Reviewed:    EKG:  EKG is not ordered today.    Recent Labs: 07/18/2021: ALT 23 02/21/2022: BUN 13; Creatinine, Ser 1.16; Hemoglobin 15.0; NT-Pro BNP 88; Platelets 314; Potassium 4.2; Sodium 138   Recent Lipid Panel Recent Labs    07/18/21 1032  CHOL 106  TRIG 132  HDL 37*  LDLCALC 46     Risk Assessment/Calculations/Metrics:              Physical Exam:    VS:  BP 110/62   Pulse 76   Ht _0  (1.88 m)   Wt 247 lb 9.6 oz (112.3 kg)   SpO2 98%   BMI 31.79 kg/m     Wt Readings from Last 3 Encounters:  03/20/22 247 lb 9.6 oz (112.3 kg)  02/21/22 264 lb 3.2 oz (119.8 kg)  07/18/21 268 lb (121.6  kg)    Constitutional:      Appearance: Healthy appearance. Not in distress.  Pulmonary:     Breath sounds: No wheezing. No rales.  Cardiovascular:     Normal rate. Regular rhythm. Normal S1. Normal S2.      Murmurs: There is no murmur.  Edema:    Peripheral edema absent.  Abdominal:     Palpations: Abdomen is soft.  Musculoskeletal:     Cervical back: Neck supple. Skin:    General: Skin is warm and dry.  Neurological:     General: No focal deficit present.     Mental Status: Alert and oriented to person, place and time.         ASSESSMENT & PLAN:   (HFpEF) heart failure with preserved ejection fraction (HCC) Volume status is improved/stable.  He has been dieting and has lost about 17 pounds since he was last here.  He feels much better.  No further testing or medication adjustments are necessary.  Continue current dose of furosemide, Farxiga.  CAD (coronary artery disease) History of DES to the RPDA in 2019 and DES to the OM1 in 2021.  Cardiac catheterization in April 2022 demonstrated patent stents and moderate disease in the LAD which is treated medically.  He is doing well without angina.  Continue current dose of aspirin, Crestor.  Essential hypertension Blood pressure is controlled.  Continue current dose of amlodipine.  PVC's (premature ventricular contractions) ALT in August 2023 was 22.  TSH in June 2023 was 3.417  He is overall stable and is not having symptoms.  He would like to reduce the dose of amiodarone if possible.  I think it is reasonable to cut back on his amiodarone to 200 mg daily Monday through Friday and 100 mg on Saturdays and Sundays.          Dispo:  Return in 3 months (on 06/18/2022) for Scheduled Follow Up with Dr. Angelena Form.   Medication Adjustments/Labs and Tests Ordered: Current medicines are reviewed at length  with the patient today.  Concerns regarding medicines are outlined above.  Tests Ordered: No orders of the defined types were placed in  this encounter.  Medication Changes: Meds ordered this encounter  Medications   amiodarone (PACERONE) 200 MG tablet    Sig: Take 1 tablet by mouth Monday - Friday, take 1/2 tablet by mouth on Saturdays & Sundays    Dispense:  90 tablet    Refill:  3   Signed, Richardson Dopp, PA-C  03/20/2022 [address], [place], [place]  [address] Phone: [phone]; Fax: [phone]

## 2022-03-20 NOTE — Assessment & Plan Note (Signed)
History of DES to the RPDA in 2019 and DES to the [place] in 2021.  Cardiac catheterization in April 2022 demonstrated patent stents and moderate disease in the LAD which is treated medically.  He is doing well without angina.  Continue current dose of aspirin, Crestor.

## 2022-03-20 NOTE — Assessment & Plan Note (Addendum)
Volume status is improved/stable.  He has been dieting and has lost about 17 pounds since he was last here.  He feels much better.  No further testing or medication adjustments are necessary.  Continue current dose of furosemide, Farxiga.

## 2022-04-11 DIAGNOSIS — G4733 Obstructive sleep apnea (adult) (pediatric): Secondary | ICD-10-CM | POA: Diagnosis not present

## 2022-04-17 DIAGNOSIS — G4733 Obstructive sleep apnea (adult) (pediatric): Secondary | ICD-10-CM | POA: Diagnosis not present

## 2022-05-11 DIAGNOSIS — G4733 Obstructive sleep apnea (adult) (pediatric): Secondary | ICD-10-CM | POA: Diagnosis not present

## 2022-05-21 DIAGNOSIS — G4733 Obstructive sleep apnea (adult) (pediatric): Secondary | ICD-10-CM | POA: Diagnosis not present

## 2022-05-30 DIAGNOSIS — E785 Hyperlipidemia, unspecified: Secondary | ICD-10-CM | POA: Diagnosis not present

## 2022-05-30 DIAGNOSIS — E1169 Type 2 diabetes mellitus with other specified complication: Secondary | ICD-10-CM | POA: Diagnosis not present

## 2022-05-30 DIAGNOSIS — R972 Elevated prostate specific antigen [PSA]: Secondary | ICD-10-CM | POA: Diagnosis not present

## 2022-05-31 DIAGNOSIS — M170 Bilateral primary osteoarthritis of knee: Secondary | ICD-10-CM | POA: Diagnosis not present

## 2022-06-11 DIAGNOSIS — G4733 Obstructive sleep apnea (adult) (pediatric): Secondary | ICD-10-CM | POA: Diagnosis not present

## 2022-06-17 NOTE — Progress Notes (Unsigned)
No chief complaint on file.   History of Present Illness: 77 yo male with history of CAD, chronic diastolic CHF, HTN, HLD, GERD and SVT here today for cardiac follow up.  Cardiac cath June 2010 with non-obstructive CAD and LVEF 60%.  He was seen in January 2015 and had c/o heart racing, dizziness, fatigue. Stress myoview 07/27/13 without ischemia. Echo 07/27/13 with normal LV function, no significant valve issues. Event monitor in 2015 without any arrythmias. He called our office in July 2019 with c/o palpitations. Cardiac monitor August 2019 with PVCs, PACs, short run of SVT and 4 beat run of non-sustained VT. Toprol was started. I saw him in the office in October 2019 and he c/o progressive dyspnea and chest pain with exertion. Echo October 2019 with normal LV systolic function. No significant valve disease. Cardiac cath 03/11/18 with severe stenosis in the right PDA treated with a drug eluting stent. Mild non-obstructive disease in the LAD and Circumflex. He was seen via telemedicine visit 01/19/19 and had c/o chest pain with exertion. Nuclear stress test 02/20/19 with no evidence of ischemia. Cardiac monitor September 2020 with sinus, several short runs of SVT. I saw him in May 2021 and he reported increased dyspnea and LE edema. Lasix was started. Echo 10/12/19 with LVEF=60-65%, no valve disease. Nuclear stress test August 2021 with no ischemia. He continued to have chest pain and dyspnea so we arranged a cardiac cath on 02/18/20. This showed a patent right PDA stent, non-obstructive LAD disease and a severe stenosis in the first obtuse marginal branch which was treated with a drug eluting stent. Right heart cath with low filling pressures, PCWP 7, LVEDP 8. Repeat cardiac cath April 2022 with stable CAD. He was seen by Richardson Dopp, PA-C in May 2022 and c/o ongoing weakness and dizziness. Norvasc was lowered. 14 day cardiac monitor with sinus with 19 runs of SVT, longest 2 minutes and 22 seconds. PVCs. He was  admitted to [facility] in July 2022 after a possible syncopal episode while sitting at home. He was seen by Cardiology. Echo with LVEF=60-65%, no significant valve disease. He was seen in the EP clinic by Dr. Lovena Le and his Toprol was stopped. Amiodarone was started for treatment of his PVCs. Echo 03/07/22 with LVEF=60-65%, mild AI.   He is here today for follow up. The patient denies any chest pain, dyspnea, palpitations, lower extremity edema, orthopnea, PND, dizziness, near syncope or syncope.    Primary Care Physician: Street, Sharon Mt, MD  Past Medical History:  Diagnosis Date   (HFpEF) heart failure with preserved ejection fraction ([place]) 02/20/2022   Echo 02/2022: EF 60-65, no RWMA, mild RVE, normal PASP (RVSP 35.2), mild LAE, trivial MR, AV sclerosis without stenosis, mild AI, RAP 8   Anxiety    Arthritis    CAD (coronary artery disease)    LHC 02/18/2020: prior stent to RPDA patent, 90% stenosis of OM1 s/p PTCA/DES, 50% 1st Diag, 50% proximal to mid LAD, 20% distal LAD, 30% ostial to proximal CX, 30% OM2.   Cataract    removed bilaterally    Chest pain    Diabetes mellitus without complication (HCC)    Dizziness    GERD (gastroesophageal reflux disease)    Hyperlipidemia    Hypertension    Hypothyroidism    Lumbar herniated disc     Past Surgical History:  Procedure Laterality Date   CARDIAC CATHETERIZATION  11/11/2008   CATARACT EXTRACTION Right    CATARACT EXTRACTION Left  COLONOSCOPY     CORONARY STENT INTERVENTION N/A 03/11/2018   Procedure: CORONARY STENT INTERVENTION;  Surgeon: Martinique, Peter M, MD;  Location: [place] [facility];  Service: Cardiovascular;  Laterality: N/A;   CORONARY STENT INTERVENTION N/A 02/18/2020   Procedure: CORONARY STENT INTERVENTION;  Surgeon: Burnell Blanks, MD;  Location: [place] [facility];  Service: Cardiovascular;  Laterality: N/A;   KNEE ARTHROSCOPY Bilateral 2010 Right   POLYPECTOMY     RIGHT/LEFT HEART CATH AND CORONARY  ANGIOGRAPHY N/A 03/11/2018   Procedure: RIGHT/LEFT HEART CATH AND CORONARY ANGIOGRAPHY;  Surgeon: Martinique, Peter M, MD;  Location: [place] [facility];  Service: Cardiovascular;  Laterality: N/A;   RIGHT/LEFT HEART CATH AND CORONARY ANGIOGRAPHY N/A 02/18/2020   Procedure: RIGHT/LEFT HEART CATH AND CORONARY ANGIOGRAPHY;  Surgeon: Burnell Blanks, MD;  Location: [place] [facility];  Service: Cardiovascular;  Laterality: N/A;   RIGHT/LEFT HEART CATH AND CORONARY ANGIOGRAPHY N/A 09/06/2020   Procedure: RIGHT/LEFT HEART CATH AND CORONARY ANGIOGRAPHY;  Surgeon: Belva Crome, MD;  Location: [place] [facility];  Service: Cardiovascular;  Laterality: N/A;    Current Outpatient Medications  Medication Sig Dispense Refill   amiodarone (PACERONE) 200 MG tablet Take 1 tablet by mouth Monday - Friday, take 1/2 tablet by mouth on Saturdays & Sundays 90 tablet 3   amLODipine (NORVASC) 5 MG tablet Take 1 tablet (5 mg total) by mouth daily. 90 tablet 3   aspirin EC 81 MG tablet Take 81 mg by mouth every evening.      Blood Glucose Monitoring Suppl (ONE TOUCH ULTRA 2) w/Device KIT See admin instructions.     diazepam (VALIUM) 2 MG tablet Take 1 tablet (2 mg total) by mouth every 12 (twelve) hours as needed for anxiety. 30 tablet 1   diclofenac Sodium (VOLTAREN) 1 % GEL Apply 1 Application topically daily at 12 noon.     FARXIGA 10 MG TABS tablet Take 10 mg by mouth daily.     furosemide (LASIX) 40 MG tablet Take 1 tablet by mouth twice a day for 3 days then go back down to 1 tablet by mouth daily 90 tablet 3   levothyroxine (SYNTHROID, LEVOTHROID) 50 MCG tablet Take 50 mcg by mouth daily.     Multiple Vitamin (MULITIVITAMIN WITH MINERALS) TABS Take 1 tablet by mouth daily.     Multiple Vitamins-Minerals (PRESERVISION AREDS 2) CAPS Take 1 capsule by mouth 2 (two) times daily.      nitroGLYCERIN (NITROSTAT) 0.4 MG SL tablet Place 1 tablet (0.4 mg total) under the tongue every 5 (five) minutes as needed. 25  tablet 3   ONETOUCH ULTRA test strip USE 1 STRIP TO CHECK GLUCOSE ONCE DAILY     potassium chloride SA (KLOR-CON M) 20 MEQ tablet Take 1 tablet by mouth twice daily 180 tablet 2   Probiotic Product (PROBIOTIC DAILY PO) Take 1 tablet by mouth daily.     rosuvastatin (CRESTOR) 10 MG tablet Take 10 mg by mouth 3 (three) times a week.     No current facility-administered medications for this visit.    Allergies  Allergen Reactions   Cefdinir Other (See Comments)    Swelling of the throat    Social History   Socioeconomic History   Marital status: Married    Spouse name: Not on file   Number of children: 3   Years of education: Not on file   Highest education level: Not on file  Occupational History   Not on file  Tobacco Use   Smoking status: Never   Smokeless tobacco: Never  Vaping Use   Vaping Use: Never used  Substance and Sexual Activity   Alcohol use: No   Drug use: No   Sexual activity: Yes    Comment: married  Other Topics Concern   Not on file  Social History Narrative   Not on file   Social Determinants of Health   Financial Resource Strain: Not on file  Food Insecurity: Not on file  Transportation Needs: Not on file  Physical Activity: Not on file  Stress: Not on file  Social Connections: Not on file  Intimate Partner Violence: Not on file    Family History  Problem Relation Age of Onset   Colon cancer Paternal Uncle 60   Coronary artery disease Father        with CABG valve replacement in his 11's   Heart attack Mother        in her 65's   Stomach cancer Paternal Grandmother    Esophageal cancer Neg Hx    Rectal cancer Neg Hx    Colon polyps Neg Hx     Review of Systems:  As stated in the HPI and otherwise negative.   There were no vitals taken for this visit.  Physical Examination:  General: Well developed, well nourished, NAD  HEENT: OP clear, mucus membranes moist  SKIN: warm, dry. No rashes. Neuro: No focal deficits   Musculoskeletal: Muscle strength 5/5 all ext  Psychiatric: Mood and affect normal  Neck: No JVD, no carotid bruits, no thyromegaly, no lymphadenopathy.  Lungs:Clear bilaterally, no wheezes, rhonci, crackles Cardiovascular: Regular rate and rhythm. No murmurs, gallops or rubs. Abdomen:Soft. Bowel sounds present. Non-tender.  Extremities: No lower extremity edema. Pulses are 2 + in the bilateral DP/PT.  Echo 03/07/22:  1. Left ventricular ejection fraction, by estimation, is 60 to 65%. The  left ventricle has normal function. The left ventricle has no regional  wall motion abnormalities. Left ventricular diastolic parameters were  normal.   2. Right ventricular systolic function is normal. The right ventricular  size is mildly enlarged. There is normal pulmonary artery systolic  pressure.   3. Left atrial size was mildly dilated.   4. The mitral valve is normal in structure. Trivial mitral valve  regurgitation.   5. The aortic valve is tricuspid. Aortic valve regurgitation is mild.  Aortic valve sclerosis/calcification is present, without any evidence of  aortic stenosis.   6. The inferior vena cava is dilated in size with >50% respiratory  variability, suggesting right atrial pressure of 8 mmHg.   EKG:  EKG is not *** ordered today. The ekg ordered today demonstrates   Recent Labs: 07/18/2021: ALT 23 02/21/2022: BUN 13; Creatinine, Ser 1.16; Hemoglobin 15.0; NT-Pro BNP 88; Platelets 314; Potassium 4.2; Sodium 138   Lipid Panel Lipid Panel     Component Value Date/Time   CHOL 106 07/18/2021 1032   TRIG 132 07/18/2021 1032   HDL 37 (L) 07/18/2021 1032   CHOLHDL 2.9 07/18/2021 1032   CHOLHDL 3.6 08/09/2011 0710   VLDL 21 08/09/2011 0710   LDLCALC 46 07/18/2021 1032   LABVLDL 23 07/18/2021 1032      Wt Readings from Last 3 Encounters:  03/20/22 112.3 kg  02/21/22 119.8 kg  07/18/21 121.6 kg     Assessment and Plan:   1. CAD with without angina: No chest pain. CAD  stable by cardiac cath April 2022. Continue ASA and statin. Beta blocker  stopped due to fatigue.   2. HTN: BP is controlled. Continue current regimen  3. HLD:  Lipids followed in primary care. LDL 46 in 2023. Continue statin.   4. PVCs/PACs/SVT: No palpitations. Continue amiodarone.   5. Chronic diastolic CHF: LV systolic function normal by echo in October 2023. Weight stable. LE edema resolved. Continue Lasix.    Labs/ tests ordered today include:  No orders of the defined types were placed in this encounter.  Disposition:   Follow up me in 12 months.   Signed, Lauree Chandler, MD 06/17/2022 10:01 AM    [facility] [address], [place], [place]  [address] Phone: [phone]; Fax: [phone]

## 2022-06-18 ENCOUNTER — Encounter: Payer: Self-pay | Admitting: Cardiovascular Disease

## 2022-06-18 ENCOUNTER — Ambulatory Visit: Payer: PPO | Attending: Cardiovascular Disease | Admitting: Cardiovascular Disease

## 2022-06-18 VITALS — BP 118/70 | HR 62 | Ht 74.0 in | Wt 242.2 lb

## 2022-06-18 DIAGNOSIS — I2510 Atherosclerotic heart disease of native coronary artery without angina pectoris: Secondary | ICD-10-CM

## 2022-06-18 DIAGNOSIS — I5032 Chronic diastolic (congestive) heart failure: Secondary | ICD-10-CM

## 2022-06-18 DIAGNOSIS — E782 Mixed hyperlipidemia: Secondary | ICD-10-CM

## 2022-06-18 DIAGNOSIS — I10 Essential (primary) hypertension: Secondary | ICD-10-CM

## 2022-06-18 DIAGNOSIS — I493 Ventricular premature depolarization: Secondary | ICD-10-CM

## 2022-06-18 MED ORDER — AMIODARONE HCL 200 MG PO TABS: 200.0000 mg | ORAL_TABLET | Freq: Every day | ORAL | 3 refills | Status: DC

## 2022-06-18 NOTE — Patient Instructions (Signed)
Medication Instructions:  Your physician has recommended you make the following change in your medication:  1.) change in directions of amiodarone - updated prescription sent to pharmacy -   Take 1 tablet - 200 mg - DAILY  *If you need a refill on your cardiac medications before your next appointment, please call your pharmacy*   Lab Work: none If you have labs (blood work) drawn today and your tests are completely normal, you will receive your results only by: [place] (if you have MyChart) OR A paper copy in the mail If you have any lab test that is abnormal or we need to change your treatment, we will call you to review the results.   Testing/Procedures: none   Follow-Up: At [facility], you and your health needs are our priority.  As part of our continuing mission to provide you with exceptional heart care, we have created designated Provider Care Teams.  These Care Teams include your primary Cardiologist (physician) and Advanced Practice Providers (APPs -  Physician Assistants and Nurse Practitioners) who all work together to provide you with the care you need, when you need it.   Your next appointment:   12 month(s)  Provider:   Lauree Chandler, MD

## 2022-06-28 DIAGNOSIS — M170 Bilateral primary osteoarthritis of knee: Secondary | ICD-10-CM | POA: Diagnosis not present

## 2022-07-05 DIAGNOSIS — M170 Bilateral primary osteoarthritis of knee: Secondary | ICD-10-CM | POA: Diagnosis not present

## 2022-07-12 DIAGNOSIS — G4733 Obstructive sleep apnea (adult) (pediatric): Secondary | ICD-10-CM | POA: Diagnosis not present

## 2022-07-12 DIAGNOSIS — M170 Bilateral primary osteoarthritis of knee: Secondary | ICD-10-CM | POA: Diagnosis not present

## 2022-07-17 DIAGNOSIS — D6869 Other thrombophilia: Secondary | ICD-10-CM | POA: Diagnosis not present

## 2022-07-17 DIAGNOSIS — I480 Paroxysmal atrial fibrillation: Secondary | ICD-10-CM | POA: Diagnosis not present

## 2022-07-17 DIAGNOSIS — I25119 Atherosclerotic heart disease of native coronary artery with unspecified angina pectoris: Secondary | ICD-10-CM | POA: Diagnosis not present

## 2022-07-17 DIAGNOSIS — T466X5A Adverse effect of antihyperlipidemic and antiarteriosclerotic drugs, initial encounter: Secondary | ICD-10-CM | POA: Diagnosis not present

## 2022-07-17 DIAGNOSIS — R972 Elevated prostate specific antigen [PSA]: Secondary | ICD-10-CM | POA: Diagnosis not present

## 2022-07-17 DIAGNOSIS — E1169 Type 2 diabetes mellitus with other specified complication: Secondary | ICD-10-CM | POA: Diagnosis not present

## 2022-07-17 DIAGNOSIS — R351 Nocturia: Secondary | ICD-10-CM | POA: Diagnosis not present

## 2022-07-17 DIAGNOSIS — N401 Enlarged prostate with lower urinary tract symptoms: Secondary | ICD-10-CM | POA: Diagnosis not present

## 2022-07-17 DIAGNOSIS — G720 Drug-induced myopathy: Secondary | ICD-10-CM | POA: Diagnosis not present

## 2022-07-17 DIAGNOSIS — G4733 Obstructive sleep apnea (adult) (pediatric): Secondary | ICD-10-CM | POA: Diagnosis not present

## 2022-07-17 DIAGNOSIS — Z87448 Personal history of other diseases of urinary system: Secondary | ICD-10-CM | POA: Diagnosis not present

## 2022-07-17 DIAGNOSIS — E785 Hyperlipidemia, unspecified: Secondary | ICD-10-CM | POA: Diagnosis not present

## 2022-07-18 DIAGNOSIS — G4733 Obstructive sleep apnea (adult) (pediatric): Secondary | ICD-10-CM | POA: Diagnosis not present

## 2022-08-10 DIAGNOSIS — G4733 Obstructive sleep apnea (adult) (pediatric): Secondary | ICD-10-CM | POA: Diagnosis not present

## 2022-08-10 IMAGING — CT CT HEAD W/O CM
4 series · 16 of 47 positions shown, 18 images · non-contrast
Comparison: 08/08/2011

CLINICAL DATA: Syncope, history of atrial fibrillation and coronary
artery disease

EXAM:
CT HEAD WITHOUT CONTRAST
TECHNIQUE: Contiguous axial images were obtained from the base of the skull
through the vertex without intravenous contrast.

[Series 3: head without · axial · non-contrast · 0.46mm/px · z∈[-164,-34]mm · 7 of 36 slices shown, 9 images]
[im 5/36  brain]
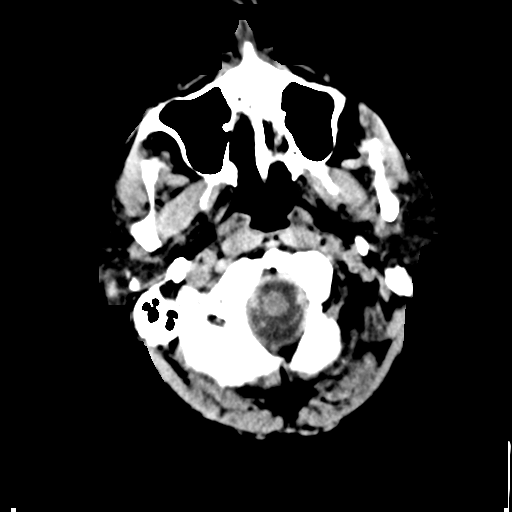
[im 5/36  bone]
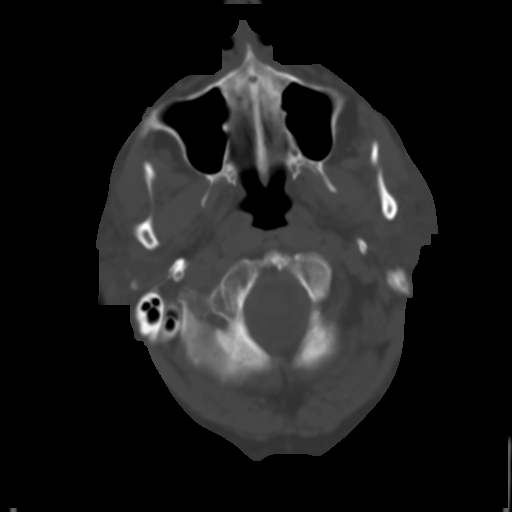
[im 9/36  brain]
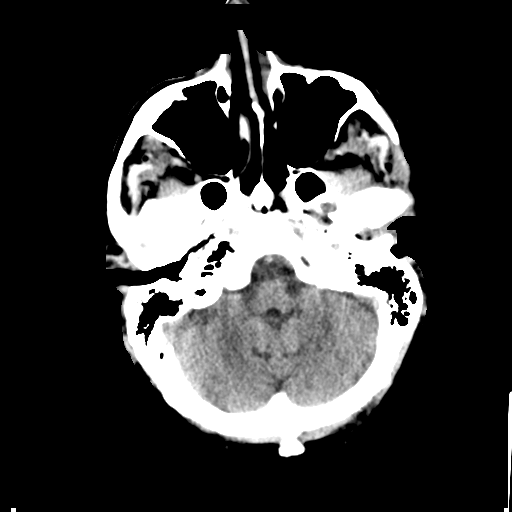
[im 14/36  brain]
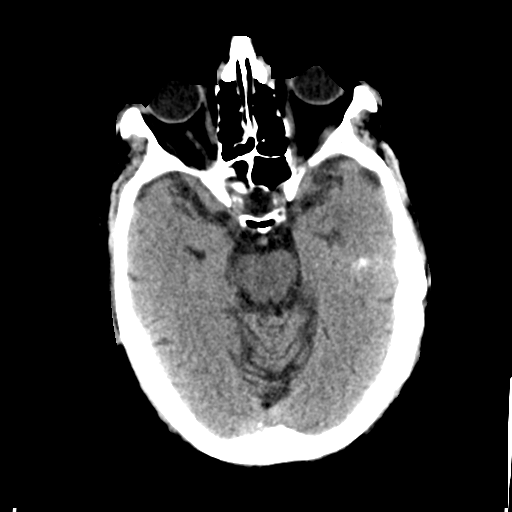
[im 18/36  brain]
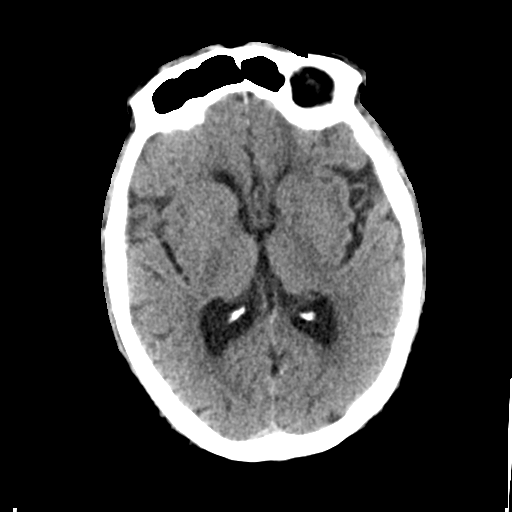
[im 22/36  brain]
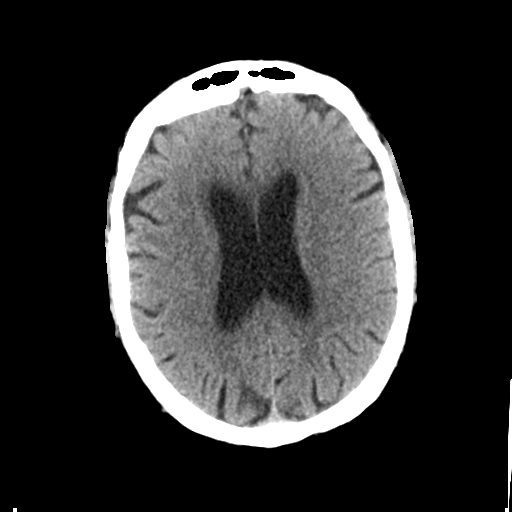
[im 22/36  bone]
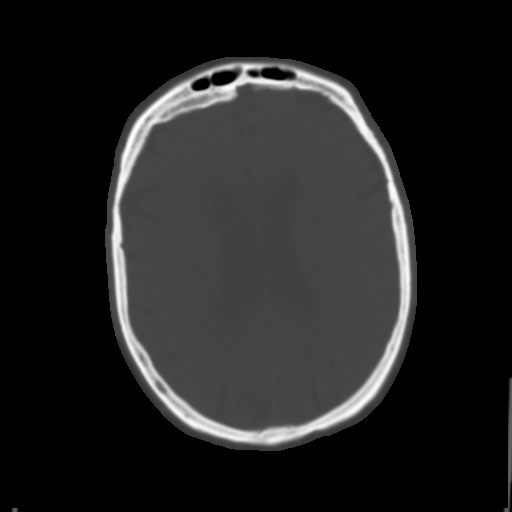
[im 27/36  brain]
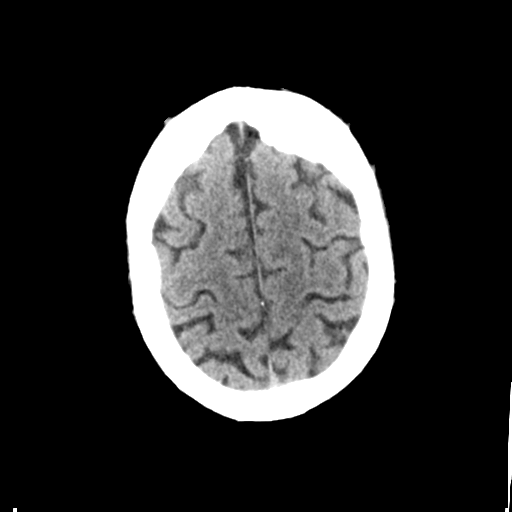
[im 31/36  brain]
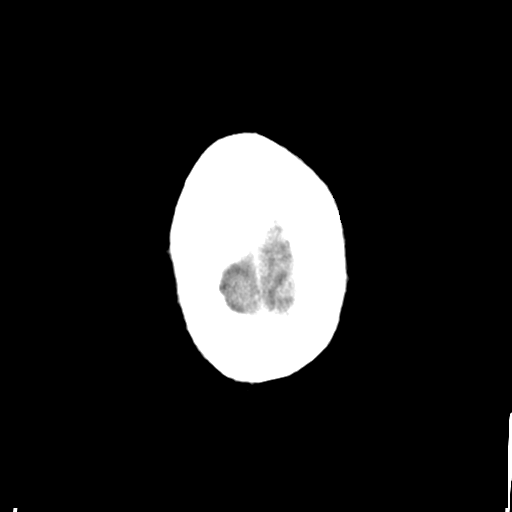

[Series 4: head bone · axial · 0.46mm/px · z∈[-168,-132]mm · 3 of 90 slices shown]
[im 9/90  bone]
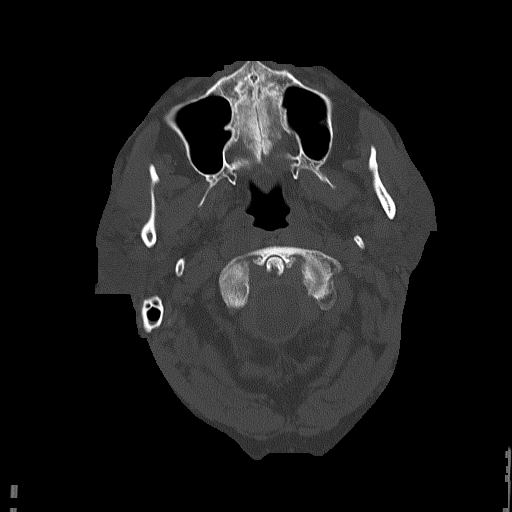
[im 18/90  bone]
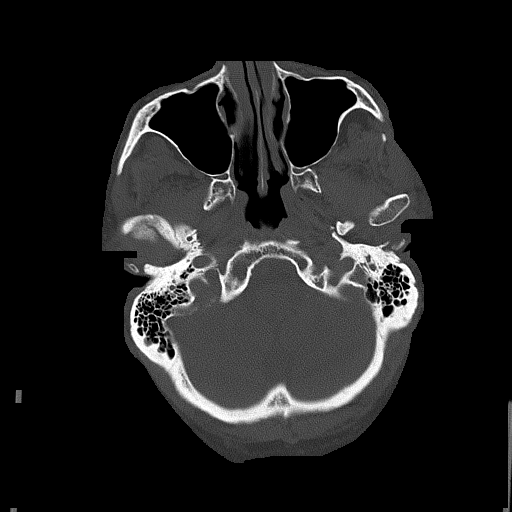
[im 27/90  bone]
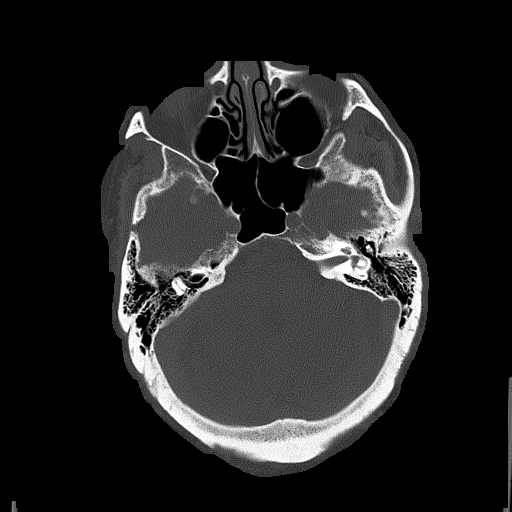

[Series 5: head without cor · coronal · non-contrast · 0.35mm/px · 3 of 71 slices shown]
[im 26/71  brain]
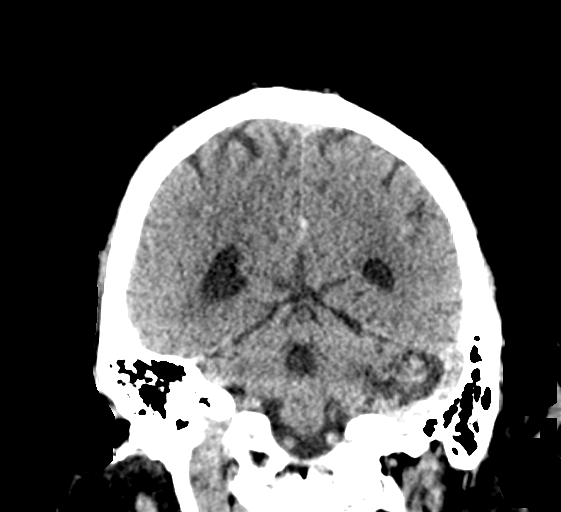
[im 32/71  brain]
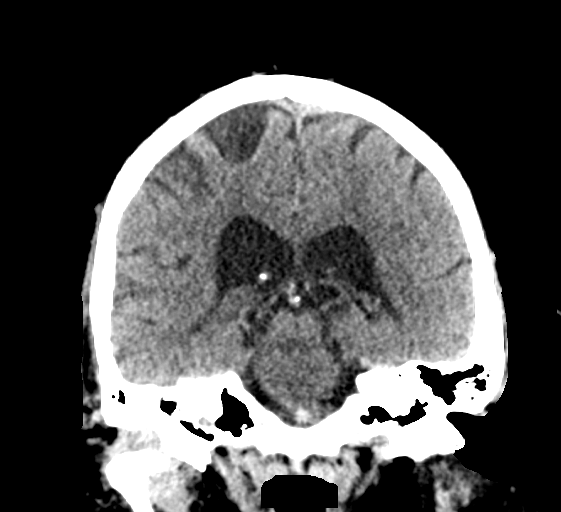
[im 39/71  brain]
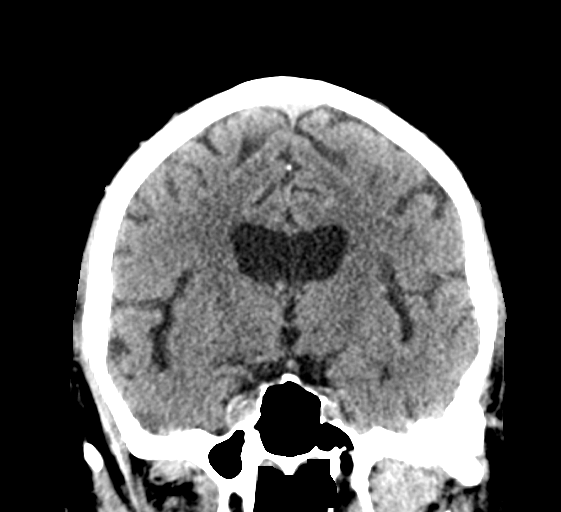

[Series 6: head without sag · sagittal · non-contrast · 0.35mm/px · 3 of 67 slices shown]
[im 23/67  brain]
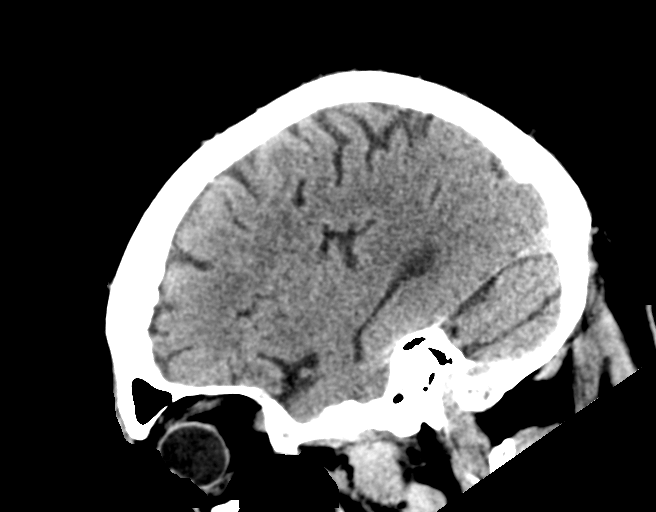
[im 34/67  brain]
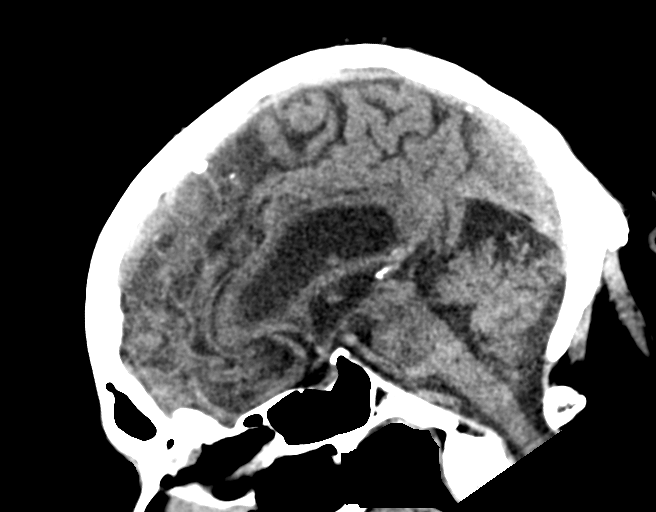
[im 45/67  brain]
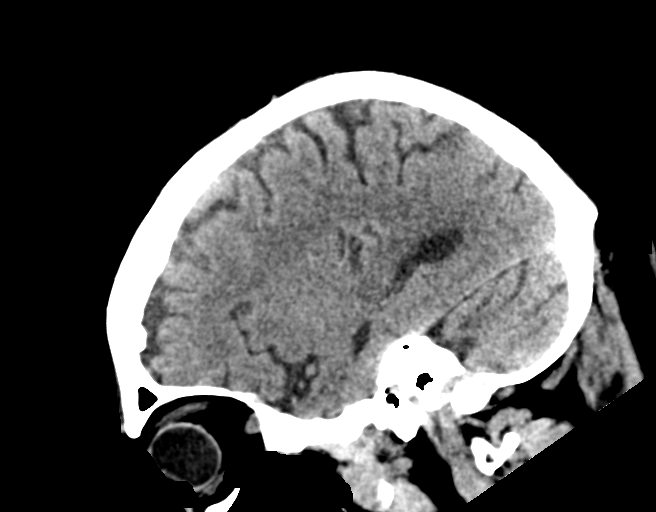

[16 of 47 positions shown; findings below may reference images not displayed]

FINDINGS: Brain: No acute infarct or hemorrhage. Lateral ventricles and
midline structures are unremarkable. No acute extra-axial fluid
collections. No mass effect.

Vascular: No hyperdense vessel or unexpected calcification.

Skull: Normal. Negative for fracture or focal lesion.

Sinuses/Orbits: No acute finding.

Other: None.
IMPRESSION: 1. No acute intracranial process.

## 2022-08-10 IMAGING — CR DG CHEST 2V
2 series · 2 of 2 positions shown · non-contrast
Comparison: 12/04/2020 at 4008 hours

CLINICAL DATA: Syncope, collapse

EXAM:
CHEST - 2 VIEW

[chest pa]
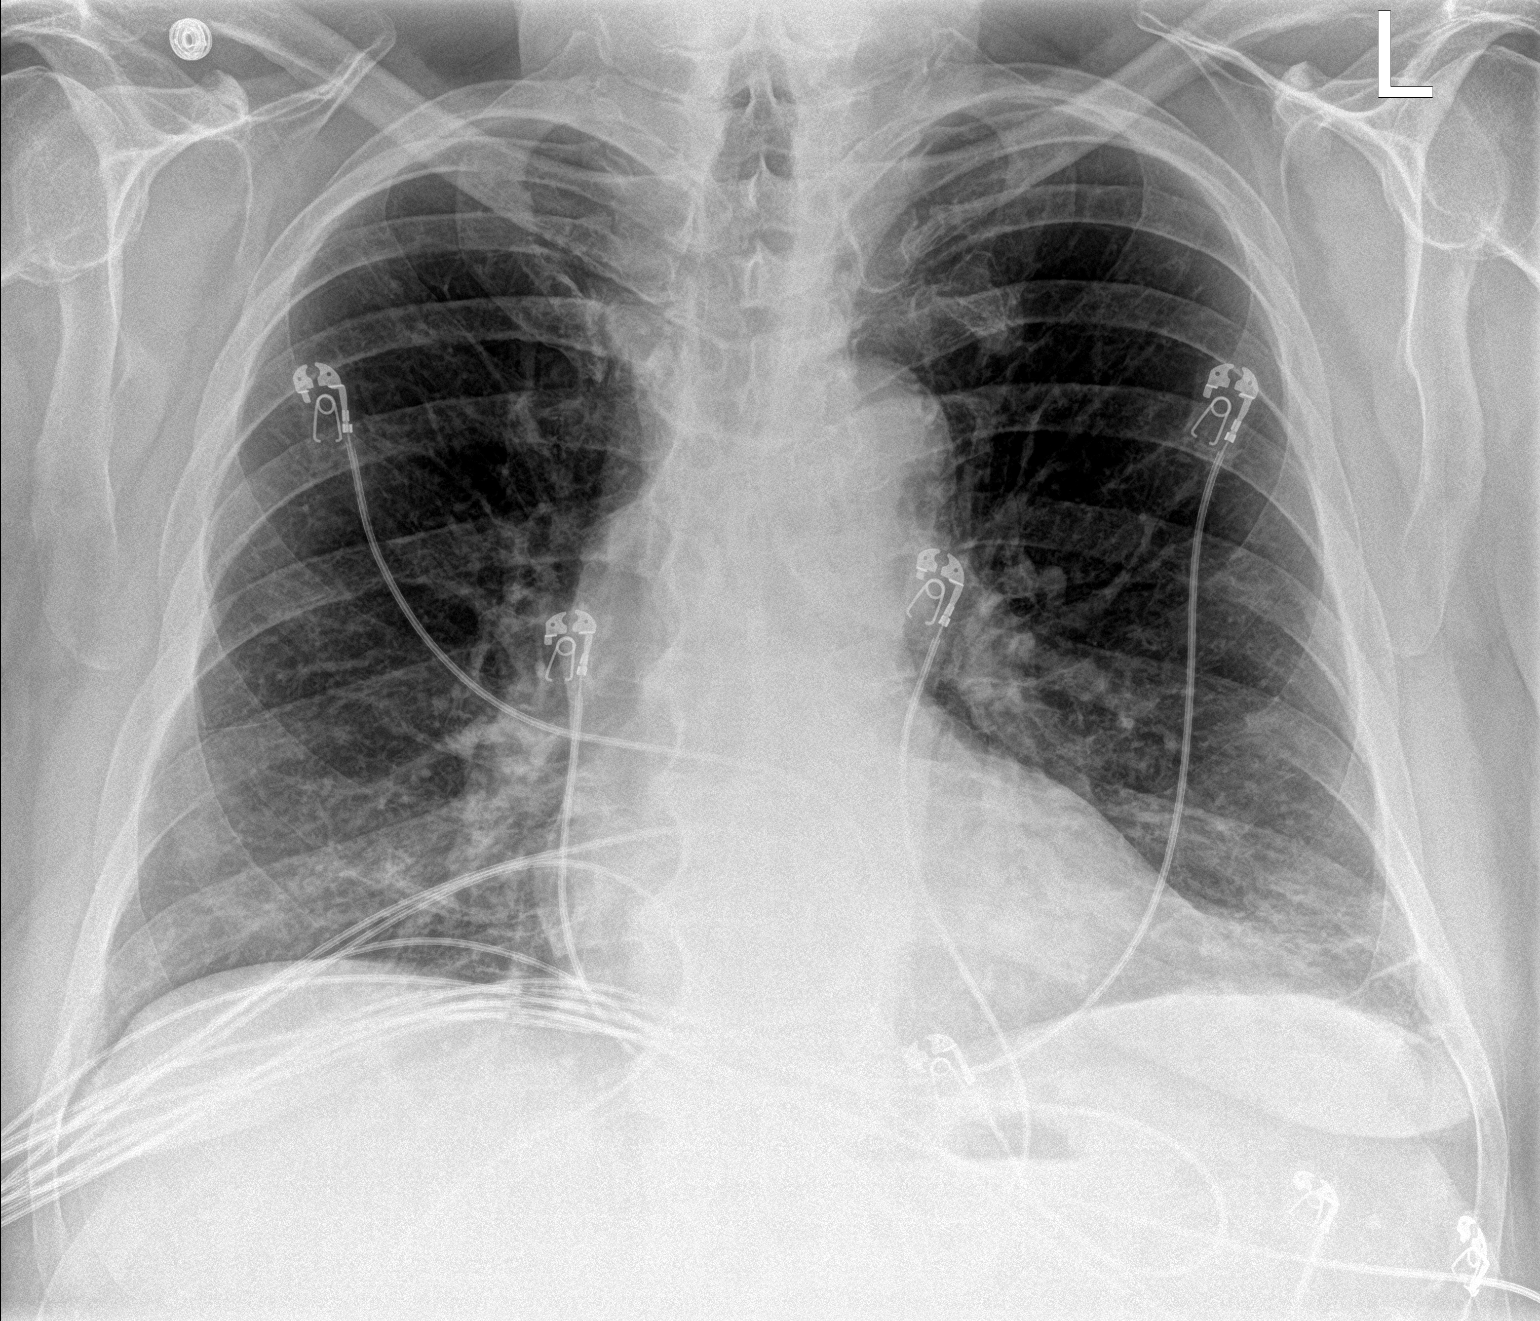

[chest lat]
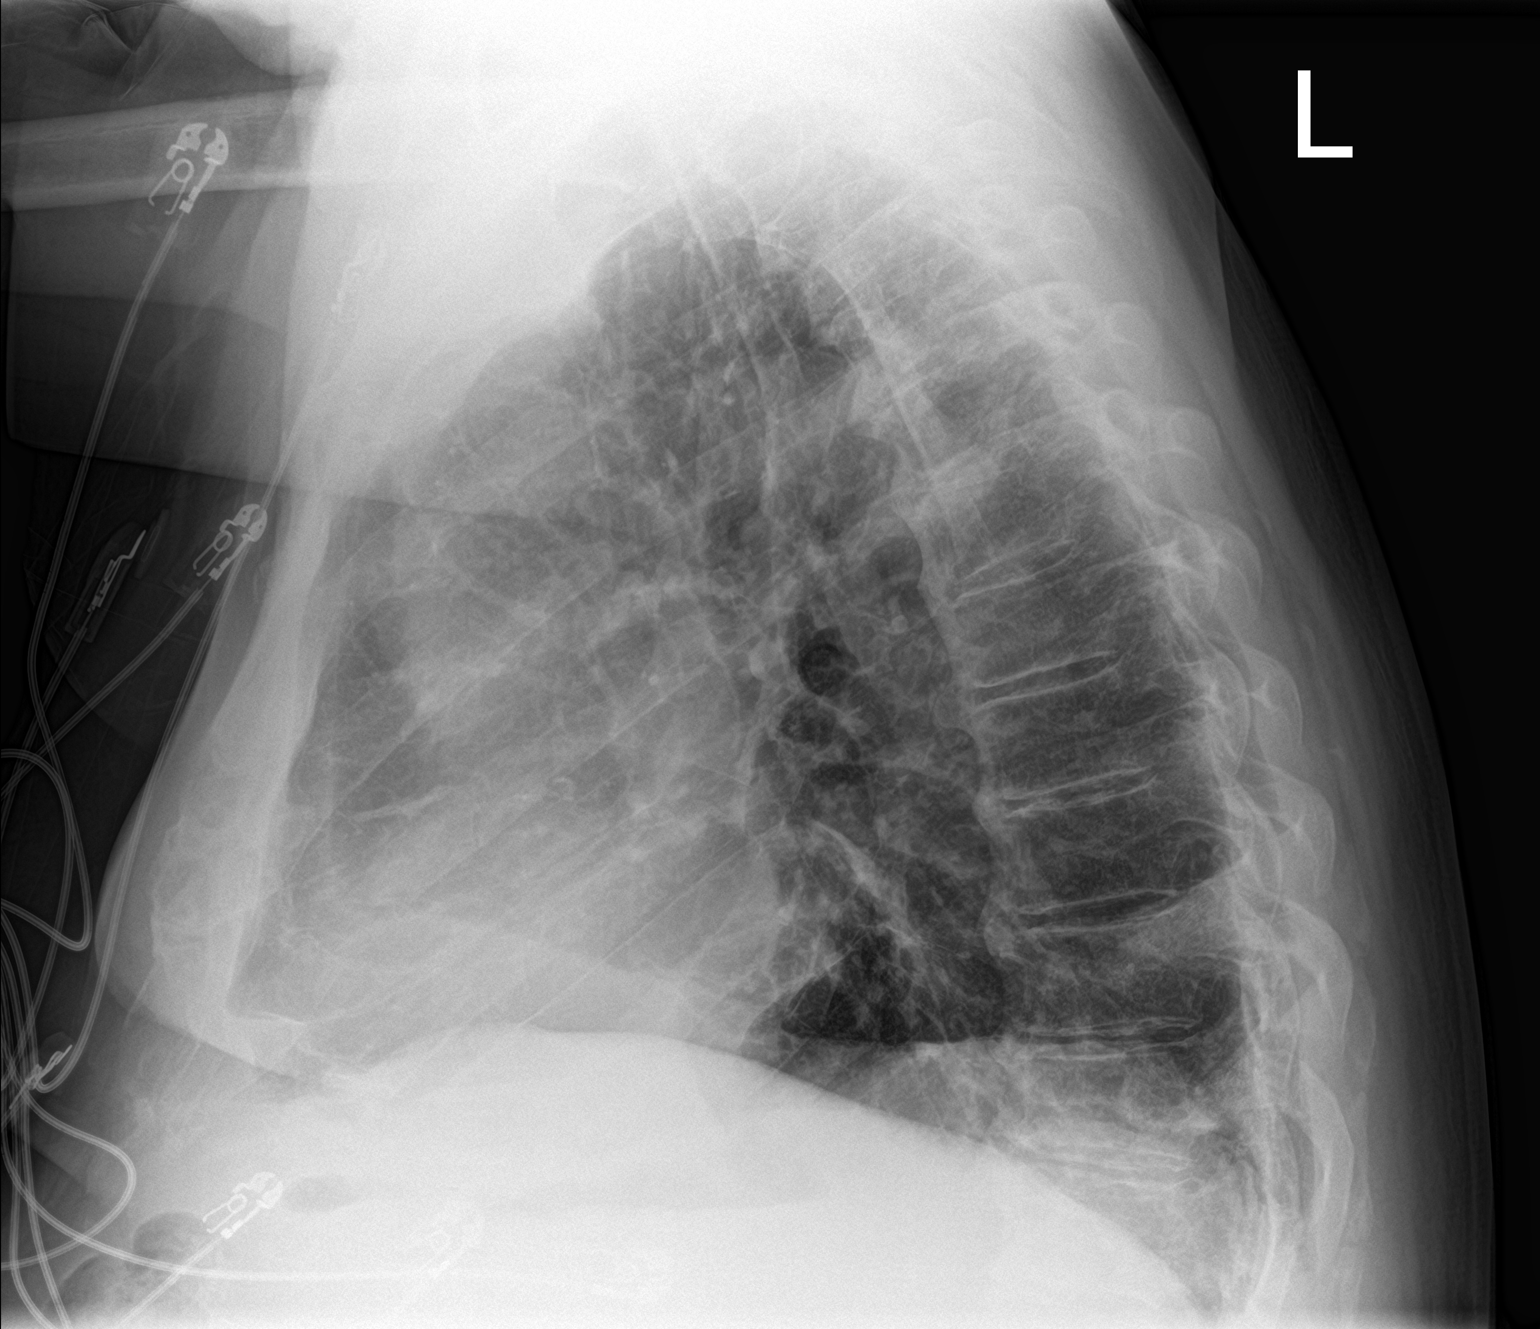

[2 of 2 positions shown; findings below may reference images not displayed]

FINDINGS: Mild patchy left lower lobe opacity, atelectasis versus pneumonia.
Mild right basilar opacity, likely atelectasis. No pleural effusion
or pneumothorax.

The heart is normal in size.

Degenerative changes of the thoracic spine.
IMPRESSION: Mild patchy left lower lobe opacity, atelectasis versus pneumonia.

## 2022-09-10 DIAGNOSIS — G4733 Obstructive sleep apnea (adult) (pediatric): Secondary | ICD-10-CM | POA: Diagnosis not present

## 2022-10-10 DIAGNOSIS — G4733 Obstructive sleep apnea (adult) (pediatric): Secondary | ICD-10-CM | POA: Diagnosis not present

## 2022-10-19 DIAGNOSIS — G4733 Obstructive sleep apnea (adult) (pediatric): Secondary | ICD-10-CM | POA: Diagnosis not present

## 2022-10-24 DIAGNOSIS — R972 Elevated prostate specific antigen [PSA]: Secondary | ICD-10-CM | POA: Diagnosis not present

## 2022-10-24 DIAGNOSIS — E1169 Type 2 diabetes mellitus with other specified complication: Secondary | ICD-10-CM | POA: Diagnosis not present

## 2022-10-24 DIAGNOSIS — E785 Hyperlipidemia, unspecified: Secondary | ICD-10-CM | POA: Diagnosis not present

## 2022-10-31 ENCOUNTER — Other Ambulatory Visit: Payer: Self-pay | Admitting: Cardiovascular Disease

## 2022-11-10 DIAGNOSIS — G4733 Obstructive sleep apnea (adult) (pediatric): Secondary | ICD-10-CM | POA: Diagnosis not present

## 2022-12-10 DIAGNOSIS — G4733 Obstructive sleep apnea (adult) (pediatric): Secondary | ICD-10-CM | POA: Diagnosis not present

## 2022-12-19 DIAGNOSIS — R972 Elevated prostate specific antigen [PSA]: Secondary | ICD-10-CM | POA: Diagnosis not present

## 2022-12-19 DIAGNOSIS — N401 Enlarged prostate with lower urinary tract symptoms: Secondary | ICD-10-CM | POA: Diagnosis not present

## 2022-12-19 DIAGNOSIS — R351 Nocturia: Secondary | ICD-10-CM | POA: Diagnosis not present

## 2022-12-25 ENCOUNTER — Other Ambulatory Visit: Payer: Self-pay | Admitting: Urology

## 2022-12-25 DIAGNOSIS — R972 Elevated prostate specific antigen [PSA]: Secondary | ICD-10-CM

## 2023-01-16 DIAGNOSIS — G4733 Obstructive sleep apnea (adult) (pediatric): Secondary | ICD-10-CM | POA: Diagnosis not present

## 2023-01-19 DIAGNOSIS — G4733 Obstructive sleep apnea (adult) (pediatric): Secondary | ICD-10-CM | POA: Diagnosis not present

## 2023-01-31 NOTE — Progress Notes (Deleted)
Cardiology Office Note:    Date:  01/31/2023  ID:  Peter Brooks, DOB Oct 05, 1945, MRN [id number] PCP: Street, Stephanie Coup, MD   HeartCare Providers Cardiologist:  Verne Carrow, MD { Click to update primary MD,subspecialty MD or APP then REFRESH:1}    {Click to Open Review  :1}   Patient Profile:      Coronary artery disease [anginal equiv - CP/SOB] S/p DES to R PDA in 10/19 Myoview 8/21: no ischemia; low risk S/p 2.25 x 12 mm DES to OM1 in 9/21 Cath 09/06/20: PDA and OM1 stents patent; mLAD 50-70, Dx 50 - Med Rx (HFpEF) heart failure with preserved ejection fraction  Echo 5/21: EF 60-65 mild LVH, Gr 1 DD, RVSP 31.1, AV sclerosis without stenosis Echo 7/22: EF 60-65, GR 1 DD TTE 03/07/22: EF 60-65, no RWMA, normal RVSF, mild LAE, trivial MR, mild AI, RAP 8, RVSP 35.2 Hypertension  Hyperlipidemia  Supraventricular Tachycardia Monitor 5/22: 19 Supraventricular Tachycardia episodes (longest 2"22'; fastest 5 beats HR 158) PVCs  Monitor 5/22: 3.2% burden  EP eval 11/22 - Dr. Ladona Ridgel; beta-blocker ? to Amiodarone  GERD Diabetes mellitus  Hypothyroidism       {      :1}   History of Present Illness:  Discussed the use of AI scribe software for clinical note transcription with the patient, who gave verbal consent to proceed.  History of Present Illness          Peter Brooks is a 77 y.o. male who returns for ***. He was last seen by Dr. Clifton James in 05/2022. *** ROS: ***    Studies Reviewed:       *** Risk Assessment/Calculations:   {Does this patient have ATRIAL FIBRILLATION?[phone]} No BP recorded.  {Refresh Note OR Click here to enter BP  :1}***       Physical Exam:   VS:  There were no vitals taken for this visit.   Wt Readings from Last 3 Encounters:  06/18/22 242 lb 3.2 oz (109.9 kg)  03/20/22 247 lb 9.6 oz (112.3 kg)  02/21/22 264 lb 3.2 oz (119.8 kg)    Physical Exam***     Assessment and Plan:  No problem-specific Assessment & Plan  notes found for this encounter. Assessment and Plan             *** { (HFpEF) heart failure with preserved ejection fraction (HCC) Volume status is improved/stable.  He has been dieting and has lost about 17 pounds since he was last here.  He feels much better.  No further testing or medication adjustments are necessary.  Continue current dose of furosemide, Farxiga.   CAD (coronary artery disease) History of DES to the RPDA in 2019 and DES to the OM1 in 2021.  Cardiac catheterization in April 2022 demonstrated patent stents and moderate disease in the LAD which is treated medically.  He is doing well without angina.  Continue current dose of aspirin, Crestor.   Essential hypertension Blood pressure is controlled.  Continue current dose of amlodipine.   PVC's (premature ventricular contractions) ALT in August 2023 was 22.  TSH in June 2023 was 3.417  He is overall stable and is not having symptoms.  He would like to reduce the dose of amiodarone if possible.  I think it is reasonable to cut back on his amiodarone to 200 mg daily Monday through Friday and 100 mg on Saturdays and Sundays.     :1}    {Are you ordering a  CV Procedure (e.g. stress test, cath, DCCV, TEE, etc)?   Press F2        :[id number]}  Dispo:  No follow-ups on file.  Signed, Tereso Newcomer, PA-C

## 2023-02-01 ENCOUNTER — Ambulatory Visit: Payer: PPO | Admitting: Physician Assistant

## 2023-02-01 DIAGNOSIS — I5032 Chronic diastolic (congestive) heart failure: Secondary | ICD-10-CM

## 2023-02-01 DIAGNOSIS — I2510 Atherosclerotic heart disease of native coronary artery without angina pectoris: Secondary | ICD-10-CM

## 2023-02-01 DIAGNOSIS — I493 Ventricular premature depolarization: Secondary | ICD-10-CM

## 2023-02-05 DIAGNOSIS — I5189 Other ill-defined heart diseases: Secondary | ICD-10-CM | POA: Diagnosis not present

## 2023-02-05 DIAGNOSIS — Z0000 Encounter for general adult medical examination without abnormal findings: Secondary | ICD-10-CM | POA: Diagnosis not present

## 2023-02-05 DIAGNOSIS — E785 Hyperlipidemia, unspecified: Secondary | ICD-10-CM | POA: Diagnosis not present

## 2023-02-05 DIAGNOSIS — R972 Elevated prostate specific antigen [PSA]: Secondary | ICD-10-CM | POA: Diagnosis not present

## 2023-02-05 DIAGNOSIS — I25119 Atherosclerotic heart disease of native coronary artery with unspecified angina pectoris: Secondary | ICD-10-CM | POA: Diagnosis not present

## 2023-02-05 DIAGNOSIS — I480 Paroxysmal atrial fibrillation: Secondary | ICD-10-CM | POA: Diagnosis not present

## 2023-02-05 DIAGNOSIS — T466X5A Adverse effect of antihyperlipidemic and antiarteriosclerotic drugs, initial encounter: Secondary | ICD-10-CM | POA: Diagnosis not present

## 2023-02-05 DIAGNOSIS — E1169 Type 2 diabetes mellitus with other specified complication: Secondary | ICD-10-CM | POA: Diagnosis not present

## 2023-02-05 DIAGNOSIS — G4733 Obstructive sleep apnea (adult) (pediatric): Secondary | ICD-10-CM | POA: Diagnosis not present

## 2023-02-05 DIAGNOSIS — E039 Hypothyroidism, unspecified: Secondary | ICD-10-CM | POA: Diagnosis not present

## 2023-02-05 DIAGNOSIS — D6869 Other thrombophilia: Secondary | ICD-10-CM | POA: Diagnosis not present

## 2023-02-05 DIAGNOSIS — G720 Drug-induced myopathy: Secondary | ICD-10-CM | POA: Diagnosis not present

## 2023-02-07 ENCOUNTER — Ambulatory Visit
Admission: RE | Admit: 2023-02-07 | Discharge: 2023-02-07 | Disposition: A | Discharge status: Home / Self Care | Payer: PPO | Source: Ambulatory Visit | Attending: Urology | Admitting: Urology

## 2023-02-07 DIAGNOSIS — R972 Elevated prostate specific antigen [PSA]: Secondary | ICD-10-CM

## 2023-02-07 MED ORDER — GADOPICLENOL 0.5 MMOL/ML IV SOLN
10.0000 mL | Freq: Once | INTRAVENOUS | Status: AC | PRN
  Administered 2023-02-07: 10 mL via INTRAVENOUS

## 2023-02-12 ENCOUNTER — Telehealth: Payer: Self-pay | Admitting: Cardiovascular Disease

## 2023-02-12 NOTE — Telephone Encounter (Signed)
Pt c/o Shortness Of Breath: STAT if SOB developed within the last 24 hours or pt is noticeably SOB on the phone  1. Are you currently SOB (can you hear that pt is SOB on the phone)? no  2. How long have you been experiencing SOB? For about two months.   3. Are you SOB when sitting or when up moving around? When moving around.  4. Are you currently experiencing any other symptoms? Patient states he is having light headedness, and palpitations.

## 2023-02-12 NOTE — Telephone Encounter (Signed)
Patient states he has been have SOB with exertion. He stated he was experiencing palpitations and lightheadedness a couple of days ago.  Currently asymptomatic but feels his SOB has been getting worse. He had to reschedule his appointment from the 9/19 and they gave him a December appointment. I rescheduled him for 9/27 with Alden Server.

## 2023-02-14 NOTE — Progress Notes (Signed)
Cardiology Office Note    Patient Name: Peter Brooks Date of Encounter: 02/14/2023  Primary Care Provider:  Street, Stephanie Coup, MD Primary Cardiologist:  Verne Carrow, MD Primary Electrophysiologist: None   Past Medical History    Past Medical History:  Diagnosis Date   (HFpEF) heart failure with preserved ejection fraction (HCC) 02/20/2022   Echo 02/2022: EF 60-65, no RWMA, mild RVE, normal PASP (RVSP 35.2), mild LAE, trivial MR, AV sclerosis without stenosis, mild AI, RAP 8   Anxiety    Arthritis    CAD (coronary artery disease)    LHC 02/18/2020: prior stent to RPDA patent, 90% stenosis of OM1 s/p PTCA/DES, 50% 1st Diag, 50% proximal to mid LAD, 20% distal LAD, 30% ostial to proximal CX, 30% OM2.   Cataract    removed bilaterally    Chest pain    Diabetes mellitus without complication (HCC)    Dizziness    GERD (gastroesophageal reflux disease)    Hyperlipidemia    Hypertension    Hypothyroidism    Lumbar herniated disc     History of Present Illness  HRISTOPHER Brooks is a 77 y.o. male with a PMH of CAD s/p [facility] 01/2020 with PCI/DES to OM1, patent RPDA stent, relook LHC 08/2020 with patent stents nonobstructive LAD, HFpEF, HTN, HLD, GERD, SVT, DM type II, hypothyroidism who presents today for follow-up.  Mr. Peter Brooks has been followed by Dr. Clifton James since 2013 was previously followed by Dr. Dickie La.  He underwent an LHC 10/2008 with nonobstructive CAD noted.  He was seen in 05/2013 with complaint of heart racing dizziness and fatigue.  Myoview was completed that showed no evidence of ischemia and 2D echo completed showing normal EF with no significant valve issues.  He wore an event monitor in 2015 with no arrhythmias noted.  Presented to the office on 11/2017 with complaints of palpitations.  An event monitor 12/2017 that showed PVCs and PACs with short runs of nonsustained VT.  He was treated with Toprol seen back in the office on 02/2018 with progressive dyspnea and  chest pain.  He underwent LHC 02/2018 showed severe stenosis and right PDA that was treated with DES x 1 mild nonobstructive disease in LAD and circumflex.  He was seen in 12/2018 and had complaint of chest pain with exertion underwent nuclear stress test 02/2019 showing no evidence of ischemia.  He had a event monitor 2020 that showed short runs of SVT patient was started on Lasix for complaint of dyspnea on exertion.  He continued to have chest discomfort when seen in 9/21 and underwent RHC that showed patent R PDA stent and severe stenosis and first OM that was treated with DES x 1.  Repeat LHC completed 08/2018 showed stable CAD.  He was seen in 09/2020 with complaint of weakness and dizziness and wore a 14-day ZIO monitor that showed 19 runs of SVT longest lasting 2 minutes and 22 seconds.  He was admitted on 11/2020 with possible syncopal episode 2D echo that showed EF of 60 to 65% with no significant valve disease.  He was referred to Dr. Ladona Ridgel in the EP clinic and Toprol XL was discontinued and patient was started on amiodarone for management of PVCs.  He was last seen by Dr. Clifton James on 06/18/2022 for follow-up. Patient was doing well and no medication changes made at that time.  Contacted our office on 02/12/2023 with complaint of shortness of breath with exertion palpitations and lightheadedness.  During today's visit the patient  reports that he is not experiencing any shortness of breath or palpitations with lightheadedness today.  His blood pressure today was controlled at 110/70 and heart rate was 60 bpm.  He reports some increase stress due to his daughter's marital difficulties.  He recently retired on July 14 and is currently adjusting to his new lifestyle.  He reports shortness of breath occurring with exertion but denies any dizziness or chest pain.  He is scheduled to undergo a MRI biopsy of the prostate and also require surgical clearance today.  He continues to stay active and walks 2 miles daily  and cuts his own lawn using a riding mower and uses weed eater without any difficulty..  Patient denies chest pain, palpitations, dyspnea, PND, orthopnea, nausea, vomiting, dizziness, syncope, edema, weight gain, or early satiety.   Review of Systems  Please see the history of present illness.    All other systems reviewed and are otherwise negative except as noted above.  Physical Exam    Wt Readings from Last 3 Encounters:  06/18/22 242 lb 3.2 oz (109.9 kg)  03/20/22 247 lb 9.6 oz (112.3 kg)  02/21/22 264 lb 3.2 oz (119.8 kg)   OZ:HYQMV were no vitals filed for this visit.,There is no height or weight on file to calculate BMI. GEN: Well nourished, well developed in no acute distress Neck: No JVD; No carotid bruits Pulmonary: Clear to auscultation without rales, wheezing or rhonchi  Cardiovascular: Normal rate. Regular rhythm. Normal S1. Normal S2.   Murmurs: There is no murmur.  ABDOMEN: Soft, non-tender, non-distended EXTREMITIES:  No edema; No deformity   EKG/LABS/ Recent Cardiac Studies   ECG personally reviewed by me today -sinus rhythm with left axis deviation and no acute changes with rate of 60 bpm  Risk Assessment/Calculations:          Lab Results  Component Value Date   WBC 7.2 02/21/2022   HGB 15.0 02/21/2022   HCT 46.0 02/21/2022   MCV 89 02/21/2022   PLT 314 02/21/2022   Lab Results  Component Value Date   CREATININE 1.16 02/21/2022   BUN 13 02/21/2022   NA 138 02/21/2022   K 4.2 02/21/2022   CL 100 02/21/2022   CO2 23 02/21/2022   Lab Results  Component Value Date   CHOL 106 07/18/2021   HDL 37 (L) 07/18/2021   LDLCALC 46 07/18/2021   TRIG 132 07/18/2021   CHOLHDL 2.9 07/18/2021    Lab Results  Component Value Date   HGBA1C 7.8 (H) 12/04/2020   Assessment & Plan    1.  Shortness of breath and lightheadedness: -Patient contacted the office 02/12/2023 with complaint of shortness of breath on exertion potation's and lightheadedness -Today  patient reports resolution to palpitations and shortness of breath -He does report shortness of breath is more prominent with physical exertion and was his anginal equivalent -We will start Imdur 15 mg daily with additional 50 mg as needed -If discomfort or shortness of breath continues we may increase Norvasc to 10 mg  2.  Coronary artery disease: -s/p [facility] 01/2020 with PCI/DES to OM1, patent RPDA stent, relook LHC 08/2020 with patent stents nonobstructive LAD -Today patient reports shortness of breath but no chest pain however shortness of breath was his anginal equivalent -He will start long-acting nitrate as noted above -Continue current GDMT with Crestor 10 mg daily, Nitrostat 0.4 mg as needed, ASA 81 mg  3.  HFpEF: -Last 2D echo completed on 02/2022 of 60 to 65% with  mildly dilated LA with trivial MVR -Today patient reports shortness of breath with exertion that is relieved with rest -CBC and BMET today -Continue Lasix 40 mg daily -Low sodium diet, fluid restriction <2L, and daily weights encouraged. Educated to contact our office for weight gain of 2 lbs overnight or 5 lbs in one week.   4.  Essential hypertension: -Patient's blood pressure today was controlled at 110/70 -Continue Norvasc 5 mg daily  5.  History of PVCs: -Previous event monitor worn in 2022 with 19 SVT episodes referred to the EP beta-blocker discontinued and amiodarone added -Patient reports occasional episodes of palpitations that resolved spontaneously and are not sustained -Patient will complete 3-day Zio patch -Continue amiodarone 100 mg daily  6. Preoperative clearance: -Patient's RCRI score is 11% -The patient affirms he has been doing well without any new cardiac symptoms. They are able to achieve 7 METS without cardiac limitations. Therefore, based on ACC/AHA guidelines, the patient would be at acceptable risk for the planned procedure without further cardiovascular testing. The patient was advised that  if he develops new symptoms prior to surgery to contact our office to arrange for a follow-up visit, and he verbalized understanding.  -Patient can hold aspirin for 5 to 7 days prior to procedure and should restart postprocedure when surgically safe and directed by urologist.    Disposition: Follow-up with Verne Carrow, MD or APP in 2-3 months    Signed, Napoleon Form, Leodis Rains, NP 02/14/2023, 8:11 PM [facility]

## 2023-02-15 ENCOUNTER — Ambulatory Visit: Payer: PPO | Attending: Physician Assistant | Admitting: Nurse Practitioner

## 2023-02-15 ENCOUNTER — Encounter: Payer: Self-pay | Admitting: Nurse Practitioner

## 2023-02-15 ENCOUNTER — Ambulatory Visit (INDEPENDENT_AMBULATORY_CARE_PROVIDER_SITE_OTHER): Payer: PPO

## 2023-02-15 VITALS — BP 110/70 | HR 60 | Ht 74.0 in | Wt 250.0 lb

## 2023-02-15 DIAGNOSIS — Z01810 Encounter for preprocedural cardiovascular examination: Secondary | ICD-10-CM

## 2023-02-15 DIAGNOSIS — I493 Ventricular premature depolarization: Secondary | ICD-10-CM | POA: Diagnosis not present

## 2023-02-15 DIAGNOSIS — R0602 Shortness of breath: Secondary | ICD-10-CM

## 2023-02-15 DIAGNOSIS — I10 Essential (primary) hypertension: Secondary | ICD-10-CM

## 2023-02-15 DIAGNOSIS — I2510 Atherosclerotic heart disease of native coronary artery without angina pectoris: Secondary | ICD-10-CM

## 2023-02-15 DIAGNOSIS — I5032 Chronic diastolic (congestive) heart failure: Secondary | ICD-10-CM

## 2023-02-15 MED ORDER — ISOSORBIDE MONONITRATE ER 30 MG PO TB24: 15.0000 mg | ORAL_TABLET | Freq: Every day | ORAL | 3 refills | Status: DC

## 2023-02-15 NOTE — Patient Instructions (Addendum)
Medication Instructions:  START Imdur 30mg  Take half tablet once a day and a additional half tablet as needed *If you need a refill on your cardiac medications before your next appointment, please call your pharmacy*   Lab Work: TODAY-BMET & CBC If you have labs (blood work) drawn today and your tests are completely normal, you will receive your results only by: MyChart Message (if you have MyChart) OR A paper copy in the mail If you have any lab test that is abnormal or we need to change your treatment, we will call you to review the results.   Testing/Procedures: Christena Deem- Long Term Monitor Instructions  Your physician has requested you wear a ZIO patch monitor for 14 days.  This is a single patch monitor. [employer] supplies one patch monitor per enrollment. Additional stickers are not available. Please do not apply patch if you will be having a Nuclear Stress Test,  Echocardiogram, Cardiac CT, MRI, or Chest Xray during the period you would be wearing the  monitor. The patch cannot be worn during these tests. You cannot remove and re-apply the  ZIO XT patch monitor.  Your ZIO patch monitor will be mailed 3 day USPS to your address on file. It may take 3-5 days  to receive your monitor after you have been enrolled.  Once you have received your monitor, please review the enclosed instructions. Your monitor  has already been registered assigning a specific monitor serial # to you.  Billing and Patient Assistance Program Information  We have supplied [employer] with any of your insurance information on file for billing purposes. [employer] offers a sliding scale Patient Assistance Program for patients that do not have  insurance, or whose insurance does not completely cover the cost of the ZIO monitor.  You must apply for the Patient Assistance Program to qualify for this discounted rate.  To apply, please call [employer] at [phone], select option 4, select option 2, ask to apply for  Patient  Assistance Program. Meredeth Ide will ask your household income, and how many people  are in your household. They will quote your out-of-pocket cost based on that information.  [employer] will also be able to set up a 3-month, interest-free payment plan if needed.  Applying the monitor   Shave hair from upper left chest.  Hold abrader disc by orange tab. Rub abrader in 40 strokes over the upper left chest as  indicated in your monitor instructions.  Clean area with 4 enclosed alcohol pads. Let dry.  Apply patch as indicated in monitor instructions. Patch will be placed under collarbone on left  side of chest with arrow pointing upward.  Rub patch adhesive wings for 2 minutes. Remove white label marked "1". Remove the white  label marked "2". Rub patch adhesive wings for 2 additional minutes.  While looking in a mirror, press and release button in center of patch. A small green light will  flash 3-4 times. This will be your only indicator that the monitor has been turned on.  Do not shower for the first 24 hours. You may shower after the first 24 hours.  Press the button if you feel a symptom. You will hear a small click. Record Date, Time and  Symptom in the Patient Logbook.  When you are ready to remove the patch, follow instructions on the last 2 pages of Patient  Logbook. Stick patch monitor onto the last page of Patient Logbook.  Place Patient Logbook in the blue and white box. Use  locking tab on box and tape box closed  securely. The blue and white box has prepaid postage on it. Please place it in the mailbox as  soon as possible. Your physician should have your test results approximately 7 days after the  monitor has been mailed back to [facility].  Call [facility] Customer Care at [phone] if you have questions regarding  your ZIO XT patch monitor. Call them immediately if you see an orange light blinking on your  monitor.  If your monitor falls off in less than 4 days,  contact our Monitor department at [phone].  If your monitor becomes loose or falls off after 4 days call [employer] at [phone] for  suggestions on securing your monitor   Follow-Up: At [facility], you and your health needs are our priority.  As part of our continuing mission to provide you with exceptional heart care, we have created designated Provider Care Teams.  These Care Teams include your primary Cardiologist (physician) and Advanced Practice Providers (APPs -  Physician Assistants and Nurse Practitioners) who all work together to provide you with the care you need, when you need it.  We recommend signing up for the patient portal called "MyChart".  Sign up information is provided on this After Visit Summary.  MyChart is used to connect with patients for Virtual Visits (Telemedicine).  Patients are able to view lab/test results, encounter notes, upcoming appointments, etc.  Non-urgent messages can be sent to your provider as well.   To learn more about what you can do with MyChart, go to [email or web address].    Your next appointment:   2-3 month(s)  Provider:   Robin Searing, NP  or Robin Searing, NP  Other Instructions

## 2023-02-15 NOTE — Progress Notes (Unsigned)
Applied a 3 day Zio XT monitor to patient in the office  [place] to read

## 2023-02-16 LAB — BASIC METABOLIC PANEL
BUN/Creatinine Ratio: 12 (ref 10–24)
BUN: 17 mg/dL (ref 8–27)
CO2: 25 mmol/L (ref 20–29)
Calcium: 9.5 mg/dL (ref 8.6–10.2)
Chloride: 103 mmol/L (ref 96–106)
Creatinine, Ser: 1.38 mg/dL — ABNORMAL HIGH (ref 0.76–1.27)
Glucose: 116 mg/dL — ABNORMAL HIGH (ref 70–99)
Potassium: 4.5 mmol/L (ref 3.5–5.2)
Sodium: 143 mmol/L (ref 134–144)
eGFR: 52 mL/min/{1.73_m2} — ABNORMAL LOW (ref >59)

## 2023-02-16 LAB — CBC
Hematocrit: 47.6 % (ref 37.5–51.0)
Hemoglobin: 15.5 g/dL (ref 13.0–17.7)
MCH: 30 pg (ref 26.6–33.0)
MCHC: 32.6 g/dL (ref 31.5–35.7)
MCV: 92 fL (ref 79–97)
Platelets: 287 10*3/uL (ref 150–450)
RBC: 5.17 x10E6/uL (ref 4.14–5.80)
RDW: 13 % (ref 11.6–15.4)
WBC: 7.4 10*3/uL (ref 3.4–10.8)

## 2023-02-18 ENCOUNTER — Telehealth: Payer: Self-pay | Admitting: *Deleted

## 2023-02-18 NOTE — Telephone Encounter (Signed)
   Patient Name: Peter Brooks  DOB: 1946-05-14 MRN: [id number]  Primary Cardiologist: Verne Carrow, MD  Chart reviewed as part of pre-operative protocol coverage. Given past medical history and time since last visit, based on ACC/AHA guidelines, Peter Brooks is at acceptable risk for the planned procedure without further cardiovascular testing.   The patient was advised that if he develops new symptoms prior to surgery to contact our office to arrange for a follow-up visit, and he verbalized understanding.  Patient can hold aspirin 5 to 7 days prior to procedure and should restart postprocedure when surgically safe and directed by urologist.  I will route this recommendation to the requesting party via Epic fax function and remove from pre-op pool.  Please call with questions.  Napoleon Form, Leodis Rains, NP 02/18/2023, 9:53 AM

## 2023-02-18 NOTE — Telephone Encounter (Signed)
   Pre-operative Risk Assessment    Patient Name: Peter Brooks  DOB: Aug 28, 1945 MRN: [id number]    DATE OF LAST VISIT: 02/15/23 Robin Searing, NP DATE OF NEXT VISIT: 04/12/23 Robin Searing, NP  Request for Surgical Clearance    Procedure:   MRI FUSION PROSTATE Bx  Date of Surgery:  Clearance 02/22/23                                 Surgeon:  DR. Annabell Howells Surgeon's Group or Practice Name:  [facility] Phone number:  [phone] Fax number:  [phone]   Type of Clearance Requested:   - Medical ; ASA x 5 DAYS PRIOR   Type of Anesthesia:  Not Indicated   Additional requests/questions:    Elpidio Anis   02/18/2023, 9:48 AM

## 2023-02-22 DIAGNOSIS — I493 Ventricular premature depolarization: Secondary | ICD-10-CM | POA: Diagnosis not present

## 2023-02-22 DIAGNOSIS — R0602 Shortness of breath: Secondary | ICD-10-CM | POA: Diagnosis not present

## 2023-02-22 DIAGNOSIS — R972 Elevated prostate specific antigen [PSA]: Secondary | ICD-10-CM | POA: Diagnosis not present

## 2023-02-22 DIAGNOSIS — C61 Malignant neoplasm of prostate: Secondary | ICD-10-CM | POA: Diagnosis not present

## 2023-03-12 DIAGNOSIS — Z23 Encounter for immunization: Secondary | ICD-10-CM | POA: Diagnosis not present

## 2023-03-18 ENCOUNTER — Other Ambulatory Visit: Payer: Self-pay | Admitting: Physician Assistant

## 2023-03-20 ENCOUNTER — Other Ambulatory Visit: Payer: Self-pay

## 2023-03-20 MED ORDER — AMIODARONE HCL 100 MG PO TABS: 100.0000 mg | ORAL_TABLET | Freq: Every day | ORAL | 3 refills | Status: DC

## 2023-04-01 DIAGNOSIS — M1711 Unilateral primary osteoarthritis, right knee: Secondary | ICD-10-CM | POA: Diagnosis not present

## 2023-04-01 DIAGNOSIS — M170 Bilateral primary osteoarthritis of knee: Secondary | ICD-10-CM | POA: Diagnosis not present

## 2023-04-01 DIAGNOSIS — M1712 Unilateral primary osteoarthritis, left knee: Secondary | ICD-10-CM | POA: Diagnosis not present

## 2023-04-03 ENCOUNTER — Telehealth: Payer: Self-pay | Admitting: Cardiovascular Disease

## 2023-04-03 DIAGNOSIS — C61 Malignant neoplasm of prostate: Secondary | ICD-10-CM | POA: Diagnosis not present

## 2023-04-03 DIAGNOSIS — N5201 Erectile dysfunction due to arterial insufficiency: Secondary | ICD-10-CM | POA: Diagnosis not present

## 2023-04-03 DIAGNOSIS — R3912 Poor urinary stream: Secondary | ICD-10-CM | POA: Diagnosis not present

## 2023-04-03 DIAGNOSIS — N401 Enlarged prostate with lower urinary tract symptoms: Secondary | ICD-10-CM | POA: Diagnosis not present

## 2023-04-03 NOTE — Telephone Encounter (Signed)
I spoke w the patient.  He just read the results from the [employer] on his Zio monitor and saw that it referred to him as "her" and so he questioned if this was definitively his results and not someone else's.    I reviewed the comments on the actual monitor report, comparing them w Peter Brooks's comments and they are the same--therefore confirming this is his monitor.  The patient is satisfied with this information.  He said he was diagnosed w prostate cancer and treatment determinations are up in the air and he wanted to just be sure that the monitor information is accurate.

## 2023-04-03 NOTE — Telephone Encounter (Signed)
Patient is calling asking to speak to the nurse.Please advise.

## 2023-04-04 ENCOUNTER — Other Ambulatory Visit: Payer: Self-pay | Admitting: Physician Assistant

## 2023-04-12 ENCOUNTER — Ambulatory Visit: Payer: PPO | Admitting: Nurse Practitioner

## 2023-04-21 DIAGNOSIS — G4733 Obstructive sleep apnea (adult) (pediatric): Secondary | ICD-10-CM | POA: Diagnosis not present

## 2023-04-26 ENCOUNTER — Ambulatory Visit: Payer: PPO | Admitting: Physician Assistant

## 2023-05-16 ENCOUNTER — Telehealth: Payer: Self-pay | Admitting: Cardiovascular Disease

## 2023-05-16 NOTE — Telephone Encounter (Signed)
Pt c/o BP issue: STAT if pt c/o blurred vision, one-sided weakness or slurred speech  1. What are your last 5 BP readings? 160/98 160/58 180/59  2. Are you having any other symptoms (ex. Dizziness, headache, blurred vision, passed out)? Headache and lightheadedness occasionally.   3. What is your BP issue? Higher than normal BP started yesterday.

## 2023-05-16 NOTE — Telephone Encounter (Signed)
Spoke with patient and he states his BP was elevated yesterday. When he took his BP before his medication it was elevated. After his medication it was down to 124/68. He is anxious about his BP and took it like 7 times this morning. He wanted to know if he needed to increase his amlodipine.  Informed patient that he does not need to check his BP that many times in a day and him being anxious about will increase his BP. He is asymptomatic. Did advise we will need to see a trend of elevated BP. For now to monitor his BP  take once a day and keep log. If its continuously give [place] a call.

## 2023-06-13 ENCOUNTER — Encounter: Payer: Self-pay | Admitting: Cardiovascular Disease

## 2023-06-13 ENCOUNTER — Ambulatory Visit: Payer: PPO | Attending: Cardiovascular Disease | Admitting: Cardiovascular Disease

## 2023-06-13 ENCOUNTER — Other Ambulatory Visit: Payer: Self-pay | Admitting: *Deleted

## 2023-06-13 VITALS — BP 114/70 | HR 65 | Ht 74.0 in | Wt 245.6 lb

## 2023-06-13 DIAGNOSIS — E782 Mixed hyperlipidemia: Secondary | ICD-10-CM

## 2023-06-13 DIAGNOSIS — I5032 Chronic diastolic (congestive) heart failure: Secondary | ICD-10-CM

## 2023-06-13 DIAGNOSIS — I351 Nonrheumatic aortic (valve) insufficiency: Secondary | ICD-10-CM | POA: Diagnosis not present

## 2023-06-13 DIAGNOSIS — Z01812 Encounter for preprocedural laboratory examination: Secondary | ICD-10-CM

## 2023-06-13 DIAGNOSIS — R0602 Shortness of breath: Secondary | ICD-10-CM | POA: Diagnosis not present

## 2023-06-13 DIAGNOSIS — I25110 Atherosclerotic heart disease of native coronary artery with unstable angina pectoris: Secondary | ICD-10-CM

## 2023-06-13 DIAGNOSIS — I10 Essential (primary) hypertension: Secondary | ICD-10-CM | POA: Diagnosis not present

## 2023-06-13 NOTE — Patient Instructions (Addendum)
Medication Instructions:  No changes *If you need a refill on your cardiac medications before your next appointment, please call your pharmacy*   Lab Work: Go to the first floor [facility] for blood work when you return for echo (bmet, cbc)  Testing/Procedures: Your physician has requested that you have a cardiac catheterization. Cardiac catheterization is used to diagnose and/or treat various heart conditions. Doctors may recommend this procedure for a number of different reasons. The most common reason is to evaluate chest pain. Chest pain can be a symptom of coronary artery disease (CAD), and cardiac catheterization can show whether plaque is narrowing or blocking your heart's arteries. This procedure is also used to evaluate the valves, as well as measure the blood flow and oxygen levels in different parts of your heart. For further information please visit [email or web address] Please follow instruction sheet, as given.   Follow-Up: At [facility], you and your health needs are our priority.  As part of our continuing mission to provide you with exceptional heart care, we have created designated Provider Care Teams.  These Care Teams include your primary Cardiologist (physician) and Advanced Practice Providers (APPs -  Physician Assistants and Nurse Practitioners) who all work together to provide you with the care you need, when you need it.   Your next appointment:   6 week(s)  Provider:   Verne Carrow, MD  or Jari Favre, PA-C, Ronie Spies, PA-C, Robin Searing, NP, Jacolyn Reedy, PA-C, Eligha Bridegroom, NP, Tereso Newcomer, PA-C, or Perlie Gold, PA-C        Other Instructions  [facility] A DEPT [facility] AT [facility] [address] [place] [address] Dept: [phone] Loc: [phone]  Peter Brooks  06/13/2023  You are scheduled for a Cardiac Catheterization on Tuesday, February 25  with Dr. Verne Carrow.  1. Please arrive at the [facility] (Main Entrance A) at [facility]: [address] [place], [place] [address] at 7:00 AM (This time is 2 hour(s) before your procedure to ensure your preparation).   Free valet parking service is available. You will check in at ADMITTING. The support person will be asked to wait in the waiting room.  It is OK to have someone drop you off and come back when you are ready to be discharged.    Special note: Every effort is made to have your procedure done on time. Please understand that emergencies sometimes delay scheduled procedures.  2. Diet: Do not eat solid foods after midnight.  The patient may have clear liquids until 5am upon the day of the procedure.  3. Labs: You will need to have blood drawn any time after February 3 at [employer]. You do not need to be fasting.  4. Medication instructions in preparation for your procedure:   Contrast Allergy: No  Stop taking, Lasix (Furosemide)  Tuesday, February 25, Stop taking, Farxiga Tuesday, February 25,  On the morning of your procedure, take your Aspirin 81 mg and any morning medicines NOT listed above.  You may use sips of water.  5. Plan to go home the same day, you will only stay overnight if medically necessary. 6. Bring a current list of your medications and current insurance cards. 7. You MUST have a responsible person to drive you home. 8. Someone MUST be with you the first 24 hours after you arrive home or your discharge will be delayed. 9. Please wear clothes that  are easy to get on and off and wear slip-on shoes.  Thank you for allowing [place] to care for you!   -- [place] [facility]

## 2023-06-13 NOTE — Progress Notes (Signed)
Chief Complaint  Patient presents with   Follow-up    CAD   History of Present Illness: 78 yo male with history of CAD, chronic diastolic CHF, HTN, HLD, GERD and SVT here today for cardiac follow up.  Cardiac cath June 2010 with non-obstructive CAD and LVEF 60%.  He was seen in January 2015 and had c/o heart racing, dizziness, fatigue. Stress myoview 07/27/13 without ischemia. Echo 07/27/13 with normal LV function, no significant valve issues. Event monitor in 2015 without any arrythmias. He called our office in July 2019 with c/o palpitations. Cardiac monitor August 2019 with PVCs, PACs, short run of SVT and 4 beat run of non-sustained VT. Toprol was started. I saw him in the office in October 2019 and he c/o progressive dyspnea and chest pain with exertion. Echo October 2019 with normal LV systolic function. No significant valve disease. Cardiac cath 03/11/18 with severe stenosis in the right PDA treated with a drug eluting stent. Mild non-obstructive disease in the LAD and Circumflex. He was seen via telemedicine visit 01/19/19 and had c/o chest pain with exertion. Nuclear stress test 02/20/19 with no evidence of ischemia. Cardiac monitor September 2020 with sinus, several short runs of SVT. I saw him in May 2021 and he reported increased dyspnea and LE edema. Lasix was started. Echo 10/12/19 with LVEF=60-65%, no valve disease. Nuclear stress test August 2021 with no ischemia. He continued to have chest pain and dyspnea so we arranged a cardiac cath on 02/18/20. This showed a patent right PDA stent, non-obstructive LAD disease and a severe stenosis in the first obtuse marginal branch which was treated with a drug eluting stent. Right heart cath with low filling pressures, PCWP 7, LVEDP 8. Repeat cardiac cath April 2022 with stable CAD. He was seen by Tereso Newcomer, PA-C in May 2022 and c/o ongoing weakness and dizziness. Norvasc was lowered. 14 day cardiac monitor with sinus with 19 runs of SVT, longest 2 minutes  and 22 seconds. PVCs. He was admitted to [facility] in July 2022 after a possible syncopal episode while sitting at home. He was seen by Cardiology. Echo with LVEF=60-65%, no significant valve disease. He was seen in the EP clinic by Dr. Ladona Ridgel and his Toprol was stopped. Amiodarone was started for treatment of his PVCs. Echo 03/07/22 with LVEF=60-65%, mild AI. He was seen in our office in September 2024 by Robin Searing, NP and reported some dyspnea. Imdur was started.   He is here today for follow up. The patient denies any chest pain, dyspnea, palpitations, lower extremity edema, orthopnea, PND, dizziness, near syncope or syncope.    Primary Care Physician: Street, Stephanie Coup, MD  Past Medical History:  Diagnosis Date   (HFpEF) heart failure with preserved ejection fraction (HCC) 02/20/2022   Echo 02/2022: EF 60-65, no RWMA, mild RVE, normal PASP (RVSP 35.2), mild LAE, trivial MR, AV sclerosis without stenosis, mild AI, RAP 8   Anxiety    Arthritis    CAD (coronary artery disease)    LHC 02/18/2020: prior stent to RPDA patent, 90% stenosis of OM1 s/p PTCA/DES, 50% 1st Diag, 50% proximal to mid LAD, 20% distal LAD, 30% ostial to proximal CX, 30% OM2.   Cataract    removed bilaterally    Chest pain    Diabetes mellitus without complication (HCC)    Dizziness    GERD (gastroesophageal reflux disease)    Hyperlipidemia    Hypertension    Hypothyroidism    Lumbar herniated disc  Past Surgical History:  Procedure Laterality Date   CARDIAC CATHETERIZATION  11/11/2008   CATARACT EXTRACTION Right    CATARACT EXTRACTION Left    COLONOSCOPY     CORONARY STENT INTERVENTION N/A 03/11/2018   Procedure: CORONARY STENT INTERVENTION;  Surgeon: Swaziland, Peter M, MD;  Location: [facility] INVASIVE CV LAB;  Service: Cardiovascular;  Laterality: N/A;   CORONARY STENT INTERVENTION N/A 02/18/2020   Procedure: CORONARY STENT INTERVENTION;  Surgeon: Kathleene Hazel, MD;  Location: [facility] INVASIVE CV LAB;   Service: Cardiovascular;  Laterality: N/A;   KNEE ARTHROSCOPY Bilateral 2010 Right   POLYPECTOMY     RIGHT/LEFT HEART CATH AND CORONARY ANGIOGRAPHY N/A 03/11/2018   Procedure: RIGHT/LEFT HEART CATH AND CORONARY ANGIOGRAPHY;  Surgeon: Swaziland, Peter M, MD;  Location: [facility] INVASIVE CV LAB;  Service: Cardiovascular;  Laterality: N/A;   RIGHT/LEFT HEART CATH AND CORONARY ANGIOGRAPHY N/A 02/18/2020   Procedure: RIGHT/LEFT HEART CATH AND CORONARY ANGIOGRAPHY;  Surgeon: Kathleene Hazel, MD;  Location: [facility] INVASIVE CV LAB;  Service: Cardiovascular;  Laterality: N/A;   RIGHT/LEFT HEART CATH AND CORONARY ANGIOGRAPHY N/A 09/06/2020   Procedure: RIGHT/LEFT HEART CATH AND CORONARY ANGIOGRAPHY;  Surgeon: Lyn Records, MD;  Location: [facility] INVASIVE CV LAB;  Service: Cardiovascular;  Laterality: N/A;    Current Outpatient Medications  Medication Sig Dispense Refill   amiodarone (PACERONE) 100 MG tablet Take 1 tablet (100 mg total) by mouth daily. 90 tablet 3   amLODipine (NORVASC) 5 MG tablet Take 1 tablet by mouth once daily 90 tablet 3   ascorbic acid (VITAMIN C) 1000 MG tablet Take by mouth daily.     aspirin EC 81 MG tablet Take 81 mg by mouth every evening.      Blood Glucose Monitoring Suppl (ONE TOUCH ULTRA 2) w/Device KIT See admin instructions.     diazepam (VALIUM) 2 MG tablet Take 1 tablet (2 mg total) by mouth every 12 (twelve) hours as needed for anxiety. 30 tablet 1   diclofenac Sodium (VOLTAREN) 1 % GEL Apply 1 Application topically daily at 12 noon.     FARXIGA 10 MG TABS tablet Take 10 mg by mouth daily.     furosemide (LASIX) 40 MG tablet Take 40 mg by mouth daily.     levothyroxine (SYNTHROID, LEVOTHROID) 50 MCG tablet Take 50 mcg by mouth daily.     Multiple Vitamin (MULITIVITAMIN WITH MINERALS) TABS Take 1 tablet by mouth daily.     Multiple Vitamins-Minerals (PRESERVISION AREDS 2) CAPS Take 1 capsule by mouth 2 (two) times daily.      nitroGLYCERIN (NITROSTAT) 0.4 MG SL tablet Place 1  tablet (0.4 mg total) under the tongue every 5 (five) minutes as needed. 25 tablet 3   ONETOUCH ULTRA test strip USE 1 STRIP TO CHECK GLUCOSE ONCE DAILY     potassium chloride SA (KLOR-CON M) 20 MEQ tablet Take 1 tablet by mouth twice daily (Patient taking differently: daily.) 180 tablet 2   Probiotic Product (PROBIOTIC DAILY PO) Take 1 tablet by mouth daily.     rosuvastatin (CRESTOR) 10 MG tablet Take 10 mg by mouth daily.     isosorbide mononitrate (IMDUR) 30 MG 24 hr tablet Take 0.5 tablets (15 mg total) by mouth daily. CAN TAKE AN ADDITIONAL HALF TAB AS NEEDED FOR PALPITATIONS 30 tablet 3   No current facility-administered medications for this visit.    Allergies  Allergen Reactions   Cefdinir Other (See Comments)    Swelling of the throat    Social  History   Socioeconomic History   Marital status: Married    Spouse name: Not on file   Number of children: 3   Years of education: Not on file   Highest education level: Not on file  Occupational History   Not on file  Tobacco Use   Smoking status: Never   Smokeless tobacco: Never  Vaping Use   Vaping status: Never Used  Substance and Sexual Activity   Alcohol use: No   Drug use: No   Sexual activity: Yes    Comment: married  Other Topics Concern   Not on file  Social History Narrative   Not on file   Social Drivers of Health   Financial Resource Strain: Not on file  Food Insecurity: Not on file  Transportation Needs: Not on file  Physical Activity: Not on file  Stress: Not on file  Social Connections: Unknown (10/03/2021)   Received from [facility]   Social Network    Social Network: Not on file  Intimate Partner Violence: Unknown (08/25/2021)   Received from [facility]   HITS    Physically Hurt: Not on file    Insult or Talk Down To: Not on file    Threaten Physical Harm: Not on file    Scream or Curse: Not on file    Family History  Problem Relation Age of Onset    Colon cancer Paternal Uncle 22   Coronary artery disease Father        with CABG valve replacement in his 63's   Heart attack Mother        in her 23's   Stomach cancer Paternal Grandmother    Esophageal cancer Neg Hx    Rectal cancer Neg Hx    Colon polyps Neg Hx     Review of Systems:  As stated in the HPI and otherwise negative.   BP 114/70   Pulse 65   Ht 6\' 2"  (1.88 m)   Wt 111.4 kg   SpO2 99%   BMI 31.53 kg/m   Physical Examination: General: Well developed, well nourished, NAD  HEENT: OP clear, mucus membranes moist  SKIN: warm, dry. No rashes. Neuro: No focal deficits  Musculoskeletal: Muscle strength 5/5 all ext  Psychiatric: Mood and affect normal  Neck: No JVD, no carotid bruits, no thyromegaly, no lymphadenopathy.  Lungs:Clear bilaterally, no wheezes, rhonci, crackles Cardiovascular: Regular rate and rhythm. No murmurs, gallops or rubs. Abdomen:Soft. Bowel sounds present. Non-tender.  Extremities: No lower extremity edema. Pulses are 2 + in the bilateral DP/PT.  EKG:  EKG is not ordered today. The ekg ordered today demonstrates   Recent Labs: 02/15/2023: BUN 17; Creatinine, Ser 1.38; Hemoglobin 15.5; Platelets 287; Potassium 4.5; Sodium 143   Lipid Panel Lipid Panel     Component Value Date/Time   CHOL 106 07/18/2021 1032   TRIG 132 07/18/2021 1032   HDL 37 (L) 07/18/2021 1032   CHOLHDL 2.9 07/18/2021 1032   CHOLHDL 3.6 08/09/2011 0710   VLDL 21 08/09/2011 0710   LDLCALC 46 07/18/2021 1032   LABVLDL 23 07/18/2021 1032      Wt Readings from Last 3 Encounters:  06/13/23 111.4 kg  02/15/23 113.4 kg  06/18/22 109.9 kg   Assessment and Plan:   1. CAD with unstable angina: He is having progressive dyspnea with exertion and mild chest pains. Cannot exclude angina. He has no evidence of volume overload, weight is down. Will plan right and left heart  cath at [facility] on 07/16/23 at 9am.  I have reviewed the risks, indications, and alternatives to cardiac  catheterization, possible angioplasty, and stenting with the patient. Risks include but are not limited to bleeding, infection, vascular injury, stroke, myocardial infection, arrhythmia, kidney injury, radiation-related injury in the case of prolonged fluoroscopy use, emergency cardiac surgery, and death. The patient understands the risks of serious complication is 1-2 in 1000 with diagnostic cardiac cath and 1-2% or less with angioplasty/stenting. -Continue ASA, statin and Imdur. Beta blocker stopped due to fatigue.   2. HTN: BP is well controlled. No changes  3. HLD:  Lipids followed in primary care. Continue statin.    4. PVCs/PACs/SVT: No palpitations. Continue amiodarone.   5. Chronic diastolic CHF: LV systolic function normal by echo in October 2023. No volume overload on exam. Weight is down 5 lbs. I do not think his dyspnea is due to CHF. Will repeat echo now to assess LV function.   6. Aortic valve insufficiency: Mild by echo in 2023. Repeat echo now.   Labs/ tests ordered today include:   Orders Placed This Encounter  Procedures   Basic metabolic panel   CBC   ECHOCARDIOGRAM COMPLETE   Disposition:   Follow up me in 12 months.   Signed, Verne Carrow, MD 06/13/2023 4:21 PM    [facility] [address] [place], [place], [place]  [address] Phone: [phone]; Fax: [phone]

## 2023-07-01 ENCOUNTER — Telehealth: Payer: Self-pay | Admitting: Cardiovascular Disease

## 2023-07-01 NOTE — Telephone Encounter (Signed)
 Spoke w patient. He wanted to talk about cancelling the heart cath for now so that he can mentally and physically deal with his prostate cancer work up.  He was diagnosed w prostate cancer a couple months ago and is having new symptoms.  He is seeing urology next week and expects an MRI will be done and based on that potentially another biopsy.    I explained that he will likely need cleared from cardiac perspective again for the next (potential) biopsy, since he discussed symptoms at last ov last week.  He voiced being overwhelmed by having all the procedures and if he has to choose he would choose working on fixing the cancer so that it doesn't spread.   He is still planning to have the echocardiogram scheduled tomorrow.  He asked me to talk this over with Dr. Abel Hoe and get back to him with what the doctor thinks.

## 2023-07-01 NOTE — Telephone Encounter (Signed)
  The patient stated that he has been dealing with prostate cancer. He has an appointment with his urologist on 07/10/23 and was informed that he may need an MRI and biopsy. He is considering canceling his heart catheterization procedure to learn more about his cancer. He is requesting to speak with Peter Brooks to discuss this further

## 2023-07-01 NOTE — Telephone Encounter (Signed)
 We can postpone the cath for now and proceed with the echo. Thanks, Peter Brooks  _____________________________________________________  Liana Reding and spoke with the patient.  Provided recommendation above.  Pt in agreement and voiced appreciation for assistance.  Called cath lab and cancelled the cath for 07/16/23.  He has kept his office follow up for 3/17 for now and will cancel closer to time if not needed.

## 2023-07-02 ENCOUNTER — Ambulatory Visit (HOSPITAL_COMMUNITY): Payer: PPO | Attending: Internal Medicine

## 2023-07-02 DIAGNOSIS — R0602 Shortness of breath: Secondary | ICD-10-CM | POA: Diagnosis not present

## 2023-07-02 DIAGNOSIS — I351 Nonrheumatic aortic (valve) insufficiency: Secondary | ICD-10-CM | POA: Diagnosis not present

## 2023-07-02 LAB — ECHOCARDIOGRAM COMPLETE
AV Vena cont: 0.19 cm
Area-P 1/2: 1.57 cm2
S' Lateral: 3.27 cm

## 2023-07-03 DIAGNOSIS — C61 Malignant neoplasm of prostate: Secondary | ICD-10-CM | POA: Diagnosis not present

## 2023-07-10 DIAGNOSIS — N401 Enlarged prostate with lower urinary tract symptoms: Secondary | ICD-10-CM | POA: Diagnosis not present

## 2023-07-10 DIAGNOSIS — R102 Pelvic and perineal pain: Secondary | ICD-10-CM | POA: Diagnosis not present

## 2023-07-10 DIAGNOSIS — R3912 Poor urinary stream: Secondary | ICD-10-CM | POA: Diagnosis not present

## 2023-07-10 DIAGNOSIS — C61 Malignant neoplasm of prostate: Secondary | ICD-10-CM | POA: Diagnosis not present

## 2023-07-16 ENCOUNTER — Encounter (HOSPITAL_COMMUNITY): Admission: RE | Payer: Self-pay | Source: Home / Self Care

## 2023-07-16 ENCOUNTER — Ambulatory Visit (HOSPITAL_COMMUNITY): Admission: RE | Admit: 2023-07-16 | Payer: PPO | Source: Home / Self Care | Admitting: Cardiovascular Disease

## 2023-07-16 SURGERY — RIGHT/LEFT HEART CATH AND CORONARY ANGIOGRAPHY
Anesthesia: LOCAL

## 2023-07-22 DIAGNOSIS — G4733 Obstructive sleep apnea (adult) (pediatric): Secondary | ICD-10-CM | POA: Diagnosis not present

## 2023-08-01 DIAGNOSIS — R059 Cough, unspecified: Secondary | ICD-10-CM | POA: Diagnosis not present

## 2023-08-01 DIAGNOSIS — R0981 Nasal congestion: Secondary | ICD-10-CM | POA: Diagnosis not present

## 2023-08-05 ENCOUNTER — Ambulatory Visit: Payer: PPO | Admitting: Cardiovascular Disease

## 2023-08-19 ENCOUNTER — Ambulatory Visit: Attending: Cardiovascular Disease | Admitting: Cardiovascular Disease

## 2023-08-19 ENCOUNTER — Encounter: Payer: Self-pay | Admitting: Cardiovascular Disease

## 2023-08-19 VITALS — BP 124/76 | HR 72 | Ht 74.0 in | Wt 250.2 lb

## 2023-08-19 DIAGNOSIS — E782 Mixed hyperlipidemia: Secondary | ICD-10-CM | POA: Diagnosis not present

## 2023-08-19 DIAGNOSIS — I351 Nonrheumatic aortic (valve) insufficiency: Secondary | ICD-10-CM | POA: Diagnosis not present

## 2023-08-19 DIAGNOSIS — I25118 Atherosclerotic heart disease of native coronary artery with other forms of angina pectoris: Secondary | ICD-10-CM | POA: Diagnosis not present

## 2023-08-19 DIAGNOSIS — I10 Essential (primary) hypertension: Secondary | ICD-10-CM

## 2023-08-19 DIAGNOSIS — I493 Ventricular premature depolarization: Secondary | ICD-10-CM

## 2023-08-19 NOTE — Patient Instructions (Signed)
 Medication Instructions:  No changes *If you need a refill on your cardiac medications before your next appointment, please call your pharmacy*  Lab Work: none If you have labs (blood work) drawn today and your tests are completely normal, you will receive your results only by: MyChart Message (if you have MyChart) OR A paper copy in the mail If you have any lab test that is abnormal or we need to change your treatment, we will call you to review the results.  Testing/Procedures: none  Follow-Up: At [facility], you and your health needs are our priority.  As part of our continuing mission to provide you with exceptional heart care, our providers are all part of one team.  This team includes your primary Cardiologist (physician) and Advanced Practice Providers or APPs (Physician Assistants and Nurse Practitioners) who all work together to provide you with the care you need, when you need it.  Your next appointment:   12 month(s)  Provider:   Verne Carrow, MD     We recommend signing up for the patient portal called "MyChart".  Sign up information is provided on this After Visit Summary.  MyChart is used to connect with patients for Virtual Visits (Telemedicine).  Patients are able to view lab/test results, encounter notes, upcoming appointments, etc.  Non-urgent messages can be sent to your provider as well.   To learn more about what you can do with MyChart, go to [email or web address].   Other Instructions       1st Floor: - Lobby - Registration  - Pharmacy  - Lab - Cafe  2nd Floor: - PV Lab - Diagnostic Testing (echo, CT, nuclear med)  3rd Floor: - Vacant  4th Floor: - TCTS (cardiothoracic surgery) - AFib Clinic - Structural Heart Clinic - Vascular Surgery  - Vascular Ultrasound  5th Floor: - HeartCare Cardiology (general and EP) - Clinical Pharmacy for coumadin, hypertension, lipid, weight-loss medications, and med management  appointments    Valet parking services will be available as well.

## 2023-08-19 NOTE — Progress Notes (Signed)
 Chief Complaint  Patient presents with   Follow-up    CAD   History of Present Illness: 78 yo male with history of CAD, chronic diastolic CHF, HTN, HLD, GERD and SVT here today for cardiac follow up.  Cardiac cath June 2010 with non-obstructive CAD and LVEF 60%.  He was seen in January 2015 and had c/o heart racing, dizziness, fatigue. Stress myoview 07/27/13 without ischemia. Echo 07/27/13 with normal LV function, no significant valve issues. Event monitor in 2015 without any arrythmias. He called our office in July 2019 with c/o palpitations. Cardiac monitor August 2019 with PVCs, PACs, short run of SVT and 4 beat run of non-sustained VT. Toprol was started. I saw him in the office in October 2019 and he c/o progressive dyspnea and chest pain with exertion. Echo October 2019 with normal LV systolic function. No significant valve disease. Cardiac cath 03/11/18 with severe stenosis in the right PDA treated with a drug eluting stent. Mild non-obstructive disease in the LAD and Circumflex. He was seen via telemedicine visit 01/19/19 and had c/o chest pain with exertion. Nuclear stress test 02/20/19 with no evidence of ischemia. Cardiac monitor September 2020 with sinus, several short runs of SVT. I saw him in May 2021 and he reported increased dyspnea and LE edema. Lasix was started. Echo 10/12/19 with LVEF=60-65%, no valve disease. Nuclear stress test August 2021 with no ischemia. He continued to have chest pain and dyspnea so we arranged a cardiac cath on 02/18/20. This showed a patent right PDA stent, non-obstructive LAD disease and a severe stenosis in the first obtuse marginal branch which was treated with a drug eluting stent. Right heart cath with low filling pressures, PCWP 7, LVEDP 8. Repeat cardiac cath April 2022 with stable CAD. He was seen by Tereso Newcomer, PA-C in May 2022 and c/o ongoing weakness and dizziness. Norvasc was lowered. 14 day cardiac monitor with sinus with 19 runs of SVT, longest 2 minutes  and 22 seconds. PVCs. He was admitted to [facility] in July 2022 after a possible syncopal episode while sitting at home. He was seen by Cardiology. Echo with LVEF=60-65%, no significant valve disease. He was seen in the EP clinic by Dr. Ladona Ridgel and his Toprol was stopped. Amiodarone was started for treatment of his PVCs. Echo 03/07/22 with LVEF=60-65%, mild AI. He was seen in our office in September 2024 by Robin Searing, NP and reported some dyspnea. Imdur was started. I saw him in January 2025 and he c/o ongoing dyspnea and chest pain. Cardiac cath was planned but he cancelled this as he wanted to focus on his workup for prostate cancer. He is probably going to pursue radiation. Echo 07/02/23 with LVEF=60-65%. Mild AI.   He is here today for follow up. The patient denies any chest pain, dyspnea, palpitations, lower extremity edema, orthopnea, PND, dizziness, near syncope or syncope. He is feeling well overall. He is now retired and is busy around his house.    Primary Care Physician: Street, Stephanie Coup, MD  Past Medical History:  Diagnosis Date   (HFpEF) heart failure with preserved ejection fraction (HCC) 02/20/2022   Echo 02/2022: EF 60-65, no RWMA, mild RVE, normal PASP (RVSP 35.2), mild LAE, trivial MR, AV sclerosis without stenosis, mild AI, RAP 8   Anxiety    Arthritis    CAD (coronary artery disease)    LHC 02/18/2020: prior stent to RPDA patent, 90% stenosis of OM1 s/p PTCA/DES, 50% 1st Diag, 50% proximal to mid LAD, 20%  distal LAD, 30% ostial to proximal CX, 30% OM2.   Cataract    removed bilaterally    Chest pain    Diabetes mellitus without complication (HCC)    Dizziness    GERD (gastroesophageal reflux disease)    Hyperlipidemia    Hypertension    Hypothyroidism    Lumbar herniated disc     Past Surgical History:  Procedure Laterality Date   CARDIAC CATHETERIZATION  11/11/2008   CATARACT EXTRACTION Right    CATARACT EXTRACTION Left    COLONOSCOPY     CORONARY STENT  INTERVENTION N/A 03/11/2018   Procedure: CORONARY STENT INTERVENTION;  Surgeon: Swaziland, Peter M, MD;  Location: [facility] INVASIVE CV LAB;  Service: Cardiovascular;  Laterality: N/A;   CORONARY STENT INTERVENTION N/A 02/18/2020   Procedure: CORONARY STENT INTERVENTION;  Surgeon: Kathleene Hazel, MD;  Location: [facility] INVASIVE CV LAB;  Service: Cardiovascular;  Laterality: N/A;   KNEE ARTHROSCOPY Bilateral 2010 Right   POLYPECTOMY     RIGHT/LEFT HEART CATH AND CORONARY ANGIOGRAPHY N/A 03/11/2018   Procedure: RIGHT/LEFT HEART CATH AND CORONARY ANGIOGRAPHY;  Surgeon: Swaziland, Peter M, MD;  Location: [facility] INVASIVE CV LAB;  Service: Cardiovascular;  Laterality: N/A;   RIGHT/LEFT HEART CATH AND CORONARY ANGIOGRAPHY N/A 02/18/2020   Procedure: RIGHT/LEFT HEART CATH AND CORONARY ANGIOGRAPHY;  Surgeon: Kathleene Hazel, MD;  Location: [facility] INVASIVE CV LAB;  Service: Cardiovascular;  Laterality: N/A;   RIGHT/LEFT HEART CATH AND CORONARY ANGIOGRAPHY N/A 09/06/2020   Procedure: RIGHT/LEFT HEART CATH AND CORONARY ANGIOGRAPHY;  Surgeon: Lyn Records, MD;  Location: [facility] INVASIVE CV LAB;  Service: Cardiovascular;  Laterality: N/A;    Current Outpatient Medications  Medication Sig Dispense Refill   amiodarone (PACERONE) 100 MG tablet Take 1 tablet (100 mg total) by mouth daily. 90 tablet 3   amLODipine (NORVASC) 5 MG tablet Take 1 tablet by mouth once daily 90 tablet 3   ascorbic acid (VITAMIN C) 1000 MG tablet Take by mouth daily.     aspirin EC 81 MG tablet Take 81 mg by mouth every evening.      Blood Glucose Monitoring Suppl (ONE TOUCH ULTRA 2) w/Device KIT See admin instructions.     diazepam (VALIUM) 2 MG tablet Take 1 tablet (2 mg total) by mouth every 12 (twelve) hours as needed for anxiety. 30 tablet 1   diclofenac Sodium (VOLTAREN) 1 % GEL Apply 1 Application topically daily at 12 noon.     FARXIGA 10 MG TABS tablet Take 10 mg by mouth daily.     furosemide (LASIX) 40 MG tablet Take 40 mg by mouth  daily.     isosorbide mononitrate (IMDUR) 30 MG 24 hr tablet Take 0.5 tablets (15 mg total) by mouth daily. CAN TAKE AN ADDITIONAL HALF TAB AS NEEDED FOR PALPITATIONS 30 tablet 3   levothyroxine (SYNTHROID, LEVOTHROID) 50 MCG tablet Take 50 mcg by mouth daily.     Multiple Vitamin (MULITIVITAMIN WITH MINERALS) TABS Take 1 tablet by mouth daily.     Multiple Vitamins-Minerals (PRESERVISION AREDS 2) CAPS Take 1 capsule by mouth 2 (two) times daily.      nitroGLYCERIN (NITROSTAT) 0.4 MG SL tablet Place 1 tablet (0.4 mg total) under the tongue every 5 (five) minutes as needed. 25 tablet 3   ONETOUCH ULTRA test strip USE 1 STRIP TO CHECK GLUCOSE ONCE DAILY     potassium chloride SA (KLOR-CON M) 20 MEQ tablet Take 1 tablet by mouth twice daily (Patient taking differently: daily.) 180 tablet 2  Probiotic Product (PROBIOTIC DAILY PO) Take 1 tablet by mouth daily.     rosuvastatin (CRESTOR) 20 MG tablet Take 20 mg by mouth daily.     tamsulosin (FLOMAX) 0.4 MG CAPS capsule Take 0.4 mg by mouth daily.     No current facility-administered medications for this visit.    Allergies  Allergen Reactions   Cefdinir Other (See Comments)    Swelling of the throat    Social History   Socioeconomic History   Marital status: Married    Spouse name: Not on file   Number of children: 3   Years of education: Not on file   Highest education level: Not on file  Occupational History   Not on file  Tobacco Use   Smoking status: Never   Smokeless tobacco: Never  Vaping Use   Vaping status: Never Used  Substance and Sexual Activity   Alcohol use: No   Drug use: No   Sexual activity: Yes    Comment: married  Other Topics Concern   Not on file  Social History Narrative   Not on file   Social Drivers of Health   Financial Resource Strain: Not on file  Food Insecurity: Not on file  Transportation Needs: Not on file  Physical Activity: Not on file  Stress: Not on file  Social Connections: Unknown  (10/03/2021)   Received from [facility], [facility]   Social Network    Social Network: Not on file  Intimate Partner Violence: Unknown (08/25/2021)   Received from [facility], [facility]   HITS    Physically Hurt: Not on file    Insult or Talk Down To: Not on file    Threaten Physical Harm: Not on file    Scream or Curse: Not on file    Family History  Problem Relation Age of Onset   Colon cancer Paternal Uncle 61   Coronary artery disease Father        with CABG valve replacement in his 75's   Heart attack Mother        in her 82's   Stomach cancer Paternal Grandmother    Esophageal cancer Neg Hx    Rectal cancer Neg Hx    Colon polyps Neg Hx     Review of Systems:  As stated in the HPI and otherwise negative.   BP 124/76   Pulse 72   Ht 6\' 2"  (1.88 m)   Wt 113.5 kg   SpO2 96%   BMI 32.13 kg/m   Physical Examination: General: Well developed, well nourished, NAD  HEENT: OP clear, mucus membranes moist  SKIN: warm, dry. No rashes. Neuro: No focal deficits  Musculoskeletal: Muscle strength 5/5 all ext  Psychiatric: Mood and affect normal  Neck: No JVD, no carotid bruits, no thyromegaly, no lymphadenopathy.  Lungs:Clear bilaterally, no wheezes, rhonci, crackles Cardiovascular: Regular rate and rhythm. No murmurs, gallops or rubs. Abdomen:Soft. Bowel sounds present. Non-tender.  Extremities: No lower extremity edema. Pulses are 2 + in the bilateral DP/PT.  EKG:  EKG is ordered today. The ekg ordered today demonstrates  EKG Interpretation Date/Time:  Monday August 19 2023 15:00:10 EDT Ventricular Rate:  64 PR Interval:  182 QRS Duration:  130 QT Interval:  450 QTC Calculation: 464 R Axis:   -36  Text Interpretation: Normal sinus rhythm Left axis deviation Non-specific intra-ventricular conduction block Confirmed by Verne Carrow [phone]) on 08/19/2023 3:02:31 PM    Recent Labs: 02/15/2023: BUN 17; Creatinine, Ser 1.38; Hemoglobin  15.5;  Platelets 287; Potassium 4.5; Sodium 143   Lipid Panel Lipid Panel     Component Value Date/Time   CHOL 106 07/18/2021 1032   TRIG 132 07/18/2021 1032   HDL 37 (L) 07/18/2021 1032   CHOLHDL 2.9 07/18/2021 1032   CHOLHDL 3.6 08/09/2011 0710   VLDL 21 08/09/2011 0710   LDLCALC 46 07/18/2021 1032   LABVLDL 23 07/18/2021 1032      Wt Readings from Last 3 Encounters:  08/19/23 113.5 kg  06/13/23 111.4 kg  02/15/23 113.4 kg   Assessment and Plan:   1. CAD with stable angina: No change in chronic chest pain. Will continue ASA, statin and Imdur. Beta blocker stopped due to fatigue.   2. HTN: BP is controlled. Continue current therapy  3. HLD:  Lipids followed in primary care. Well controlled per pt. Continue statin.     4. PVCs/PACs/SVT: No recent palpitations. Continue amiodarone.   5. Chronic diastolic CHF: LV systolic function normal by echo in February 2025. Wt is stable. No volume overload on exam.   6. Aortic valve insufficiency: Mild by echo in February 2025.   Labs/ tests ordered today include:   Orders Placed This Encounter  Procedures   EKG 12-Lead   Disposition:   Follow up me in 12 months.   Signed, Verne Carrow, MD 08/19/2023 4:01 PM    [facility] [address] [place], [place], [place]  [address] Phone: [phone]; Fax: [phone]

## 2023-08-22 DIAGNOSIS — M170 Bilateral primary osteoarthritis of knee: Secondary | ICD-10-CM | POA: Diagnosis not present

## 2023-09-09 ENCOUNTER — Ambulatory Visit (HOSPITAL_BASED_OUTPATIENT_CLINIC_OR_DEPARTMENT_OTHER): Admission: EM | Admit: 2023-09-09 | Discharge: 2023-09-09 | Disposition: A | Discharge status: Home / Self Care

## 2023-09-09 ENCOUNTER — Encounter (HOSPITAL_BASED_OUTPATIENT_CLINIC_OR_DEPARTMENT_OTHER): Payer: Self-pay

## 2023-09-09 DIAGNOSIS — J309 Allergic rhinitis, unspecified: Secondary | ICD-10-CM | POA: Diagnosis not present

## 2023-09-09 NOTE — ED Provider Notes (Signed)
 [facility]    CSN: [id number] Arrival date & time: 09/09/23  1051      History   Chief Complaint Chief Complaint  Patient presents with   Nasal Congestion   Sore Throat    HPI Peter Brooks is a 78 y.o. male.   78 year old male that presents with sore throat, nasal congestion and drainage.  This has been present and worsening since yesterday.  He did mow the yard on Saturday.  Denies any known history of seasonal allergies.  Denies any fevers, chills, body aches or night sweats.  Had COVID approximately 1 month ago   Sore Throat    Past Medical History:  Diagnosis Date   (HFpEF) heart failure with preserved ejection fraction (HCC) 02/20/2022   Echo 02/2022: EF 60-65, no RWMA, mild RVE, normal PASP (RVSP 35.2), mild LAE, trivial MR, AV sclerosis without stenosis, mild AI, RAP 8   Anxiety    Arthritis    CAD (coronary artery disease)    LHC 02/18/2020: prior stent to RPDA patent, 90% stenosis of OM1 s/p PTCA/DES, 50% 1st Diag, 50% proximal to mid LAD, 20% distal LAD, 30% ostial to proximal CX, 30% OM2.   Cataract    removed bilaterally    Chest pain    Diabetes mellitus without complication (HCC)    Dizziness    GERD (gastroesophageal reflux disease)    Hyperlipidemia    Hypertension    Hypothyroidism    Lumbar herniated disc     Patient Active Problem List   Diagnosis Date Noted   (HFpEF) heart failure with preserved ejection fraction (HCC) 02/20/2022   PVC's (premature ventricular contractions) 06/13/2021   Palpitations 04/10/2021   Syncope 12/04/2020   Syncope and collapse 12/04/2020   Shortness of breath    Type 2 diabetes mellitus with complication, without long-term current use of insulin (HCC) 02/18/2020   Hypothyroidism 02/18/2020   Unstable angina (HCC)    Vertigo 03/19/2018   NSVT (nonsustained ventricular tachycardia) (HCC) 03/18/2018   Chest pain 08/09/2011   DIZZINESS 11/30/2008   HYPERCHOLESTEROLEMIA 10/28/2008   Hyperlipidemia  10/28/2008   HYPERTENSION, BENIGN 10/28/2008   Essential hypertension 10/28/2008   CAD (coronary artery disease) 10/28/2008   GERD 10/28/2008   CHEST PAIN-PRECORDIAL 10/28/2008    Past Surgical History:  Procedure Laterality Date   CARDIAC CATHETERIZATION  11/11/2008   CATARACT EXTRACTION Right    CATARACT EXTRACTION Left    COLONOSCOPY     CORONARY STENT INTERVENTION N/A 03/11/2018   Procedure: CORONARY STENT INTERVENTION;  Surgeon: Swaziland, Peter M, MD;  Location: [facility] INVASIVE CV LAB;  Service: Cardiovascular;  Laterality: N/A;   CORONARY STENT INTERVENTION N/A 02/18/2020   Procedure: CORONARY STENT INTERVENTION;  Surgeon: Odie Benne, MD;  Location: [facility] INVASIVE CV LAB;  Service: Cardiovascular;  Laterality: N/A;   KNEE ARTHROSCOPY Bilateral 2010 Right   POLYPECTOMY     RIGHT/LEFT HEART CATH AND CORONARY ANGIOGRAPHY N/A 03/11/2018   Procedure: RIGHT/LEFT HEART CATH AND CORONARY ANGIOGRAPHY;  Surgeon: Swaziland, Peter M, MD;  Location: [facility] INVASIVE CV LAB;  Service: Cardiovascular;  Laterality: N/A;   RIGHT/LEFT HEART CATH AND CORONARY ANGIOGRAPHY N/A 02/18/2020   Procedure: RIGHT/LEFT HEART CATH AND CORONARY ANGIOGRAPHY;  Surgeon: Odie Benne, MD;  Location: [facility] INVASIVE CV LAB;  Service: Cardiovascular;  Laterality: N/A;   RIGHT/LEFT HEART CATH AND CORONARY ANGIOGRAPHY N/A 09/06/2020   Procedure: RIGHT/LEFT HEART CATH AND CORONARY ANGIOGRAPHY;  Surgeon: Arty Binning, MD;  Location: [facility] INVASIVE CV  LAB;  Service: Cardiovascular;  Laterality: N/A;       Home Medications    Prior to Admission medications   Medication Sig Start Date End Date Taking? Authorizing Provider  amiodarone  (PACERONE ) 100 MG tablet Take 1 tablet (100 mg total) by mouth daily. 03/20/23   Gerald Kitty., NP  amLODipine  (NORVASC ) 5 MG tablet Take 1 tablet by mouth once daily 04/04/23   Dick, Ernest H Jr., NP  ascorbic acid  (VITAMIN C) 1000 MG tablet Take by mouth daily. 04/06/19   [provider]  aspirin  EC 81 MG tablet Take 81 mg by mouth every evening.     [provider]  Blood Glucose Monitoring Suppl (ONE TOUCH ULTRA 2) w/Device KIT See admin instructions. 12/08/18   [provider]  diazepam  (VALIUM ) 2 MG tablet Take 1 tablet (2 mg total) by mouth every 12 (twelve) hours as needed for anxiety. 07/11/15   Odie Benne, MD  diclofenac Sodium (VOLTAREN) 1 % GEL Apply 1 Application topically daily at 12 noon.    [provider]  FARXIGA 10 MG TABS tablet Take 10 mg by mouth daily. 01/19/22   [provider]  furosemide  (LASIX ) 40 MG tablet Take 40 mg by mouth daily.    [provider]  isosorbide  mononitrate (IMDUR ) 30 MG 24 hr tablet Take 0.5 tablets (15 mg total) by mouth daily. CAN TAKE AN ADDITIONAL HALF TAB AS NEEDED FOR PALPITATIONS 02/15/23   Gerald Kitty., NP  levothyroxine  (SYNTHROID , LEVOTHROID) 50 MCG tablet Take 50 mcg by mouth daily.    [provider]  Multiple Vitamin (MULITIVITAMIN WITH MINERALS) TABS Take 1 tablet by mouth daily.    [provider]  Multiple Vitamins-Minerals (PRESERVISION AREDS 2) CAPS Take 1 capsule by mouth 2 (two) times daily.     [provider]  nitroGLYCERIN  (NITROSTAT ) 0.4 MG SL tablet Place 1 tablet (0.4 mg total) under the tongue every 5 (five) minutes as needed. 02/18/20   Goodrich, Callie E, PA-C  ONETOUCH ULTRA test strip USE 1 STRIP TO CHECK GLUCOSE ONCE DAILY 12/08/18   [provider]  potassium chloride  SA (KLOR-CON  M) 20 MEQ tablet Take 1 tablet by mouth twice daily Patient taking differently: daily. 10/31/22   Odie Benne, MD  Probiotic Product (PROBIOTIC DAILY PO) Take 1 tablet by mouth daily.    [provider]  rosuvastatin  (CRESTOR ) 20 MG tablet Take 20 mg by mouth daily.    [provider]  tamsulosin (FLOMAX) 0.4 MG CAPS capsule Take 0.4 mg by mouth daily. 07/10/23   [provider]     Family History Family History  Problem Relation Age of Onset   Colon cancer Paternal Uncle 56   Coronary artery disease Father        with CABG valve replacement in his 69's   Heart attack Mother        in her 65's   Stomach cancer Paternal Grandmother    Esophageal cancer Neg Hx    Rectal cancer Neg Hx    Colon polyps Neg Hx     Social History Social History   Tobacco Use   Smoking status: Never   Smokeless tobacco: Never  Vaping Use   Vaping status: Never Used  Substance Use Topics   Alcohol use: No   Drug use: No     Allergies   Cefdinir   Review of Systems Review of Systems See HPI  Physical Exam Triage Vital  Signs ED Triage Vitals  Encounter Vitals Group     BP 09/09/23 1130 121/64     Systolic BP Percentile --      Diastolic BP Percentile --      Pulse Rate 09/09/23 1130 68     Resp 09/09/23 1130 20     Temp 09/09/23 1130 98.6 F (37 C)     Temp Source 09/09/23 1130 Oral     SpO2 09/09/23 1130 99 %     Weight --      Height --      Head Circumference --      Peak Flow --      Pain Score 09/09/23 1132 7     Pain Loc --      Pain Education --      Exclude from Growth Chart --    No data found.  Updated Vital Signs BP 121/64 (BP Location: Right Arm)   Pulse 68   Temp 98.6 F (37 C) (Oral)   Resp 20   SpO2 99%   Visual Acuity Right Eye Distance:   Left Eye Distance:   Bilateral Distance:    Right Eye Near:   Left Eye Near:    Bilateral Near:     Physical Exam Vitals and nursing note reviewed.  Constitutional:      General: He is not in acute distress.    Appearance: He is well-developed. He is not ill-appearing, toxic-appearing or diaphoretic.  HENT:     Right Ear: Tympanic membrane and ear canal normal.     Left Ear: Tympanic membrane and ear canal normal.     Nose: Congestion and rhinorrhea present.     Mouth/Throat:     Pharynx: Oropharynx is clear.  Eyes:     Conjunctiva/sclera: Conjunctivae normal.  Cardiovascular:      Rate and Rhythm: Normal rate and regular rhythm.  Pulmonary:     Effort: Pulmonary effort is normal.     Breath sounds: Normal breath sounds.  Musculoskeletal:        General: Normal range of motion.     Cervical back: Normal range of motion.  Skin:    General: Skin is warm and dry.  Neurological:     Mental Status: He is alert.  Psychiatric:        Mood and Affect: Mood normal.      UC Treatments / Results  Labs (all labs ordered are listed, but only abnormal results are displayed) Labs Reviewed - No data to display  EKG   Radiology No results found.  Procedures Procedures (including critical care time)  Medications Ordered in UC Medications - No data to display  Initial Impression / Assessment and Plan / UC Course  I have reviewed the triage vital signs and the nursing notes.  Pertinent labs & imaging results that were available during my care of the patient were reviewed by me and considered in my medical decision making (see chart for details).     Allergic rhinitis-likely the cause of his symptoms.  Recommend Zyrtec and Flonase.  Can also do Mucinex for congestion as needed. Follow-up for any continued or worsening issues Final Clinical Impressions(s) / UC Diagnoses   Final diagnoses:  Allergic rhinitis, unspecified seasonality, unspecified trigger     Discharge Instructions      I believe that your symptoms may be due to allergies.  Recommend starting Flonase nasal spray and Zyrtec daily over-the-counter.  You can also do Mucinex for congestion as needed.  If symptoms are not improving or worsening by the weekend please let us  know    ED Prescriptions   None    PDMP not reviewed this encounter.   Landa Pine, FNP 09/09/23 1521

## 2023-09-09 NOTE — Discharge Instructions (Addendum)
 I believe that your symptoms may be due to allergies.  Recommend starting Flonase nasal spray and Zyrtec daily over-the-counter.  You can also do Mucinex for congestion as needed.  If symptoms are not improving or worsening by the weekend please let us  know

## 2023-09-09 NOTE — ED Triage Notes (Signed)
 Dx with covid in November. Sore throat and sinus congestion/drainage yesterday but worse today.

## 2023-09-24 DIAGNOSIS — M170 Bilateral primary osteoarthritis of knee: Secondary | ICD-10-CM | POA: Diagnosis not present

## 2023-10-01 DIAGNOSIS — M170 Bilateral primary osteoarthritis of knee: Secondary | ICD-10-CM | POA: Diagnosis not present

## 2023-10-02 DIAGNOSIS — C61 Malignant neoplasm of prostate: Secondary | ICD-10-CM | POA: Diagnosis not present

## 2023-10-08 DIAGNOSIS — M170 Bilateral primary osteoarthritis of knee: Secondary | ICD-10-CM | POA: Diagnosis not present

## 2023-10-09 DIAGNOSIS — C61 Malignant neoplasm of prostate: Secondary | ICD-10-CM | POA: Diagnosis not present

## 2023-10-09 DIAGNOSIS — R351 Nocturia: Secondary | ICD-10-CM | POA: Diagnosis not present

## 2023-10-09 DIAGNOSIS — R3914 Feeling of incomplete bladder emptying: Secondary | ICD-10-CM | POA: Diagnosis not present

## 2023-10-09 DIAGNOSIS — N401 Enlarged prostate with lower urinary tract symptoms: Secondary | ICD-10-CM | POA: Diagnosis not present

## 2023-10-22 DIAGNOSIS — G4733 Obstructive sleep apnea (adult) (pediatric): Secondary | ICD-10-CM | POA: Diagnosis not present

## 2023-11-27 DIAGNOSIS — E1169 Type 2 diabetes mellitus with other specified complication: Secondary | ICD-10-CM | POA: Diagnosis not present

## 2023-11-27 DIAGNOSIS — E785 Hyperlipidemia, unspecified: Secondary | ICD-10-CM | POA: Diagnosis not present

## 2023-11-27 DIAGNOSIS — H65193 Other acute nonsuppurative otitis media, bilateral: Secondary | ICD-10-CM | POA: Diagnosis not present

## 2023-11-27 DIAGNOSIS — R972 Elevated prostate specific antigen [PSA]: Secondary | ICD-10-CM | POA: Diagnosis not present

## 2023-11-27 DIAGNOSIS — L989 Disorder of the skin and subcutaneous tissue, unspecified: Secondary | ICD-10-CM | POA: Diagnosis not present

## 2023-11-27 DIAGNOSIS — C61 Malignant neoplasm of prostate: Secondary | ICD-10-CM | POA: Diagnosis not present

## 2024-01-02 ENCOUNTER — Other Ambulatory Visit: Payer: Self-pay | Admitting: Nurse Practitioner

## 2024-01-22 DIAGNOSIS — G4733 Obstructive sleep apnea (adult) (pediatric): Secondary | ICD-10-CM | POA: Diagnosis not present

## 2024-02-12 ENCOUNTER — Other Ambulatory Visit: Payer: Self-pay | Admitting: Urology

## 2024-02-12 DIAGNOSIS — C61 Malignant neoplasm of prostate: Secondary | ICD-10-CM

## 2024-03-12 ENCOUNTER — Other Ambulatory Visit: Payer: Self-pay | Admitting: Nurse Practitioner

## 2024-03-16 DIAGNOSIS — E785 Hyperlipidemia, unspecified: Secondary | ICD-10-CM | POA: Diagnosis not present

## 2024-03-16 DIAGNOSIS — G720 Drug-induced myopathy: Secondary | ICD-10-CM | POA: Diagnosis not present

## 2024-03-16 DIAGNOSIS — Z9181 History of falling: Secondary | ICD-10-CM | POA: Diagnosis not present

## 2024-03-16 DIAGNOSIS — E1169 Type 2 diabetes mellitus with other specified complication: Secondary | ICD-10-CM | POA: Diagnosis not present

## 2024-03-16 DIAGNOSIS — Z1331 Encounter for screening for depression: Secondary | ICD-10-CM | POA: Diagnosis not present

## 2024-03-16 DIAGNOSIS — I5032 Chronic diastolic (congestive) heart failure: Secondary | ICD-10-CM | POA: Diagnosis not present

## 2024-03-16 DIAGNOSIS — C61 Malignant neoplasm of prostate: Secondary | ICD-10-CM | POA: Diagnosis not present

## 2024-03-16 DIAGNOSIS — Z0000 Encounter for general adult medical examination without abnormal findings: Secondary | ICD-10-CM | POA: Diagnosis not present

## 2024-03-16 DIAGNOSIS — Z1339 Encounter for screening examination for other mental health and behavioral disorders: Secondary | ICD-10-CM | POA: Diagnosis not present

## 2024-03-16 DIAGNOSIS — J9611 Chronic respiratory failure with hypoxia: Secondary | ICD-10-CM | POA: Diagnosis not present

## 2024-03-16 DIAGNOSIS — I480 Paroxysmal atrial fibrillation: Secondary | ICD-10-CM | POA: Diagnosis not present

## 2024-03-23 ENCOUNTER — Inpatient Hospital Stay
Admission: RE | Admit: 2024-03-23 | Discharge: 2024-03-23 | Disposition: A | Source: Ambulatory Visit | Attending: Urology | Admitting: Urology

## 2024-03-23 DIAGNOSIS — C61 Malignant neoplasm of prostate: Secondary | ICD-10-CM

## 2024-03-23 MED ORDER — GADOPICLENOL 0.5 MMOL/ML IV SOLN
10.0000 mL | Freq: Once | INTRAVENOUS | Status: AC | PRN
Start: 2024-03-23 — End: 2024-03-23
  Administered 2024-03-23: 10 mL via INTRAVENOUS

## 2024-03-30 DIAGNOSIS — C61 Malignant neoplasm of prostate: Secondary | ICD-10-CM | POA: Diagnosis not present

## 2024-04-02 ENCOUNTER — Other Ambulatory Visit: Payer: Self-pay | Admitting: Nurse Practitioner

## 2024-04-06 DIAGNOSIS — N401 Enlarged prostate with lower urinary tract symptoms: Secondary | ICD-10-CM | POA: Diagnosis not present

## 2024-04-06 DIAGNOSIS — R351 Nocturia: Secondary | ICD-10-CM | POA: Diagnosis not present

## 2024-04-06 DIAGNOSIS — C61 Malignant neoplasm of prostate: Secondary | ICD-10-CM | POA: Diagnosis not present

## 2024-04-19 ENCOUNTER — Other Ambulatory Visit: Payer: Self-pay | Admitting: Cardiovascular Disease

## 2024-04-21 DIAGNOSIS — Z23 Encounter for immunization: Secondary | ICD-10-CM | POA: Diagnosis not present

## 2024-08-19 ENCOUNTER — Ambulatory Visit: Admitting: Physician Assistant
